# Patient Record
Sex: Female | Born: 1960 | Race: White | Hispanic: No | Marital: Married | Smoking: Never smoker
Health system: Southern US, Community
[De-identification: ages and names within clinical notes are randomized; demographics above are authoritative.]

## PROBLEM LIST (undated history)

## (undated) DIAGNOSIS — B029 Zoster without complications: Secondary | ICD-10-CM

## (undated) DIAGNOSIS — I639 Cerebral infarction, unspecified: Secondary | ICD-10-CM

## (undated) DIAGNOSIS — F329 Major depressive disorder, single episode, unspecified: Secondary | ICD-10-CM

## (undated) DIAGNOSIS — F419 Anxiety disorder, unspecified: Secondary | ICD-10-CM

## (undated) DIAGNOSIS — K115 Sialolithiasis: Secondary | ICD-10-CM

## (undated) DIAGNOSIS — K219 Gastro-esophageal reflux disease without esophagitis: Secondary | ICD-10-CM

## (undated) DIAGNOSIS — R200 Anesthesia of skin: Secondary | ICD-10-CM

## (undated) DIAGNOSIS — F32A Depression, unspecified: Secondary | ICD-10-CM

## (undated) DIAGNOSIS — T7840XA Allergy, unspecified, initial encounter: Secondary | ICD-10-CM

## (undated) DIAGNOSIS — E079 Disorder of thyroid, unspecified: Secondary | ICD-10-CM

## (undated) HISTORY — DX: Anesthesia of skin: R20.0

## (undated) HISTORY — DX: Sialolithiasis: K11.5

## (undated) HISTORY — DX: Cerebral infarction, unspecified: I63.9

## (undated) HISTORY — DX: Depression, unspecified: F32.A

## (undated) HISTORY — DX: Zoster without complications: B02.9

## (undated) HISTORY — DX: Gastro-esophageal reflux disease without esophagitis: K21.9

## (undated) HISTORY — DX: Allergy, unspecified, initial encounter: T78.40XA

## (undated) HISTORY — PX: TOOTH EXTRACTION: SUR596

## (undated) HISTORY — DX: Disorder of thyroid, unspecified: E07.9

## (undated) HISTORY — DX: Major depressive disorder, single episode, unspecified: F32.9

## (undated) HISTORY — DX: Anxiety disorder, unspecified: F41.9

---

## 1996-07-01 HISTORY — PX: ABDOMINAL HYSTERECTOMY: SHX81

## 2013-12-29 DIAGNOSIS — I639 Cerebral infarction, unspecified: Secondary | ICD-10-CM

## 2013-12-29 DIAGNOSIS — G459 Transient cerebral ischemic attack, unspecified: Secondary | ICD-10-CM

## 2013-12-29 HISTORY — DX: Transient cerebral ischemic attack, unspecified: G45.9

## 2013-12-29 HISTORY — DX: Cerebral infarction, unspecified: I63.9

## 2015-12-30 ENCOUNTER — Encounter (INDEPENDENT_AMBULATORY_CARE_PROVIDER_SITE_OTHER): Payer: Self-pay

## 2015-12-30 ENCOUNTER — Encounter: Payer: Self-pay | Admitting: *Deleted

## 2015-12-30 ENCOUNTER — Encounter: Payer: Self-pay | Admitting: Family Medicine

## 2015-12-30 ENCOUNTER — Ambulatory Visit (INDEPENDENT_AMBULATORY_CARE_PROVIDER_SITE_OTHER): Admitting: Family Medicine

## 2015-12-30 VITALS — BP 129/69 | HR 66 | Temp 97.0°F | Ht 65.75 in | Wt 163.0 lb

## 2015-12-30 DIAGNOSIS — E079 Disorder of thyroid, unspecified: Secondary | ICD-10-CM

## 2015-12-30 DIAGNOSIS — Z0000 Encounter for general adult medical examination without abnormal findings: Secondary | ICD-10-CM

## 2015-12-30 NOTE — Patient Instructions (Signed)
TRY otc    SELSYUM BLUE OR NIZORAL SHAMPOO   ALLEGRA OR ZYRTEC  MIRA LAX, METAMUCIL, OR COLACE

## 2015-12-30 NOTE — Progress Notes (Signed)
Subjective:    Patient ID: Annette Silva, female    DOB: 10-Feb-1961, 55 y.o.   MRN: [id number]  HPI Patient is here today for annual wellness exam and follow up of chronic medical problems which includes thyroid disease. She is a New patient here at our practice.  She moved from [place] about one year ago to be near family here. She is sad about that move having regrets about leaving her former life. The family thing is just not worked out that her hopes. She does have a history of hypothyroidism but has been off of thyroid supplement for some months now this may be related to some of her symptoms such as fatigue and dry scalp.  Fatigue also made the related to poor sleep. She wakes up many times at night and cannot go back to sleep. She becomes tearful when I ask about sadness and depression and the aforementioned move.  She also complains today of some hearing loss and tinnitus. This has been a chronic problem. 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  She complains of cough and thinks it may be related to allergies. She has not tried any antihistamine or other medicines.  Complains of hemorrhoids some occasional slight bleeding and pain she has never been told formally that this is the problem.  Complains of pain in her right elbow and both knees. She does no repetitive motions. Her knees make no creaking or cracking noises with steps and bending.      There are no active problems to display for this patient.  No outpatient encounter prescriptions on file as of 12/30/2015.   No facility-administered encounter medications on file as of 12/30/2015.      Review of Systems  Constitutional: Positive for fatigue.  HENT: Negative.        Tinnitus and hearing loss in both ears  Eyes: Negative.   Respiratory: Positive for cough (? allergies / pollen -- non productive).   Cardiovascular: Negative.   Gastrointestinal: Positive for rectal pain.  Endocrine:  Negative.   Genitourinary: Negative.   Musculoskeletal: Positive for arthralgias (right elbow pain  and   bilateral knee pain with bending).  Skin: Negative.        Dry patch - scalp  Allergic/Immunologic: Negative.   Neurological: Negative.   Hematological: Negative.   Psychiatric/Behavioral: Negative.        Objective:   Physical Exam  Constitutional: She is oriented to person, place, and time. She appears well-developed and well-nourished.  HENT:  Head: Normocephalic.  Right Ear: External ear normal.  Left Ear: External ear normal.  Mouth/Throat: Oropharynx is clear and moist.  Eyes: Pupils are equal, round, and reactive to light.  Neck: Normal range of motion.  Cardiovascular: Normal rate, regular rhythm and normal heart sounds.   Pulmonary/Chest: Effort normal and breath sounds normal.  Abdominal: Soft. Bowel sounds are normal.  Genitourinary:  Rectal exam does show some small external hemorrhoids of the 6:00 position  Musculoskeletal:  There is tenderness in the right elbow over the lateral epicondyles.  Knees are tender with movement of patella suggestive of a patellofemoral syndrome  Neurological: She is alert and oriented to person, place, and time. She displays abnormal reflex.  Skin:  Small patch and scalp in the frontal area consistent with seborrheic dermatitis  Psychiatric: She has a normal mood and affect. Her behavior is normal.   BP 129/69 mmHg  Pulse 66  Temp(Src) 97 F (36.1  C) (Oral)  Ht 5' 5.75" (1.67 m)  Wt 163 lb (73.936 kg)  BMI 26.51 kg/m2  LMP 07/01/1996        Assessment & Plan:  1. Annual physical exam Exam completed. Will check thyroid as well as CBC CMP and lipids. For the fatigue have asked her to think about a sleeping aid which might include Xanax, which might also help the tinnitus. Protocol 5 ask her to try OTC antihistamine such as fexofenadine or loratadine. For hemorrhoids I suggested MiraLAX or Metamucil and Anusol  cream. For the elbow I suggested a tennis elbow strap and Aleve. Aleve may also help the patellofemoral syndrome. For the dry patch in the scalp I suggested Selsun Blue or Nizoral shampoo - Lipid panel - CMP14+EGFR - TSH  2. Thyroid disease We'll probably restart her thyroid hormone supplement depending on  test result - TSH  Wardell Honour MD

## 2015-12-31 LAB — CBC WITH DIFFERENTIAL/PLATELET
BASOS: 1 %
Basophils Absolute: 0 10*3/uL (ref 0.0–0.2)
EOS (ABSOLUTE): 0.1 10*3/uL (ref 0.0–0.4)
EOS: 1 %
HEMATOCRIT: 40.2 % (ref 34.0–46.6)
Hemoglobin: 13.4 g/dL (ref 11.1–15.9)
IMMATURE GRANS (ABS): 0 10*3/uL (ref 0.0–0.1)
IMMATURE GRANULOCYTES: 0 %
Lymphocytes Absolute: 2 10*3/uL (ref 0.7–3.1)
Lymphs: 43 %
MCH: 28.3 pg (ref 26.6–33.0)
MCHC: 33.3 g/dL (ref 31.5–35.7)
MCV: 85 fL (ref 79–97)
MONOS ABS: 0.3 10*3/uL (ref 0.1–0.9)
Monocytes: 6 %
NEUTROS ABS: 2.2 10*3/uL (ref 1.4–7.0)
NEUTROS PCT: 49 %
PLATELETS: 217 10*3/uL (ref 150–379)
RBC: 4.73 x10E6/uL (ref 3.77–5.28)
RDW: 14.3 % (ref 12.3–15.4)
WBC: 4.6 10*3/uL (ref 3.4–10.8)

## 2015-12-31 LAB — CMP14+EGFR
A/G RATIO: 2 (ref 1.2–2.2)
ALBUMIN: 4.5 g/dL (ref 3.5–5.5)
ALT: 9 IU/L (ref 0–32)
AST: 14 IU/L (ref 0–40)
Alkaline Phosphatase: 79 IU/L (ref 39–117)
BILIRUBIN TOTAL: 0.4 mg/dL (ref 0.0–1.2)
BUN / CREAT RATIO: 16 (ref 9–23)
BUN: 12 mg/dL (ref 6–24)
CALCIUM: 9.2 mg/dL (ref 8.7–10.2)
CHLORIDE: 100 mmol/L (ref 96–106)
CO2: 27 mmol/L (ref 18–29)
Creatinine, Ser: 0.74 mg/dL (ref 0.57–1.00)
GFR, EST AFRICAN AMERICAN: 105 mL/min/{1.73_m2} (ref >59)
GFR, EST NON AFRICAN AMERICAN: 91 mL/min/{1.73_m2} (ref >59)
GLOBULIN, TOTAL: 2.2 g/dL (ref 1.5–4.5)
Glucose: 84 mg/dL (ref 65–99)
POTASSIUM: 3.9 mmol/L (ref 3.5–5.2)
SODIUM: 142 mmol/L (ref 134–144)
TOTAL PROTEIN: 6.7 g/dL (ref 6.0–8.5)

## 2015-12-31 LAB — LIPID PANEL
CHOL/HDL RATIO: 2.4 ratio (ref 0.0–4.4)
CHOLESTEROL TOTAL: 204 mg/dL — AB (ref 100–199)
HDL: 84 mg/dL (ref >39)
LDL Calculated: 110 mg/dL — ABNORMAL HIGH (ref 0–99)
TRIGLYCERIDES: 49 mg/dL (ref 0–149)
VLDL Cholesterol Cal: 10 mg/dL (ref 5–40)

## 2015-12-31 LAB — TSH: TSH: 10.51 u[IU]/mL — ABNORMAL HIGH (ref 0.450–4.500)

## 2016-01-01 MED ORDER — LEVOTHYROXINE SODIUM 50 MCG PO TABS: 50.0000 ug | ORAL_TABLET | Freq: Every day | ORAL | Status: DC

## 2016-01-01 NOTE — Addendum Note (Signed)
Addended by: Shelbie Ammons on: 01/01/2016 03:41 PM   Modules accepted: Orders, SmartSet

## 2016-02-20 ENCOUNTER — Ambulatory Visit (INDEPENDENT_AMBULATORY_CARE_PROVIDER_SITE_OTHER): Admitting: Family

## 2016-02-20 ENCOUNTER — Encounter: Payer: Self-pay | Admitting: Family

## 2016-02-20 VITALS — BP 111/73 | HR 59 | Temp 98.0°F | Ht 65.75 in | Wt 161.8 lb

## 2016-02-20 DIAGNOSIS — D229 Melanocytic nevi, unspecified: Secondary | ICD-10-CM | POA: Diagnosis not present

## 2016-02-20 DIAGNOSIS — L821 Other seborrheic keratosis: Secondary | ICD-10-CM

## 2016-02-20 DIAGNOSIS — E663 Overweight: Secondary | ICD-10-CM | POA: Diagnosis not present

## 2016-02-20 NOTE — Patient Instructions (Signed)
Seborrheic Keratosis Seborrheic keratosis is a common, noncancerous (benign) skin growth. This condition causes waxy, rough, tan, brown, or black spots to appear on the skin. These skin growths can be flat or raised. CAUSES The cause of this condition is not known. RISK FACTORS This condition is more likely to develop in:  People who have a family history of seborrheic keratosis.  People who are 50 or older.  People who are pregnant.  People who have had estrogen replacement therapy. SYMPTOMS This condition often occurs on the face, chest, shoulders, back, or other areas. These growths:  Are usually painless, but may become irritated and itchy.  Can be yellow, brown, black, or other colors.  Are slightly raised or have a flat surface.  Are sometimes rough or wart-like in texture.  Are often waxy on the surface.  Are round or oval-shaped.  Sometimes look like they are "stuck on."  Often occur in groups, but may occur as a single growth. DIAGNOSIS This condition is diagnosed with a medical history and physical exam. A sample of the growth may be tested (skin biopsy). You may need to see a skin specialist (dermatologist). TREATMENT Treatment is not usually needed for this condition, unless the growths are irritated or are often bleeding. You may also choose to have the growths removed if you do not like their appearance. Most commonly, these growths are treated with a procedure in which liquid nitrogen is applied to "freeze" off the growth (cryosurgery). They may also be burned off with electricity or cut off. HOME CARE INSTRUCTIONS  Watch your growth for any changes.  Keep all follow-up visits as told by your health care provider. This is important.  Do not scratch or pick at the growth or growths. This can cause them to become irritated or infected. SEEK MEDICAL CARE IF:  You suddenly have many new growths.  Your growth bleeds, itches, or hurts.  Your growth suddenly  becomes larger or changes color.   This information is not intended to replace advice given to you by your health care provider. Make sure you discuss any questions you have with your health care provider.   Document Released: 09/19/2010 Document Revised: 05/08/2015 Document Reviewed: 01/02/2015 Elsevier Interactive Patient Education 2016 [employer]  

## 2016-02-20 NOTE — Progress Notes (Signed)
   Subjective:    Patient ID: Annette Silva, female    DOB: Nov 05, 1960, 55 y.o.   MRN: [id number]  HPI Pt presents to the office today for "brown spots' that she noticed over the last year. PT states they have become larger. Pt states she noticed one appear on her left side last week. PT denies any history of skin cancer. Pt denies any headache, palpitations, SOB, or edema at this time.     Review of Systems  Constitutional: Negative.   HENT: Negative.   Eyes: Negative.   Respiratory: Negative.  Negative for shortness of breath.   Cardiovascular: Negative.  Negative for palpitations.  Gastrointestinal: Negative.   Endocrine: Negative.   Genitourinary: Negative.   Musculoskeletal: Negative.   Neurological: Negative.  Negative for headaches.  Hematological: Negative.   Psychiatric/Behavioral: Negative.   All other systems reviewed and are negative.      Objective:   Physical Exam  Constitutional: She is oriented to person, place, and time. She appears well-developed and well-nourished. No distress.  HENT:  Head: Normocephalic and atraumatic.  Eyes: Pupils are equal, round, and reactive to light.  Neck: Normal range of motion. Neck supple. No thyromegaly present.  Cardiovascular: Normal rate, regular rhythm, normal heart sounds and intact distal pulses.   No murmur heard. Pulmonary/Chest: Effort normal and breath sounds normal. No respiratory distress. She has no wheezes.  Abdominal: Soft. Bowel sounds are normal. She exhibits no distension. There is no tenderness.  Musculoskeletal: Normal range of motion. She exhibits no edema or tenderness.  Neurological: She is alert and oriented to person, place, and time.  Skin: Skin is warm and dry.  Mole present on left lateral thigh,Seborrheic Keratosis lesion present on left abdominal area   Psychiatric: She has a normal mood and affect. Her behavior is normal. Judgment and thought content normal.  Vitals reviewed.     BP 111/73  mmHg  Pulse 59  Temp(Src) 98 F (36.7 C) (Oral)  Ht 5' 5.75" (1.67 m)  Wt 161 lb 12.8 oz (73.392 kg)  BMI 26.32 kg/m2  LMP 07/01/1996     Assessment & Plan:  1. Overweight (BMI 25.0-29.9)  2. Seborrheic keratosis  3. Benign mole  Pt states she would like to hold off on removing these at this time.  Discussed importance of watching and report any change in color or shape Do not pick at  RTO prn   Evelina Dun, FNP

## 2016-02-25 ENCOUNTER — Other Ambulatory Visit

## 2016-02-26 ENCOUNTER — Other Ambulatory Visit: Payer: Self-pay | Admitting: *Deleted

## 2016-02-26 ENCOUNTER — Other Ambulatory Visit

## 2016-02-26 DIAGNOSIS — E079 Disorder of thyroid, unspecified: Secondary | ICD-10-CM

## 2016-02-27 LAB — TSH: TSH: 3.7 u[IU]/mL (ref 0.450–4.500)

## 2016-04-20 ENCOUNTER — Encounter: Payer: Self-pay | Admitting: Pediatrics

## 2016-04-20 ENCOUNTER — Ambulatory Visit (INDEPENDENT_AMBULATORY_CARE_PROVIDER_SITE_OTHER): Admitting: Pediatrics

## 2016-04-20 DIAGNOSIS — N941 Unspecified dyspareunia: Secondary | ICD-10-CM

## 2016-04-20 DIAGNOSIS — N898 Other specified noninflammatory disorders of vagina: Secondary | ICD-10-CM

## 2016-04-20 DIAGNOSIS — E039 Hypothyroidism, unspecified: Secondary | ICD-10-CM

## 2016-04-20 DIAGNOSIS — Z01419 Encounter for gynecological examination (general) (routine) without abnormal findings: Secondary | ICD-10-CM

## 2016-04-20 LAB — WET PREP FOR TRICH, YEAST, CLUE
CLUE CELL EXAM: NEGATIVE
Trichomonas Exam: NEGATIVE
YEAST EXAM: NEGATIVE

## 2016-04-20 MED ORDER — LEVOTHYROXINE SODIUM 50 MCG PO TABS: 50.0000 ug | ORAL_TABLET | Freq: Every day | ORAL | 3 refills | Status: DC

## 2016-04-20 NOTE — Progress Notes (Signed)
  Subjective:   Patient ID: Annette Silva, female    DOB: 1961/06/24, 55 y.o.   MRN: [id number] CC: Gynecologic Exam  HPI: Annette Silva is a 55 y.o. female presenting for Gynecologic Exam  Partial hysterectomy for heavy bleeding in 1990s Has had pap smears since then, is not sure if she had cervix removed No h/o abnormal paps Pain with intercourse Last sexual activity at least 5 years ago, stopped because of pain No vaginal discharge, no fevers No odors, no bleeding  Relevant past medical, surgical, family and social history reviewed. Allergies and medications reviewed and updated. History  Smoking Status  . Never Smoker  Smokeless Tobacco  . Never Used   Family History  Problem Relation Age of Onset  . Stroke Mother     passed at age 87  . Arthritis Mother   . COPD Mother   . Depression Mother   . Diabetes Mother   . Hearing loss Mother   . Heart disease Mother   . Hypertension Mother   . Cancer Father     bone passed at 7    ROS: Per HPI   Objective:    BP (P) 106/63 (BP Location: Left Arm, Patient Position: Sitting, Cuff Size: Normal)   Pulse (P) 68   Temp (P) 99.1 F (37.3 C) (Oral)   Ht (P) 5' 5.75" (1.67 m)   Wt (P) 159 lb 3.2 oz (72.2 kg)   LMP 07/01/1996   BMI (P) 25.89 kg/m   Wt Readings from Last 3 Encounters:  04/20/16 (P) 159 lb 3.2 oz (72.2 kg)  02/20/16 161 lb 12.8 oz (73.4 kg)  12/30/15 163 lb (73.9 kg)   Gen: NAD, alert, cooperative with exam, NCAT EYES: EOMI, no conjunctival injection, or no icterus CV: NRRR, normal S1/S2, no murmur, distal pulses 2+ b/l Resp: CTABL, no wheezes, normal WOB Abd: +BS, soft, NTND. no guarding or organomegaly Ext: No edema, warm Breast: normal exam Neuro: Alert and oriented, strength equal b/l UE and LE, coordination grossly normal MSK: normal muscle bulk GU: thick yellow mucus present at end fo vaginal vault. No cervix present  Assessment & Plan:  Annette Silva was seen today for gynecologic exam.  Diagnoses  and all orders for this visit:  Encounter for gynecological examination No cervix present, does not need further pap smears for screening  Dyspareunia Ongoing several years Discussed options including OTC replens, vagisil, vaginal estrogen Pt not interested now, will let me know  Hypothyroidism, unspecified hypothyroidism type TSH appropraite, repeat in 9 mo Continue below -     levothyroxine (SYNTHROID, LEVOTHROID) 50 MCG tablet; Take 1 tablet (50 mcg total) by mouth daily before breakfast.  Vaginal discharge Yellow discharge vaginal vault No symptoms Not sexually active now -     [place], YEAST, CLUE  Mammogram: scheduled later this week  Follow up plan: Return in about 9 months (around 01/29/2017). Assunta Found, MD [place]

## 2016-04-22 ENCOUNTER — Encounter: Admitting: *Deleted

## 2016-10-20 DIAGNOSIS — K115 Sialolithiasis: Secondary | ICD-10-CM | POA: Insufficient documentation

## 2016-10-20 DIAGNOSIS — H9113 Presbycusis, bilateral: Secondary | ICD-10-CM | POA: Insufficient documentation

## 2016-10-20 DIAGNOSIS — H9313 Tinnitus, bilateral: Secondary | ICD-10-CM | POA: Insufficient documentation

## 2017-02-12 ENCOUNTER — Encounter: Payer: Self-pay | Admitting: Family Medicine

## 2017-02-12 ENCOUNTER — Ambulatory Visit (INDEPENDENT_AMBULATORY_CARE_PROVIDER_SITE_OTHER): Admitting: Family Medicine

## 2017-02-12 VITALS — BP 103/78 | HR 65 | Temp 98.5°F | Ht 65.75 in | Wt 159.0 lb

## 2017-02-12 DIAGNOSIS — R0789 Other chest pain: Secondary | ICD-10-CM | POA: Diagnosis not present

## 2017-02-12 DIAGNOSIS — J3089 Other allergic rhinitis: Secondary | ICD-10-CM

## 2017-02-12 NOTE — Progress Notes (Signed)
BP 103/78   Pulse 65   Temp 98.5 F (36.9 C) (Oral)   Ht 5' 5.75" (1.67 m)   Wt 159 lb (72.1 kg)   LMP 07/01/1996   BMI 25.86 kg/m    Subjective:    Patient ID: Annette Silva, female    DOB: 19-Oct-1960, 56 y.o.   MRN: [id number]  HPI: Annette Silva is a 56 y.o. female presenting on 02/12/2017 for Hoarse and Cough (first started getting sick on 5/21, started Alka Seltzer plus cold, thought she was improving, but woke up like this today)   HPI  Hoarseness and drainage Patient started having a little bit of congestion last night and then woke up with a hoarse throat this morning. She had a cough on 5/21 but took medication and that went away for good time but then last night she started getting ill again. She started feeling not well last night and then this morning she started in with a hoarseness in her throat. She denies any sore throat or cough. She has been having a little bit of drainage and sinus congestion. She denies any fevers or chills or shortness of breath or wheezing. She has not used anything since yesterday for this yet. She also says that since this morning she has been complaining of some chest tightness and pressure. She denies any shortness of breath along with it. The chest pressure has been there since early this morning and it is now 1:20 in the afternoon.  Relevant past medical, surgical, family and social history reviewed and updated as indicated. Interim medical history since our last visit reviewed. Allergies and medications reviewed and updated.  Review of Systems  Constitutional: Negative for chills and fever.  HENT: Positive for congestion, postnasal drip, sinus pressure, sneezing and voice change. Negative for ear discharge, ear pain, rhinorrhea and sore throat.   Eyes: Negative for pain, redness and visual disturbance.  Respiratory: Positive for cough. Negative for chest tightness and shortness of breath.   Cardiovascular: Positive for chest pain. Negative  for leg swelling.  Genitourinary: Negative for difficulty urinating and dysuria.  Musculoskeletal: Negative for back pain and gait problem.  Skin: Negative for rash.  Neurological: Negative for light-headedness and headaches.  Psychiatric/Behavioral: Negative for agitation and behavioral problems.  All other systems reviewed and are negative.   Per HPI unless specifically indicated above        Objective:    BP 103/78   Pulse 65   Temp 98.5 F (36.9 C) (Oral)   Ht 5' 5.75" (1.67 m)   Wt 159 lb (72.1 kg)   LMP 07/01/1996   BMI 25.86 kg/m   Wt Readings from Last 3 Encounters:  02/12/17 159 lb (72.1 kg)  04/20/16 (P) 159 lb 3.2 oz (72.2 kg)  02/20/16 161 lb 12.8 oz (73.4 kg)    Physical Exam  Constitutional: She is oriented to person, place, and time. She appears well-developed and well-nourished. No distress.  HENT:  Right Ear: Tympanic membrane, external ear and ear canal normal.  Left Ear: Tympanic membrane, external ear and ear canal normal.  Nose: Mucosal edema and rhinorrhea present. No epistaxis. Right sinus exhibits no maxillary sinus tenderness and no frontal sinus tenderness. Left sinus exhibits no maxillary sinus tenderness and no frontal sinus tenderness.  Mouth/Throat: Uvula is midline and mucous membranes are normal. Posterior oropharyngeal edema and posterior oropharyngeal erythema present. No oropharyngeal exudate or tonsillar abscesses.  Eyes: Conjunctivae are normal.  Neck: Neck supple. No thyromegaly present.  Cardiovascular: Normal rate, regular rhythm, normal heart sounds and intact distal pulses.   No murmur heard. Pulmonary/Chest: Effort normal and breath sounds normal. No respiratory distress. She has no wheezes. She has no rales.  Musculoskeletal: Normal range of motion.  Lymphadenopathy:    She has no cervical adenopathy.  Neurological: She is alert and oriented to person, place, and time. Coordination normal.  Skin: Skin is warm and dry. No rash  noted. She is not diaphoretic.  Psychiatric: She has a normal mood and affect. Her behavior is normal.  Nursing note and vitals reviewed.   EKG: Normal sinus rhythm    Assessment & Plan:   Problem List Items Addressed This Visit    None    Visit Diagnoses    Chest pressure    -  Primary   Relevant Orders   EKG 12-Lead   Seasonal allergic rhinitis due to other allergic trigger       Mucinex and Flonase and nasal saline spray       Follow up plan: Return if symptoms worsen or fail to improve.  Counseling provided for all of the vaccine components Orders Placed This Encounter  Procedures  . EKG 12-Lead    Caryl Pina, MD [facility] 02/12/2017, 1:26 PM

## 2017-03-25 ENCOUNTER — Ambulatory Visit (INDEPENDENT_AMBULATORY_CARE_PROVIDER_SITE_OTHER): Admitting: Pediatrics

## 2017-03-25 ENCOUNTER — Encounter: Payer: Self-pay | Admitting: Pediatrics

## 2017-03-25 VITALS — BP 118/75 | HR 65 | Temp 98.1°F | Ht 65.75 in | Wt 159.6 lb

## 2017-03-25 DIAGNOSIS — Z0000 Encounter for general adult medical examination without abnormal findings: Secondary | ICD-10-CM

## 2017-03-25 DIAGNOSIS — Z780 Asymptomatic menopausal state: Secondary | ICD-10-CM

## 2017-03-25 DIAGNOSIS — E039 Hypothyroidism, unspecified: Secondary | ICD-10-CM

## 2017-03-25 NOTE — Progress Notes (Signed)
  Subjective:   Patient ID: Annette Silva, female    DOB: 1960/12/07, 56 y.o.   MRN: [id number] CC: annual HPI: Annette Silva is a 56 y.o. female presenting for Jaw Pain; Ear Pain; and annual  Had tooth pain starting 3 weeks ago Had tooth extraction last week Tooth had broken under the gum Now tooth pain improving No further ear pain Concerned about her bones because of broken tooth  Last bone density in Nov 2013 in [place] hx in father of bone cancer in 1975/11/18, died the day before starting chemotherapy Mother with hip fracture in her 72s  No fevers Never been on steroids for long periods of time Follows with chiropractor for her back pain, has beenstable  Relevant past medical, surgical, family and social history reviewed. Allergies and medications reviewed and updated. History  Smoking Status  . Never Smoker  Smokeless Tobacco  . Never Used   ROS: All systems negative other than what is in HPI  Objective:    BP 118/75   Pulse 65   Temp 98.1 F (36.7 C) (Oral)   Ht 5' 5.75" (1.67 m)   Wt 159 lb 9.6 oz (72.4 kg)   LMP 07/01/1996   BMI 25.96 kg/m   Wt Readings from Last 3 Encounters:  03/25/17 159 lb 9.6 oz (72.4 kg)  02/12/17 159 lb (72.1 kg)  04/20/16 (P) 159 lb 3.2 oz (72.2 kg)    Gen: NAD, alert, cooperative with exam, NCAT EYES: EOMI, no conjunctival injection, or no icterus ENT:  TMs pearly gray b/l, OP without erythema LYMPH: no cervical LAD CV: NRRR, normal S1/S2, no murmur, distal pulses 2+ b/l Resp: CTABL, no wheezes, normal WOB Abd: +BS, soft, NTND. no guarding or organomegaly Ext: No edema, warm Neuro: Alert and oriented, strength equal b/l UE and LE, coordination grossly normal MSK: normal muscle bulk  Declined breast exam  Assessment & Plan:  Christol was seen today for annual. Jaw pain and ear pain resolved after recent tooth extraction Have been healing well  Diagnoses and all orders for this visit:  Encounter for preventive  care mammo--doine last year Colonoscopy--done in November 17, 2009 in [place], was normal per pt Pap--s/p hysterectomy -     CBC with Differential/Platelet -     BMP8+EGFR  Hypothyroidism, unspecified type -     TSH  Post-menopausal -     DG [facility] DEXA   Follow up plan: 1 yr Assunta Found, MD [place]

## 2017-03-26 LAB — BMP8+EGFR
BUN/Creatinine Ratio: 12 (ref 9–23)
BUN: 10 mg/dL (ref 6–24)
CALCIUM: 9.5 mg/dL (ref 8.7–10.2)
CHLORIDE: 103 mmol/L (ref 96–106)
CO2: 26 mmol/L (ref 20–29)
Creatinine, Ser: 0.85 mg/dL (ref 0.57–1.00)
GFR, EST AFRICAN AMERICAN: 87 mL/min/{1.73_m2} (ref >59)
GFR, EST NON AFRICAN AMERICAN: 76 mL/min/{1.73_m2} (ref >59)
Glucose: 99 mg/dL (ref 65–99)
POTASSIUM: 4.1 mmol/L (ref 3.5–5.2)
SODIUM: 142 mmol/L (ref 134–144)

## 2017-03-26 LAB — CBC WITH DIFFERENTIAL/PLATELET
BASOS: 0 %
Basophils Absolute: 0 10*3/uL (ref 0.0–0.2)
EOS (ABSOLUTE): 0.1 10*3/uL (ref 0.0–0.4)
Eos: 1 %
Hematocrit: 40.8 % (ref 34.0–46.6)
Hemoglobin: 13.8 g/dL (ref 11.1–15.9)
IMMATURE GRANULOCYTES: 0 %
Immature Grans (Abs): 0 10*3/uL (ref 0.0–0.1)
LYMPHS ABS: 1.6 10*3/uL (ref 0.7–3.1)
Lymphs: 35 %
MCH: 28.6 pg (ref 26.6–33.0)
MCHC: 33.8 g/dL (ref 31.5–35.7)
MCV: 85 fL (ref 79–97)
MONOS ABS: 0.3 10*3/uL (ref 0.1–0.9)
Monocytes: 7 %
Neutrophils Absolute: 2.6 10*3/uL (ref 1.4–7.0)
Neutrophils: 57 %
PLATELETS: 261 10*3/uL (ref 150–379)
RBC: 4.83 x10E6/uL (ref 3.77–5.28)
RDW: 14 % (ref 12.3–15.4)
WBC: 4.6 10*3/uL (ref 3.4–10.8)

## 2017-03-26 LAB — TSH: TSH: 4.16 u[IU]/mL (ref 0.450–4.500)

## 2017-03-28 ENCOUNTER — Encounter: Payer: Self-pay | Admitting: Pediatrics

## 2017-03-29 MED ORDER — LEVOTHYROXINE SODIUM 50 MCG PO TABS: 50.0000 ug | ORAL_TABLET | Freq: Every day | ORAL | 1 refills | Status: DC

## 2017-04-01 ENCOUNTER — Ambulatory Visit (INDEPENDENT_AMBULATORY_CARE_PROVIDER_SITE_OTHER)

## 2017-04-01 DIAGNOSIS — Z780 Asymptomatic menopausal state: Secondary | ICD-10-CM

## 2017-05-17 ENCOUNTER — Ambulatory Visit (INDEPENDENT_AMBULATORY_CARE_PROVIDER_SITE_OTHER): Admitting: Family Medicine

## 2017-05-17 ENCOUNTER — Encounter: Payer: Self-pay | Admitting: Family Medicine

## 2017-05-17 VITALS — BP 121/81 | HR 65 | Temp 97.3°F | Ht 65.75 in | Wt 162.4 lb

## 2017-05-17 DIAGNOSIS — B029 Zoster without complications: Secondary | ICD-10-CM | POA: Diagnosis not present

## 2017-05-17 MED ORDER — VALACYCLOVIR HCL 1 G PO TABS: 1000.0000 mg | ORAL_TABLET | Freq: Three times a day (TID) | ORAL | 0 refills | Status: DC

## 2017-05-17 NOTE — Progress Notes (Signed)
   HPI  Patient presents today here with painful red rash.  Patient noticed some irritation and burning of her left neck last night after she got home from church. She began to feel the area and noticed some bumps developing.  She states that she has had "internal shingles" previously and this is a characteristic pain of that. She describes burning and tingling type pain and worsening rash of the left posterior neck. Itching and redness seems to go down the left side of her spine down to mid thoracic back  No Fever, chills, sweats.   PMH: Smoking status noted ROS: Per HPI  Objective: BP 121/81   Pulse 65   Temp (!) 97.3 F (36.3 C) (Oral)   Ht 5' 5.75" (1.67 m)   Wt 162 lb 6.4 oz (73.7 kg)   LMP 07/01/1996   BMI 26.41 kg/m  Gen: NAD, alert, cooperative with exam HEENT: NCAT CV: RRR, good S1/S2, no murmur Resp: CTABL, no wheezes, non-labored Ext: No edema, warm Neuro: Alert and oriented, No gross deficits Skin:  Left posterior neck with thin line of erythematous papules developing starting of the left-side of midline extending over about 2 inches, no lesions down the back.  Assessment and plan:  # shingles Treat with valtrex, discussed usual course of illness.  RTC with any concerns   Meds ordered this encounter  Medications  . Calcium Carbonate-Vit D-Min (CALCIUM 1200 PO)    Sig: Take by mouth.  . Cholecalciferol (D3 ADULT PO)    Sig: Take 800 mg by mouth daily.  . valACYclovir (VALTREX) 1000 MG tablet    Sig: Take 1 tablet (1,000 mg total) by mouth 3 (three) times daily.    Dispense:  21 tablet    Refill:  0    Laroy Apple, MD [facility] 05/17/2017, 3:05 PM

## 2017-05-17 NOTE — Patient Instructions (Signed)
Great to meet you!   Shingles Shingles, which is also known as herpes zoster, is an infection that causes a painful skin rash and fluid-filled blisters. Shingles is not related to genital herpes, which is a sexually transmitted infection. Shingles only develops in people who:  Have had chickenpox.  Have received the chickenpox vaccine. (This is rare.)  What are the causes? Shingles is caused by varicella-zoster virus (VZV). This is the same virus that causes chickenpox. After exposure to VZV, the virus stays in the body in an inactive (dormant) state. Shingles develops if the virus reactivates. This can happen many years after the initial exposure to VZV. It is not known what causes this virus to reactivate. What increases the risk? People who have had chickenpox or received the chickenpox vaccine are at risk for shingles. Infection is more common in people who:  Are older than age 11.  Have a weakened defense (immune) system, such as those with HIV, AIDS, or cancer.  Are taking medicines that weaken the immune system, such as transplant medicines.  Are under great stress.  What are the signs or symptoms? Early symptoms of this condition include itching, tingling, and pain in an area on your skin. Pain may be described as burning, stabbing, or throbbing. A few days or weeks after symptoms start, a painful red rash appears, usually on one side of the body in a bandlike or beltlike pattern. The rash eventually turns into fluid-filled blisters that break open, scab over, and dry up in about 2-3 weeks. At any time during the infection, you may also develop:  A fever.  Chills.  A headache.  An upset stomach.  How is this diagnosed? This condition is diagnosed with a skin exam. Sometimes, skin or fluid samples are taken from the blisters before a diagnosis is made. These samples are examined under a microscope or sent to a lab for testing. How is this treated? There is no specific  cure for this condition. Your health care provider will probably prescribe medicines to help you manage pain, recover more quickly, and avoid long-term problems. Medicines may include:  Antiviral drugs.  Anti-inflammatory drugs.  Pain medicines.  If the area involved is on your face, you may be referred to a specialist, such as an eye doctor (ophthalmologist) or an ear, nose, and throat (ENT) doctor to help you avoid eye problems, chronic pain, or disability. Follow these instructions at home: Medicines  Take medicines only as directed by your health care provider.  Apply an anti-itch or numbing cream to the affected area as directed by your health care provider. Blister and Rash Care  Take a cool bath or apply cool compresses to the area of the rash or blisters as directed by your health care provider. This may help with pain and itching.  Keep your rash covered with a loose bandage (dressing). Wear loose-fitting clothing to help ease the pain of material rubbing against the rash.  Keep your rash and blisters clean with mild soap and cool water or as directed by your health care provider.  Check your rash every day for signs of infection. These include redness, swelling, and pain that lasts or increases.  Do not pick your blisters.  Do not scratch your rash. General instructions  Rest as directed by your health care provider.  Keep all follow-up visits as directed by your health care provider. This is important.  Until your blisters scab over, your infection can cause chickenpox in people who have  never had it or been vaccinated against it. To prevent this from happening, avoid contact with other people, especially: ? Babies. ? Pregnant women. ? Children who have eczema. ? Elderly people who have transplants. ? People who have chronic illnesses, such as leukemia or AIDS. Contact a health care provider if:  Your pain is not relieved with prescribed medicines.  Your pain  does not get better after the rash heals.  Your rash looks infected. Signs of infection include redness, swelling, and pain that lasts or increases. Get help right away if:  The rash is on your face or nose.  You have facial pain, pain around your eye area, or loss of feeling on one side of your face.  You have ear pain or you have ringing in your ear.  You have loss of taste.  Your condition gets worse. This information is not intended to replace advice given to you by your health care provider. Make sure you discuss any questions you have with your health care provider. Document Released: 08/17/2005 Document Revised: 04/12/2016 Document Reviewed: 06/28/2014 Elsevier Interactive Patient Education  2017 [employer].

## 2017-07-21 ENCOUNTER — Telehealth: Payer: Self-pay | Admitting: Pediatrics

## 2017-07-21 ENCOUNTER — Ambulatory Visit (INDEPENDENT_AMBULATORY_CARE_PROVIDER_SITE_OTHER): Admitting: Pediatrics

## 2017-07-21 ENCOUNTER — Encounter: Payer: Self-pay | Admitting: Pediatrics

## 2017-07-21 VITALS — BP 112/73 | HR 64 | Temp 98.5°F | Ht 65.75 in | Wt 156.0 lb

## 2017-07-21 DIAGNOSIS — R238 Other skin changes: Secondary | ICD-10-CM | POA: Diagnosis not present

## 2017-07-21 DIAGNOSIS — R233 Spontaneous ecchymoses: Secondary | ICD-10-CM

## 2017-07-21 DIAGNOSIS — N644 Mastodynia: Secondary | ICD-10-CM

## 2017-07-21 DIAGNOSIS — R5383 Other fatigue: Secondary | ICD-10-CM | POA: Diagnosis not present

## 2017-07-21 DIAGNOSIS — E039 Hypothyroidism, unspecified: Secondary | ICD-10-CM

## 2017-07-21 NOTE — Progress Notes (Signed)
  Subjective:   Patient ID: Annette Silva, female    DOB: 09-29-1960, 56 y.o.   MRN: [id number] CC: Breast Pain and Axillary pain  HPI: Annette Silva is a 56 y.o. female presenting for Breast Pain and Axillary pain  Ongoing for past 2 months has had breast pain "feels like weights under her arm pits" More over past month There most days, not there all the time, lasts for a few hours Not associated with any particular activity When she presses on it, feels sore  Has noticed two new bruises R inner thigh, doesn't remember hitting herself  Appetite down Has lost 16 lbs past few months with changing diet for husband's new diagnosis of diabetes Has been walking more regularly Last year was walking 3 mi at a time Now feels like body in general is heavy and tired after abou ta mile, walking about 2 miles total most days No pain, no shortness of breath Pausing doesn't help                                             Just wants to sit down, gets better after a few minutes Fatigue ongoing since start of new diet when they started walking regularly  Eating breakfast and dinner regularly Sometimes has granola bar lunch Walking usually in afternoon  Relevant past medical, surgical, family and social history reviewed. Allergies and medications reviewed and updated. Social History   Tobacco Use  Smoking Status Never Smoker  Smokeless Tobacco Never Used   ROS: Per HPI   Objective:    BP 112/73   Pulse 64   Temp 98.5 F (36.9 C) (Oral)   Ht 5' 5.75" (1.67 m)   Wt 156 lb (70.8 kg)   LMP 07/01/1996   BMI 25.37 kg/m   Wt Readings from Last 3 Encounters:  07/21/17 156 lb (70.8 kg)  05/17/17 162 lb 6.4 oz (73.7 kg)  03/25/17 159 lb 9.6 oz (72.4 kg)    Gen: NAD, alert, cooperative with exam, NCAT EYES: EOMI, no conjunctival injection, or no icterus ENT:  TMs pearly gray b/l, OP without erythema LYMPH: no cervical, inguinal or axillary LAD CV: NRRR, normal S1/S2, no murmur, distal  pulses 2+ b/l Resp: CTABL, no wheezes, normal WOB Abd: +BS, soft, NTND. no guarding or organomegaly Ext: No edema, warm Neuro: Alert and oriented, strength equal b/l UE and LE, coordination grossly normal MSK: normal muscle bulk Breast: ttp L lateral breast, R axilla Otherwise normal exam Skin: two apprx 1inch bruises, yellowing R inner thigh  Assessment & Plan:  Arisbel was seen today for breast pain and axillary pain.  Diagnoses and all orders for this visit:  Easy bruising Two new bruises R inner thigh, appear to be same age will check labs -     PT AND PTT -     CMP14+EGFR -     CBC with Differential/Platelet  Other fatigue Drastically changed diet recently, now skipping lunch often, walking in the afternoon before dinner Eat three meals daily, trail mix if needed for lunch  Soreness breast Diagnostic mammogram -     MM Digital Diagnostic Bilat; Future  Hypothyroidism, unspecified type Check TSH  Follow up plan: Return in about 2 months (around 09/20/2017). Assunta Found, MD [place]

## 2017-07-22 LAB — CMP14+EGFR
ALBUMIN: 4.5 g/dL (ref 3.5–5.5)
ALT: 15 IU/L (ref 0–32)
AST: 16 IU/L (ref 0–40)
Albumin/Globulin Ratio: 2.3 — ABNORMAL HIGH (ref 1.2–2.2)
Alkaline Phosphatase: 70 IU/L (ref 39–117)
BILIRUBIN TOTAL: 0.3 mg/dL (ref 0.0–1.2)
BUN / CREAT RATIO: 12 (ref 9–23)
BUN: 10 mg/dL (ref 6–24)
CHLORIDE: 104 mmol/L (ref 96–106)
CO2: 27 mmol/L (ref 20–29)
CREATININE: 0.84 mg/dL (ref 0.57–1.00)
Calcium: 9.4 mg/dL (ref 8.7–10.2)
GFR calc non Af Amer: 77 mL/min/{1.73_m2} (ref >59)
GFR, EST AFRICAN AMERICAN: 89 mL/min/{1.73_m2} (ref >59)
GLOBULIN, TOTAL: 2 g/dL (ref 1.5–4.5)
GLUCOSE: 78 mg/dL (ref 65–99)
Potassium: 4.3 mmol/L (ref 3.5–5.2)
Sodium: 146 mmol/L — ABNORMAL HIGH (ref 134–144)
TOTAL PROTEIN: 6.5 g/dL (ref 6.0–8.5)

## 2017-07-22 LAB — CBC WITH DIFFERENTIAL/PLATELET
Basophils Absolute: 0 10*3/uL (ref 0.0–0.2)
Basos: 0 %
EOS (ABSOLUTE): 0 10*3/uL (ref 0.0–0.4)
EOS: 1 %
HEMATOCRIT: 38.5 % (ref 34.0–46.6)
HEMOGLOBIN: 13.5 g/dL (ref 11.1–15.9)
Immature Grans (Abs): 0 10*3/uL (ref 0.0–0.1)
Immature Granulocytes: 0 %
LYMPHS ABS: 1.6 10*3/uL (ref 0.7–3.1)
Lymphs: 52 %
MCH: 29.9 pg (ref 26.6–33.0)
MCHC: 35.1 g/dL (ref 31.5–35.7)
MCV: 85 fL (ref 79–97)
MONOCYTES: 9 %
Monocytes Absolute: 0.3 10*3/uL (ref 0.1–0.9)
NEUTROS ABS: 1.2 10*3/uL — AB (ref 1.4–7.0)
Neutrophils: 38 %
Platelets: 209 10*3/uL (ref 150–379)
RBC: 4.51 x10E6/uL (ref 3.77–5.28)
RDW: 14.1 % (ref 12.3–15.4)
WBC: 3.2 10*3/uL — ABNORMAL LOW (ref 3.4–10.8)

## 2017-07-22 LAB — PT AND PTT
INR: 1 (ref 0.8–1.2)
Prothrombin Time: 10.5 s (ref 9.1–12.0)
aPTT: 27 s (ref 24–33)

## 2017-07-22 LAB — TSH: TSH: 3.51 u[IU]/mL (ref 0.450–4.500)

## 2017-07-23 ENCOUNTER — Other Ambulatory Visit: Payer: Self-pay | Admitting: Pediatrics

## 2017-07-23 DIAGNOSIS — N644 Mastodynia: Secondary | ICD-10-CM

## 2017-07-27 NOTE — Telephone Encounter (Signed)
Pt aware of appointment date/time 

## 2017-07-30 ENCOUNTER — Ambulatory Visit
Admission: RE | Admit: 2017-07-30 | Discharge: 2017-07-30 | Disposition: A | Discharge status: Home / Self Care | Source: Ambulatory Visit | Attending: Pediatrics | Admitting: Pediatrics

## 2017-07-30 DIAGNOSIS — R238 Other skin changes: Secondary | ICD-10-CM

## 2017-07-30 DIAGNOSIS — N644 Mastodynia: Secondary | ICD-10-CM

## 2017-07-30 DIAGNOSIS — R233 Spontaneous ecchymoses: Secondary | ICD-10-CM

## 2017-09-24 ENCOUNTER — Ambulatory Visit (INDEPENDENT_AMBULATORY_CARE_PROVIDER_SITE_OTHER): Admitting: Pediatrics

## 2017-09-24 ENCOUNTER — Encounter: Payer: Self-pay | Admitting: Pediatrics

## 2017-09-24 VITALS — BP 106/70 | HR 60 | Temp 97.4°F | Ht 65.75 in | Wt 147.0 lb

## 2017-09-24 DIAGNOSIS — Z1159 Encounter for screening for other viral diseases: Secondary | ICD-10-CM

## 2017-09-24 DIAGNOSIS — E039 Hypothyroidism, unspecified: Secondary | ICD-10-CM | POA: Diagnosis not present

## 2017-09-24 DIAGNOSIS — R5383 Other fatigue: Secondary | ICD-10-CM | POA: Diagnosis not present

## 2017-09-24 MED ORDER — LEVOTHYROXINE SODIUM 50 MCG PO TABS: 50.0000 ug | ORAL_TABLET | Freq: Every day | ORAL | 3 refills | Status: DC

## 2017-09-24 NOTE — Progress Notes (Signed)
  Subjective:   Patient ID: Annette Silva, female    DOB: [birth year], 57 y.o.   MRN: [id number] CC: Follow-up (2 month)  HPI: Annette Silva is a 57 y.o. female presenting for Follow-up (2 month)  Walking several days a week, has been pleased with improved exercise tolerance  Still with breast "heaviness", feels like she is hung under her arm pits, lasts for a few seconds to minutes. Comes on less than once a week. Had b/l mammogram with ultrasound taht was normal since last visit. Nothing she can think of brings on the feeling, happens randomly, not associated with exercise  Has been pleased with weight loss from lifestyle changes  Continues to feel tired at times, no excessive bleeding with recent tooth extraction. Says she is still worried about easy bruising, No bruises today.  Relevant past medical, surgical, family and social history reviewed. Allergies and medications reviewed and updated. Social History   Tobacco Use  Smoking Status Never Smoker  Smokeless Tobacco Never Used   ROS: Per HPI   Objective:    BP 106/70   Pulse 60   Temp (!) 97.4 F (36.3 C) (Oral)   Ht 5' 5.75" (1.67 m)   Wt 147 lb (66.7 kg)   LMP 07/01/1996   BMI 23.91 kg/m   Wt Readings from Last 3 Encounters:  09/24/17 147 lb (66.7 kg)  07/21/17 156 lb (70.8 kg)  05/17/17 162 lb 6.4 oz (73.7 kg)    Gen: NAD, alert, cooperative with exam, NCAT EYES: EOMI, no conjunctival injection, or no icterus ENT:  TMs pearly gray b/l, OP without erythema LYMPH: no cervical LAD CV: NRRR, normal S1/S2, no murmur, distal pulses 2+ b/l Resp: CTABL, no wheezes, normal WOB Abd: +BS, soft, NTND. no guarding or organomegaly Ext: No edema, warm Neuro: Alert and oriented, strength equal b/l UE and LE, coordination grossly normal MSK: normal muscle bulk  Assessment & Plan:  Annette Silva was seen today for follow-up med problems.  Diagnoses and all orders for this visit:  Other fatigue -     CBC with  Differential/Platelet -     CMP14+EGFR  Need for hepatitis C screening test -     Hepatitis C antibody  Hypothyroidism, unspecified type -     levothyroxine (SYNTHROID, LEVOTHROID) 50 MCG tablet; Take 1 tablet (50 mcg total) by mouth daily before breakfast.   Follow up plan: Return in about 6 months (around 03/24/2018). Assunta Found, MD [place]

## 2017-09-25 LAB — CMP14+EGFR
A/G RATIO: 2 (ref 1.2–2.2)
ALT: 17 IU/L (ref 0–32)
AST: 22 IU/L (ref 0–40)
Albumin: 4.5 g/dL (ref 3.5–5.5)
Alkaline Phosphatase: 75 IU/L (ref 39–117)
BILIRUBIN TOTAL: 0.3 mg/dL (ref 0.0–1.2)
BUN/Creatinine Ratio: 13 (ref 9–23)
BUN: 10 mg/dL (ref 6–24)
CALCIUM: 9.4 mg/dL (ref 8.7–10.2)
CHLORIDE: 103 mmol/L (ref 96–106)
CO2: 27 mmol/L (ref 20–29)
Creatinine, Ser: 0.8 mg/dL (ref 0.57–1.00)
GFR calc Af Amer: 94 mL/min/{1.73_m2} (ref >59)
GFR, EST NON AFRICAN AMERICAN: 82 mL/min/{1.73_m2} (ref >59)
GLOBULIN, TOTAL: 2.3 g/dL (ref 1.5–4.5)
Glucose: 79 mg/dL (ref 65–99)
POTASSIUM: 4 mmol/L (ref 3.5–5.2)
SODIUM: 143 mmol/L (ref 134–144)
Total Protein: 6.8 g/dL (ref 6.0–8.5)

## 2017-09-25 LAB — CBC WITH DIFFERENTIAL/PLATELET
BASOS: 0 %
Basophils Absolute: 0 10*3/uL (ref 0.0–0.2)
EOS (ABSOLUTE): 0.1 10*3/uL (ref 0.0–0.4)
EOS: 1 %
HEMATOCRIT: 38 % (ref 34.0–46.6)
Hemoglobin: 13.2 g/dL (ref 11.1–15.9)
IMMATURE GRANULOCYTES: 0 %
Immature Grans (Abs): 0 10*3/uL (ref 0.0–0.1)
LYMPHS ABS: 1.5 10*3/uL (ref 0.7–3.1)
Lymphs: 34 %
MCH: 29.3 pg (ref 26.6–33.0)
MCHC: 34.7 g/dL (ref 31.5–35.7)
MCV: 84 fL (ref 79–97)
Monocytes Absolute: 0.3 10*3/uL (ref 0.1–0.9)
Monocytes: 8 %
NEUTROS ABS: 2.5 10*3/uL (ref 1.4–7.0)
Neutrophils: 57 %
PLATELETS: 226 10*3/uL (ref 150–379)
RBC: 4.51 x10E6/uL (ref 3.77–5.28)
RDW: 13.9 % (ref 12.3–15.4)
WBC: 4.5 10*3/uL (ref 3.4–10.8)

## 2017-09-25 LAB — HEPATITIS C ANTIBODY: Hep C Virus Ab: <0.1 s/co ratio (ref 0.0–0.9)

## 2018-04-06 ENCOUNTER — Ambulatory Visit (INDEPENDENT_AMBULATORY_CARE_PROVIDER_SITE_OTHER): Admitting: Pediatrics

## 2018-04-06 ENCOUNTER — Other Ambulatory Visit: Payer: Self-pay | Admitting: Pediatrics

## 2018-04-06 ENCOUNTER — Encounter: Payer: Self-pay | Admitting: Pediatrics

## 2018-04-06 ENCOUNTER — Ambulatory Visit (INDEPENDENT_AMBULATORY_CARE_PROVIDER_SITE_OTHER)

## 2018-04-06 VITALS — BP 108/72 | HR 58 | Temp 97.0°F | Wt 136.0 lb

## 2018-04-06 DIAGNOSIS — M79601 Pain in right arm: Secondary | ICD-10-CM

## 2018-04-06 NOTE — Progress Notes (Signed)
  Subjective:   Patient ID: Annette Silva, female    DOB: 1960-12-02, 57 y.o.   MRN: [id number] CC: right upper arm pain (Started a couple months ago)  HPI: Annette Silva is a 57 y.o. female   No known injury. Points to mid upper arm with where pain is. Doesn't hurt all the itme feels sore now, not all the time. Playing pickle ball for the last couple weeks, volleying forward she is fine, volleying backhand hurts her arm more. Weight of purse aggravates the pain. No pain in her shoulder, primarily proximal upper arm that bothers her.  They got an in-home gym, certain movements with her arm such as rotating arm from side to front against resistance bother her more.  Internally rotating her arm and reaching behind her back exacerbates the pain.  She has taken ibuprofen a few times.  She is not sure if it helps or not.  When the pain comes, if she stops that she is doing the pain usually resolves within 10 minutes or so.  Relevant past medical, surgical, family and social history reviewed. Allergies and medications reviewed and updated. Social History   Tobacco Use  Smoking Status Never Smoker  Smokeless Tobacco Never Used   ROS: Per HPI   Objective:    BP 108/72   Pulse (!) 58   Temp (!) 97 F (36.1 C) (Oral)   Wt 136 lb (61.7 kg)   LMP 07/01/1996   BMI 22.12 kg/m     Gen: NAD, alert, cooperative with exam, NCAT EYES: EOMI, no conjunctival injection, or no icterus CV: NRRR, normal S1/S2, no murmur, distal pulses 2+ b/l Resp: CTABL, no wheezes, normal WOB Abd: +BS, soft, NTND. no guarding or organomegaly Ext: No edema, warm Neuro: Alert and oriented, strength equal b/l UE and LE MSK: Rotator cuff muscles intact bilaterally.  Mildly tender to palpation posterior right shoulder.  No tenderness to palpation over deltoid, humerus.  Full range of motion bilaterally, more tentative with right shoulder base compared to left, she says because it feels tight.  Assessment & Plan:  Annette Silva was  seen today for right upper arm pain.  Diagnoses and all orders for this visit:  Right arm pain Avoid exacerbating activities.  Will refer to physical therapy.  Okay to take NSAIDs, use ice heat as needed. -     Ambulatory referral to Physical Therapy -     DG Shoulder Right; Future   Follow up plan: Return in about 8 weeks (around 06/01/2018), or if symptoms worsen or fail to improve. Assunta Found, MD [place]

## 2018-05-12 ENCOUNTER — Telehealth: Payer: Self-pay

## 2018-05-12 DIAGNOSIS — M25519 Pain in unspecified shoulder: Secondary | ICD-10-CM

## 2018-05-12 NOTE — Telephone Encounter (Signed)
Dr thinks she needs an MRI shoulder but she has [employer] for primary care has to order

## 2018-05-16 ENCOUNTER — Encounter: Payer: Self-pay | Admitting: Pediatrics

## 2018-05-16 NOTE — Addendum Note (Signed)
Addended by: Eustaquio Maize on: 05/16/2018 09:34 AM   Modules accepted: Orders

## 2018-05-16 NOTE — Telephone Encounter (Signed)
Put in referral to orthopedics

## 2018-05-30 ENCOUNTER — Ambulatory Visit: Attending: Orthopedic Surgery | Admitting: Physical Therapy

## 2018-05-30 ENCOUNTER — Other Ambulatory Visit: Payer: Self-pay

## 2018-05-30 ENCOUNTER — Encounter: Payer: Self-pay | Admitting: Physical Therapy

## 2018-05-30 DIAGNOSIS — M6281 Muscle weakness (generalized): Secondary | ICD-10-CM | POA: Diagnosis present

## 2018-05-30 DIAGNOSIS — M25511 Pain in right shoulder: Secondary | ICD-10-CM | POA: Diagnosis not present

## 2018-05-30 DIAGNOSIS — M25611 Stiffness of right shoulder, not elsewhere classified: Secondary | ICD-10-CM | POA: Insufficient documentation

## 2018-05-30 NOTE — Therapy (Signed)
[facility] [place], [place], [address] Phone: [phone]   Fax:  [phone]  Physical Therapy Evaluation  Patient Details  Name: Annette Silva MRN: [id number] Date of Birth: 1961/02/05 Referring Provider (PT): Justice Britain, MD   Encounter Date: 05/30/2018  PT End of Session - 05/30/18 1453    Visit Number  1    Number of Visits  12    Date for PT Re-Evaluation  07/04/18    Authorization Type  FOTO every 5th visit; Progress note every 10th visit    PT Start Time  1345    PT Stop Time  1433    PT Time Calculation (min)  48 min    Activity Tolerance  Patient limited by pain;Patient tolerated treatment well    Behavior During Therapy  [facility] for tasks assessed/performed       Past Medical History:  Diagnosis Date  . Allergy   . Anxiety   . Depression   . Mini stroke ([place]) 5/ 2015  . Salivary stones   . Thyroid disease     Past Surgical History:  Procedure Laterality Date  . ABDOMINAL HYSTERECTOMY  11 / 1997   partial   . [place]  . TOOTH EXTRACTION Left     There were no vitals filed for this visit.   Subjective Assessment - 05/30/18 1554    Subjective  Patient arrives to physical therapy with reports of right global shoulder and arm pain that began insidiously in July 2019. Patient was previously seeing a chiropractor for her right shoulder, but chiropractor referred her to Orthopedic doctor as there were limited improvements. Patient received a cortisone injection on 05/25/18 to which she reported has helped but has not fully alleviated pain.  Patient reports pain has caused discomfort with performing ADLs. Patient reports most difficulties with carrying, reaching, donning/doffing bra, and washing and drying hair. Patient reports pain at worst is 7/10; pain at best is 3/10. Patient's goals are to decrease pain, improve ability to perform work activities, sleep longer than 2.5 hours per night, improve  movement, improve strength, and return to PLOF.    Limitations  Lifting;House hold activities    Diagnostic tests  X-ray negative    Patient Stated Goals  use arm again without pain    Currently in Pain?  Yes    Pain Score  3     Pain Location  Shoulder    Pain Orientation  Right;Posterior    Pain Descriptors / Indicators  Sore    Pain Type  Chronic pain    Pain Onset  More than a month ago    Pain Frequency  Constant    Aggravating Factors   movement    Pain Relieving Factors  ice, advil    Effect of Pain on Daily Activities  increased time to perform ADLs    Multiple Pain Sites  No         [facility] PT Assessment - 05/30/18 0001      Assessment   Medical Diagnosis  Adhesive Capsulitis of right shoulder    Referring Provider (PT)  Justice Britain, MD    Onset Date/Surgical Date  --   July 2019   Hand Dominance  Right    Next MD Visit  06/22/18    Prior Therapy  No, previous chiropractic care but has stopped for shoulder      Precautions   Precautions  None      Restrictions  Weight Bearing Restrictions  No      Balance Screen   Has the patient fallen in the past 6 months  Yes    How many times?  1    Has the patient had a decrease in activity level because of a fear of falling?   No    Is the patient reluctant to leave their home because of a fear of falling?   No      Home Environment   Living Environment  Private residence    Living Arrangements  Spouse/significant other      Prior Function   Level of Independence  Independent with basic ADLs   Requires some assistance from husband   Vocation Requirements  computer job      Observation/Other Assessments   Observations  bilateral scapular winging      Posture/Postural Control   Posture/Postural Control  Postural limitations    Postural Limitations  Rounded Shoulders;Forward head;Increased thoracic kyphosis      ROM / Strength   AROM / PROM / Strength  AROM;PROM;Strength      AROM   AROM Assessment Site   Shoulder    Right/Left Shoulder  Right    Right Shoulder Flexion  92 Degrees    Right Shoulder ABduction  109 Degrees    Right Shoulder Internal Rotation  --   to abdomen; T12 functional   Right Shoulder External Rotation  46 Degrees   Functional ER T3     PROM   Overall PROM   Deficits    PROM Assessment Site  Shoulder    Right/Left Shoulder  Right    Right Shoulder Extension  105 Degrees   (+) pain   Right Shoulder ABduction  92 Degrees   (+) Pain   Right Shoulder Internal Rotation  62 Degrees    Right Shoulder External Rotation  68 Degrees      Strength   Strength Assessment Site  Shoulder    Right/Left Shoulder  Right    Right Shoulder Flexion  3-/5    Right Shoulder ABduction  3-/5    Right Shoulder Internal Rotation  3+/5    Right Shoulder External Rotation  3/5      Palpation   Palpation comment  Tender to palpation along R UT, medial border of the right scapulae, right deltoid insertion      Transfers   Transfers  Independent with all Transfers      Ambulation/Gait   Gait Pattern  Within Functional Limits                Objective measurements completed on examination: See above findings.              PT Education - 05/30/18 1453    Education Details  scapular retractions, chin tucks, AAROM table flexion, corner stretch    Person(s) Educated  Patient    Methods  Explanation;Handout;Demonstration    Comprehension  Verbalized understanding;Returned demonstration          PT Long Term Goals - 05/30/18 1454      PT LONG TERM GOAL #1   Title  Patient will be independent with HEP and it's progression    Time  4    Period  Weeks    Status  New      PT LONG TERM GOAL #2   Title  Patient will demonstrate 120+ degrees of right shoulder flexion AROM to improve ability to perform overhead activities.    Time  4    Period  Weeks    Status  New      PT LONG TERM GOAL #3   Title  Patient will demonstrate 70+ degrees of right shoulder ER  AROM to improve ability to don/doff apparel.    Time  4    Period  Weeks    Status  New      PT LONG TERM GOAL #4   Title  Patient will demonstrate functional IR to T10 or higher to improve ability to don/doff bra.    Time  4    Status  New      PT LONG TERM GOAL #5   Title  Patient will demonstrate 4/5 or greater right shoulder MMT in all planes to improve stability during functional tasks.     Time  4    Period  Weeks    Status  New      Additional Long Term Goals   Additional Long Term Goals  Yes      PT LONG TERM GOAL #6   Title  Patient will improve right grip strength to 45 lbs or greater or equal to left to improve ability to perform functional tasks.    Time  4    Period  Weeks    Status  New             Plan - 05/30/18 1556    Clinical Impression Statement  Patient is a 57 year old female who presents to physical therapy with right shoulder and arm pain, decreased AROM and PROM with pain at end range, and decreased right shoulder MMT. Patient reports tenderness to palpation to R UT, R deltoid insertion, and medial border of the scapula. Patient noted with bilateral scapular winging. Patient noted with forward head, rounded shoulders and increased thoracic kyphosis. Patient would benefit from skilled physical therapy to address deficits and address patient's goals.     Clinical Presentation  Stable    Clinical Decision Making  Low    Rehab Potential  Good    PT Frequency  3x / week    PT Duration  4 weeks    PT Treatment/Interventions  ADLs/Self Care Home Management;Iontophoresis 4mg /ml Dexamethasone;Moist Heat;Ultrasound;Cryotherapy;Electrical Stimulation;Therapeutic exercise;Therapeutic activities;Functional mobility training;Neuromuscular re-education;Manual techniques;Passive range of motion;Patient/family education;Vasopneumatic Device;Taping    PT Next Visit Plan  FOTO, Pulleys, UE ranger for AAROM, PROM, vaso and e-stim for pain relief    PT Home Exercise Plan   see patient education section    Consulted and Agree with Plan of Care  Patient       Patient will benefit from skilled therapeutic intervention in order to improve the following deficits and impairments:  Pain, Impaired UE functional use, Decreased activity tolerance, Decreased endurance, Decreased range of motion, Decreased strength, Postural dysfunction  Visit Diagnosis: Acute pain of right shoulder - Plan: PT plan of care cert/re-cert  Stiffness of right shoulder, not elsewhere classified - Plan: PT plan of care cert/re-cert  Muscle weakness (generalized) - Plan: PT plan of care cert/re-cert     Problem List Patient Active Problem List   Diagnosis Date Noted  . Presbycusis of both ears 10/20/2016  . Sialolithiasis of submandibular gland 10/20/2016  . Tinnitus aurium, 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  . Hypothyroidism 04/20/2016  . Overweight (BMI 25.0-29.9) 02/20/2016   Gabriela Eves, PT, DPT 05/30/2018, 4:02 PM  [facility] [address] [place], [place], [address] Phone: [phone]   Fax:  [phone]  Name: Annette Silva MRN:  [id number] Date of Birth: May 19, 1961

## 2018-06-01 ENCOUNTER — Ambulatory Visit: Attending: Orthopedic Surgery | Admitting: Physical Therapy

## 2018-06-01 ENCOUNTER — Encounter: Payer: Self-pay | Admitting: Physical Therapy

## 2018-06-01 DIAGNOSIS — M25611 Stiffness of right shoulder, not elsewhere classified: Secondary | ICD-10-CM | POA: Diagnosis present

## 2018-06-01 DIAGNOSIS — M6281 Muscle weakness (generalized): Secondary | ICD-10-CM | POA: Diagnosis present

## 2018-06-01 DIAGNOSIS — M25511 Pain in right shoulder: Secondary | ICD-10-CM | POA: Diagnosis present

## 2018-06-01 NOTE — Therapy (Signed)
[facility] [place], [place], [address] Phone: [phone]   Fax:  [phone]  Physical Therapy Treatment  Patient Details  Name: Annette Silva MRN: [id number] Date of Birth: Nov 22, 1960 Referring Provider (PT): Justice Britain, MD   Encounter Date: 06/01/2018  PT End of Session - 06/01/18 0809    Visit Number  2    Number of Visits  12    Date for PT Re-Evaluation  07/04/18    Authorization Type  Progress note every 10th visit    PT Start Time  0730    PT Stop Time  0819    PT Time Calculation (min)  49 min    Activity Tolerance  Patient limited by pain;Patient tolerated treatment well    Behavior During Therapy  [facility] for tasks assessed/performed       Past Medical History:  Diagnosis Date  . Allergy   . Anxiety   . Depression   . Mini stroke ([place]) 5/ 2015  . Salivary stones   . Thyroid disease     Past Surgical History:  Procedure Laterality Date  . ABDOMINAL HYSTERECTOMY  11 / 1997   partial   . [place]  . TOOTH EXTRACTION Left     There were no vitals filed for this visit.  Subjective Assessment - 06/01/18 0734    Subjective  Patient reported her right shoulder is feeling better. She reports no pain at rest.     Limitations  Lifting;House hold activities    Diagnostic tests  X-ray negative    Patient Stated Goals  use arm again without pain    Currently in Pain?  No/denies         [facility] PT Assessment - 06/01/18 0001      Assessment   Medical Diagnosis  Adhesive Capsulitis of right shoulder    Hand Dominance  Right    Next MD Visit  06/22/18    Prior Therapy  No, previous chiropractic care but has stopped for shoulder                   [facility] Adult PT Treatment/Exercise - 06/01/18 0001      Exercises   Exercises  Shoulder      Shoulder Exercises: Pulleys   Flexion  3 minutes      Shoulder Exercises: ROM/Strengthening   UBE (Upper Arm Bike)  120 RPM x6 minutes (3  fwd, 3 bwd)    Nustep  seated UE ranger, flexion CW and CCW 2 minutes each      Modalities   Modalities  Electrical Stimulation;Vasopneumatic      Electrical Stimulation   Electrical Stimulation Location  right shoulder    Electrical Stimulation Action  IFC    Electrical Stimulation Parameters  80-150 hz x15 min    Electrical Stimulation Goals  Pain      Vasopneumatic   Number Minutes Vasopneumatic   15 minutes    Vasopnuematic Location   Shoulder    Vasopneumatic Pressure  Low      Manual Therapy   Manual Therapy  Passive ROM    Passive ROM  Gentle PROM in flexion, ER, and ABD to improve ROM.                  PT Long Term Goals - 05/30/18 1454      PT LONG TERM GOAL #1   Title  Patient will be independent with HEP and it's progression  Time  4    Period  Weeks    Status  New      PT LONG TERM GOAL #2   Title  Patient will demonstrate 120+ degrees of right shoulder flexion AROM to improve ability to perform overhead activities.    Time  4    Period  Weeks    Status  New      PT LONG TERM GOAL #3   Title  Patient will demonstrate 70+ degrees of right shoulder ER AROM to improve ability to don/doff apparel.    Time  4    Period  Weeks    Status  New      PT LONG TERM GOAL #4   Title  Patient will demonstrate functional IR to T10 or higher to improve ability to don/doff bra.    Time  4    Status  New      PT LONG TERM GOAL #5   Title  Patient will demonstrate 4/5 or greater right shoulder MMT in all planes to improve stability during functional tasks.     Time  4    Period  Weeks    Status  New      Additional Long Term Goals   Additional Long Term Goals  Yes      PT LONG TERM GOAL #6   Title  Patient will improve right grip strength to 45 lbs or greater or equal to left to improve ability to perform functional tasks.    Time  4    Period  Weeks    Status  New            Plan - 06/01/18 7017    Clinical Impression Statement  Patient was  able to tolerate treatment well despite reports of increased soreness. Patient pointed to deltoid insertion and biceps and stated most of her pain occurs in that area. Patient educated that is a referral area for shoulder pain. Patient instructed to continue HEP as tolerated. Patient reported understanding. Normal response to modalities upon removal.     Clinical Presentation  Stable    Clinical Decision Making  Low    Rehab Potential  Good    PT Frequency  3x / week    PT Duration  4 weeks    PT Treatment/Interventions  ADLs/Self Care Home Management;Iontophoresis 4mg /ml Dexamethasone;Moist Heat;Ultrasound;Cryotherapy;Electrical Stimulation;Therapeutic exercise;Therapeutic activities;Functional mobility training;Neuromuscular re-education;Manual techniques;Passive range of motion;Patient/family education;Vasopneumatic Device;Taping    PT Next Visit Plan  Pulleys, UE ranger for AAROM, PROM, vaso and e-stim for pain relief    Consulted and Agree with Plan of Care  Patient       Patient will benefit from skilled therapeutic intervention in order to improve the following deficits and impairments:  Pain, Impaired UE functional use, Decreased activity tolerance, Decreased endurance, Decreased range of motion, Decreased strength, Postural dysfunction  Visit Diagnosis: Acute pain of right shoulder  Stiffness of right shoulder, not elsewhere classified  Muscle weakness (generalized)     Problem List Patient Active Problem List   Diagnosis Date Noted  . Presbycusis of both ears 10/20/2016  . Sialolithiasis of submandibular gland 10/20/2016  . Tinnitus aurium, 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  . Hypothyroidism 04/20/2016  . Overweight (BMI 25.0-29.9) 02/20/2016   Gabriela Eves, PT, DPT 06/01/2018, 12:21 PM  [facility] [address] [place], [place], [address] Phone: [phone]   Fax:  [phone]  Name: Annette Silva MRN: [id number] Date of Birth:  Oct 20, 1960

## 2018-06-03 ENCOUNTER — Ambulatory Visit: Admitting: Physical Therapy

## 2018-06-03 DIAGNOSIS — M25511 Pain in right shoulder: Secondary | ICD-10-CM

## 2018-06-03 DIAGNOSIS — M6281 Muscle weakness (generalized): Secondary | ICD-10-CM

## 2018-06-03 DIAGNOSIS — M25611 Stiffness of right shoulder, not elsewhere classified: Secondary | ICD-10-CM

## 2018-06-03 NOTE — Therapy (Signed)
[facility] [place], [place], [address] Phone: [phone]   Fax:  [phone]  Physical Therapy Treatment  Patient Details  Name: Annette Silva MRN: [id number] Date of Birth: [birth year] Referring Provider (PT): Justice Britain, MD   Encounter Date: 06/03/2018  PT End of Session - 06/03/18 0946    Visit Number  3    Number of Visits  12    Date for PT Re-Evaluation  07/04/18    Authorization Type  Progress note every 10th visit    PT Start Time  0900    PT Stop Time  0950    PT Time Calculation (min)  50 min    Activity Tolerance  Patient limited by pain;Patient tolerated treatment well    Behavior During Therapy  [facility] for tasks assessed/performed       Past Medical History:  Diagnosis Date  . Allergy   . Anxiety   . Depression   . Mini stroke ([place]) 5/ 2015  . Salivary stones   . Thyroid disease     Past Surgical History:  Procedure Laterality Date  . ABDOMINAL HYSTERECTOMY  11 / 1997   partial   . [place]  . TOOTH EXTRACTION Left     There were no vitals filed for this visit.  Subjective Assessment - 06/03/18 0938    Subjective  Pt relays her shoulder is doing better, not really pain just soreness. today    Currently in Pain?  Yes    Pain Score  1     Pain Location  Shoulder    Pain Orientation  Right    Pain Descriptors / Indicators  Sore    Pain Type  Chronic pain                       [facility] Adult PT Treatment/Exercise - 06/03/18 0001      Exercises   Exercises  Shoulder      Shoulder Exercises: Supine   Flexion  AAROM;15 reps   cane     Shoulder Exercises: Standing   Other Standing Exercises  UE ranger flexion, abd, cirlces X 20 ea      Shoulder Exercises: Pulleys   Flexion  3 minutes      Shoulder Exercises: ROM/Strengthening   UBE (Upper Arm Bike)  120 RPM x6 minutes (3 fwd, 3 bwd)      Shoulder Exercises: Stretch   Internal Rotation Stretch  10 seconds    10 reps with belt     Modalities   Modalities  Electrical Stimulation;Vasopneumatic      Electrical Stimulation   Electrical Stimulation Location  right shoulder    Electrical Stimulation Action  IFC    Electrical Stimulation Parameters  80-150 hz X 15    Electrical Stimulation Goals  Pain      Vasopneumatic   Number Minutes Vasopneumatic   15 minutes    Vasopnuematic Location   Shoulder    Vasopneumatic Pressure  Low      Manual Therapy   Manual Therapy  Passive ROM    Passive ROM  Gentle PROM in flexion, ER, and ABD to improve ROM.              PT Education - 06/03/18 0945    Education Details  added IR stretch and supine cane flexion into HEP    Person(s) Educated  Patient    Methods  Explanation;Demonstration;Verbal cues    Comprehension  Verbalized understanding;Returned demonstration          PT Long Term Goals - 06/03/18 0947      PT LONG TERM GOAL #1   Title  Patient will be independent with HEP and it's progression    Time  4    Period  Weeks    Status  On-going      PT LONG TERM GOAL #2   Title  Patient will demonstrate 120+ degrees of right shoulder flexion AROM to improve ability to perform overhead activities.    Time  4    Period  Weeks    Status  Partially Met      PT LONG TERM GOAL #3   Title  Patient will demonstrate 70+ degrees of right shoulder ER AROM to improve ability to don/doff apparel.    Time  4    Period  Weeks    Status  Partially Met      PT LONG TERM GOAL #4   Title  Patient will demonstrate functional IR to T10 or higher to improve ability to don/doff bra.    Time  4    Period  Weeks    Status  On-going      PT LONG TERM GOAL #5   Title  Patient will demonstrate 4/5 or greater right shoulder MMT in all planes to improve stability during functional tasks.     Time  4    Period  Weeks    Status  On-going      PT LONG TERM GOAL #6   Title  Patient will improve right grip strength to 45 lbs or greater or equal to  left to improve ability to perform functional tasks.    Time  4    Period  Weeks    Status  On-going            Plan - 06/03/18 0946    Clinical Impression Statement  Pt is making good progress with PT thus far. Session focused on shoulder ROM with good tolerance. She was given IR stretch to add in at home as she is still limited with this.     Rehab Potential  Good    PT Frequency  3x / week    PT Duration  4 weeks    PT Treatment/Interventions  ADLs/Self Care Home Management;Iontophoresis 1m/ml Dexamethasone;Moist Heat;Ultrasound;Cryotherapy;Electrical Stimulation;Therapeutic exercise;Therapeutic activities;Functional mobility training;Neuromuscular re-education;Manual techniques;Passive range of motion;Patient/family education;Vasopneumatic Device;Taping    PT Next Visit Plan  Pulleys, UE ranger for AAROM, PROM, vaso and e-stim for pain relief    Consulted and Agree with Plan of Care  Patient       Patient will benefit from skilled therapeutic intervention in order to improve the following deficits and impairments:  Pain, Impaired UE functional use, Decreased activity tolerance, Decreased endurance, Decreased range of motion, Decreased strength, Postural dysfunction  Visit Diagnosis: Acute pain of right shoulder  Stiffness of right shoulder, not elsewhere classified  Muscle weakness (generalized)     Problem List Patient Active Problem List   Diagnosis Date Noted  . Presbycusis of both ears 10/20/2016  . Sialolithiasis of submandibular gland 10/20/2016  . Tinnitus aurium, 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  . Hypothyroidism 04/20/2016  . Overweight (BMI 25.0-29.9) 02/20/2016    BDebbe Odea PT, DPT 06/03/2018, 9:49 AM  [facility] [address]Phone: [phone]  Fax:  [phone] Name: LMaymie BrunkeMRN: [id number] of Birth: [birth year]

## 2018-06-06 ENCOUNTER — Ambulatory Visit: Admitting: Physical Therapy

## 2018-06-06 ENCOUNTER — Encounter: Payer: Self-pay | Admitting: Physical Therapy

## 2018-06-06 DIAGNOSIS — M25611 Stiffness of right shoulder, not elsewhere classified: Secondary | ICD-10-CM

## 2018-06-06 DIAGNOSIS — M6281 Muscle weakness (generalized): Secondary | ICD-10-CM

## 2018-06-06 DIAGNOSIS — M25511 Pain in right shoulder: Secondary | ICD-10-CM | POA: Diagnosis not present

## 2018-06-06 NOTE — Therapy (Signed)
[facility] [place], [place], [address] Phone: [phone]   Fax:  [phone]  Physical Therapy Treatment  Patient Details  Name: Annette Silva MRN: [id number] Date of Birth: [birth year] Referring Provider (PT): Justice Britain, MD   Encounter Date: 06/06/2018  PT End of Session - 06/06/18 1443    Visit Number  4    Number of Visits  12    Date for PT Re-Evaluation  07/04/18    Authorization Type  Progress note every 10th visit    PT Start Time  1430    PT Stop Time  1521    PT Time Calculation (min)  51 min    Activity Tolerance  Patient limited by pain;Patient tolerated treatment well    Behavior During Therapy  [facility] for tasks assessed/performed       Past Medical History:  Diagnosis Date  . Allergy   . Anxiety   . Depression   . Mini stroke ([place]) 5/ 2015  . Salivary stones   . Thyroid disease     Past Surgical History:  Procedure Laterality Date  . ABDOMINAL HYSTERECTOMY  11 / 1997   partial   . [place]  . TOOTH EXTRACTION Left     There were no vitals filed for this visit.  Subjective Assessment - 06/06/18 1434    Subjective  Patient reports shoulder is feeling good, no pain just soreness. Reports she has been complaint with new exercises provided last visit as well.     Limitations  Lifting;House hold activities    Diagnostic tests  X-ray negative    Patient Stated Goals  use arm again without pain    Currently in Pain?  Yes    Pain Score  1     Pain Location  Shoulder    Pain Orientation  Right    Pain Descriptors / Indicators  Sore    Pain Type  Chronic pain    Pain Onset  More than a month ago    Pain Frequency  Constant         [facility] PT Assessment - 06/06/18 0001      Assessment   Medical Diagnosis  Adhesive Capsulitis of right shoulder    Hand Dominance  Right    Next MD Visit  06/22/18    Prior Therapy  No, previous chiropractic care but has stopped for shoulder                    [facility] Adult PT Treatment/Exercise - 06/06/18 0001      Exercises   Exercises  Shoulder      Shoulder Exercises: Supine   External Rotation  AAROM;Right;15 reps   cane   Flexion  AAROM;20 reps   with cane     Shoulder Exercises: Standing   Other Standing Exercises  UE ranger flexion, abd, cirlces X 20 ea      Shoulder Exercises: Pulleys   Flexion  5 minutes      Shoulder Exercises: ROM/Strengthening   UBE (Upper Arm Bike)  120 RPM x6 minutes (3 fwd, 3 bwd)      Modalities   Modalities  Electrical Stimulation;Vasopneumatic      Electrical Stimulation   Electrical Stimulation Location  right shoulder    Electrical Stimulation Action  IFC    Electrical Stimulation Parameters  80-150 hz x15 min    Electrical Stimulation Goals  Pain      Vasopneumatic   Number  Minutes Vasopneumatic   15 minutes    Vasopnuematic Location   Shoulder    Vasopneumatic Pressure  Low      Manual Therapy   Manual Therapy  Passive ROM    Passive ROM  Gentle PROM in flexion, ER, IR to improve ROM.                   PT Long Term Goals - 06/03/18 0947      PT LONG TERM GOAL #1   Title  Patient will be independent with HEP and it's progression    Time  4    Period  Weeks    Status  On-going      PT LONG TERM GOAL #2   Title  Patient will demonstrate 120+ degrees of right shoulder flexion AROM to improve ability to perform overhead activities.    Time  4    Period  Weeks    Status  Partially Met      PT LONG TERM GOAL #3   Title  Patient will demonstrate 70+ degrees of right shoulder ER AROM to improve ability to don/doff apparel.    Time  4    Period  Weeks    Status  Partially Met      PT LONG TERM GOAL #4   Title  Patient will demonstrate functional IR to T10 or higher to improve ability to don/doff bra.    Time  4    Period  Weeks    Status  On-going      PT LONG TERM GOAL #5   Title  Patient will demonstrate 4/5 or greater right shoulder MMT  in all planes to improve stability during functional tasks.     Time  4    Period  Weeks    Status  On-going      PT LONG TERM GOAL #6   Title  Patient will improve right grip strength to 45 lbs or greater or equal to left to improve ability to perform functional tasks.    Time  4    Period  Weeks    Status  On-going            Plan - 06/06/18 1444    Clinical Impression Statement  Patient was able to tolerate treatment well with minimal reports of pain. Patient still noted with increase of pain at end ranges of PROM but noted with smooth motion in all planes. Normal response to modalities upon removal.     Clinical Presentation  Stable    Clinical Decision Making  Low    Rehab Potential  Good    PT Frequency  3x / week    PT Duration  4 weeks    PT Treatment/Interventions  ADLs/Self Care Home Management;Iontophoresis 58m/ml Dexamethasone;Moist Heat;Ultrasound;Cryotherapy;Electrical Stimulation;Therapeutic exercise;Therapeutic activities;Functional mobility training;Neuromuscular re-education;Manual techniques;Passive range of motion;Patient/family education;Vasopneumatic Device;Taping    PT Next Visit Plan  cont with POC, Pulleys, UE ranger for AAROM, PROM, vaso and e-stim for pain relief    Consulted and Agree with Plan of Care  Patient       Patient will benefit from skilled therapeutic intervention in order to improve the following deficits and impairments:  Pain, Impaired UE functional use, Decreased activity tolerance, Decreased endurance, Decreased range of motion, Decreased strength, Postural dysfunction  Visit Diagnosis: Acute pain of right shoulder  Stiffness of right shoulder, not elsewhere classified  Muscle weakness (generalized)     Problem List Patient Active Problem List  Diagnosis Date Noted  . Presbycusis of both ears 10/20/2016  . Sialolithiasis of submandibular gland 10/20/2016  . Tinnitus aurium, 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  . Hypothyroidism 04/20/2016   . Overweight (BMI 25.0-29.9) 02/20/2016   Gabriela Eves, PT, DPT 06/06/2018, 3:22 PM  [facility] [address] [place], [place], [address] Phone: [phone]   Fax:  [phone]  Name: Annette Silva MRN: [id number] Date of Birth: 04-25-1961

## 2018-06-08 ENCOUNTER — Ambulatory Visit: Admitting: Physical Therapy

## 2018-06-08 ENCOUNTER — Encounter: Payer: Self-pay | Admitting: Physical Therapy

## 2018-06-08 DIAGNOSIS — M25511 Pain in right shoulder: Secondary | ICD-10-CM

## 2018-06-08 DIAGNOSIS — M25611 Stiffness of right shoulder, not elsewhere classified: Secondary | ICD-10-CM

## 2018-06-08 DIAGNOSIS — M6281 Muscle weakness (generalized): Secondary | ICD-10-CM

## 2018-06-08 NOTE — Therapy (Signed)
[facility] [place], [place], [address] Phone: [phone]   Fax:  [phone]  Physical Therapy Treatment  Patient Details  Name: Annette Silva MRN: [id number] Date of Birth: [birth year] Referring Provider (PT): Justice Britain, MD   Encounter Date: 06/08/2018  PT End of Session - 06/08/18 1435    Visit Number  5    Number of Visits  12    Date for PT Re-Evaluation  07/04/18    Authorization Type  Progress note every 10th visit    PT Start Time  1435    PT Stop Time  1517    PT Time Calculation (min)  42 min    Activity Tolerance  Patient tolerated treatment well    Behavior During Therapy  [facility] for tasks assessed/performed       Past Medical History:  Diagnosis Date  . Allergy   . Anxiety   . Depression   . Mini stroke ([place]) 5/ 2015  . Salivary stones   . Thyroid disease     Past Surgical History:  Procedure Laterality Date  . ABDOMINAL HYSTERECTOMY  11 / 1997   partial   . [place]  . TOOTH EXTRACTION Left     There were no vitals filed for this visit.  Subjective Assessment - 06/08/18 1448    Subjective  Patient arrives with no pain just soreness, 1/10    Limitations  Lifting;House hold activities    Diagnostic tests  X-ray negative    Patient Stated Goals  use arm again without pain    Currently in Pain?  Yes    Pain Score  1     Pain Location  Shoulder    Pain Orientation  Right    Pain Descriptors / Indicators  Sore    Pain Type  Chronic pain    Pain Onset  More than a month ago    Pain Frequency  Constant         [facility] PT Assessment - 06/08/18 0001      Assessment   Medical Diagnosis  Adhesive Capsulitis of right shoulder    Hand Dominance  Right    Next MD Visit  06/22/18    Prior Therapy  No, previous chiropractic care but has stopped for shoulder                   [facility] Adult PT Treatment/Exercise - 06/08/18 0001      Exercises   Exercises  Shoulder       Shoulder Exercises: Standing   Extension  AAROM;Both;20 reps    Extension Limitations  cane    Other Standing Exercises  behind the back horizontal adduction x10 followed by extension x10 followed by IR x10      Shoulder Exercises: Pulleys   Flexion  5 minutes      Shoulder Exercises: ROM/Strengthening   UBE (Upper Arm Bike)  120 RPM x6 minutes (3 fwd, 3 bwd)    Other ROM/Strengthening Exercises  wall ladder x10       Modalities   Modalities  Electrical Stimulation;Vasopneumatic      Electrical Stimulation   Electrical Stimulation Location  right shoulder    Electrical Stimulation Action  IFC    Electrical Stimulation Parameters  80-150 hz x10 min    Electrical Stimulation Goals  Pain      Vasopneumatic   Number Minutes Vasopneumatic   10 minutes    Vasopnuematic Location   Shoulder  Vasopneumatic Pressure  Low      Manual Therapy   Manual Therapy  Passive ROM    Passive ROM  Gentle PROM in flexion, ER, IR and hori abd to improve ROM. rhythmic stabilization at 90 degrees 30" x3                  PT Long Term Goals - 06/03/18 0947      PT LONG TERM GOAL #1   Title  Patient will be independent with HEP and it's progression    Time  4    Period  Weeks    Status  On-going      PT LONG TERM GOAL #2   Title  Patient will demonstrate 120+ degrees of right shoulder flexion AROM to improve ability to perform overhead activities.    Time  4    Period  Weeks    Status  Partially Met      PT LONG TERM GOAL #3   Title  Patient will demonstrate 70+ degrees of right shoulder ER AROM to improve ability to don/doff apparel.    Time  4    Period  Weeks    Status  Partially Met      PT LONG TERM GOAL #4   Title  Patient will demonstrate functional IR to T10 or higher to improve ability to don/doff bra.    Time  4    Period  Weeks    Status  On-going      PT LONG TERM GOAL #5   Title  Patient will demonstrate 4/5 or greater right shoulder MMT in all planes to  improve stability during functional tasks.     Time  4    Period  Weeks    Status  On-going      PT LONG TERM GOAL #6   Title  Patient will improve right grip strength to 45 lbs or greater or equal to left to improve ability to perform functional tasks.    Time  4    Period  Weeks    Status  On-going            Plan - 06/08/18 1507    Clinical Impression Statement  Patient was able to tolerate treatment well and was able to perform new exercises with good form and technique. Patient continues to have a firm end feel at end range with increased soreness but not pain. Patient instructed to continue to ice perform HEP and add behind the back adduction and extension to improve functional IR. Patient reported understanding. Normal response to modalities upon removal.     Clinical Presentation  Stable    Clinical Decision Making  Low    Rehab Potential  Good    PT Frequency  3x / week    PT Duration  4 weeks    PT Treatment/Interventions  ADLs/Self Care Home Management;Iontophoresis 2m/ml Dexamethasone;Moist Heat;Ultrasound;Cryotherapy;Electrical Stimulation;Therapeutic exercise;Therapeutic activities;Functional mobility training;Neuromuscular re-education;Manual techniques;Passive range of motion;Patient/family education;Vasopneumatic Device;Taping    PT Next Visit Plan  cont with POC, Pulleys, UE ranger for AAROM, PROM, vaso and e-stim for pain relief    PT Home Exercise Plan  behind back horizontal adduction, shoulder extension AAROM    Consulted and Agree with Plan of Care  Patient       Patient will benefit from skilled therapeutic intervention in order to improve the following deficits and impairments:  Pain, Impaired UE functional use, Decreased activity tolerance, Decreased endurance, Decreased range of motion, Decreased  strength, Postural dysfunction  Visit Diagnosis: Acute pain of right shoulder  Stiffness of right shoulder, not elsewhere classified  Muscle weakness  (generalized)     Problem List Patient Active Problem List   Diagnosis Date Noted  . Presbycusis of both ears 10/20/2016  . Sialolithiasis of submandibular gland 10/20/2016  . Tinnitus aurium, 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  . Hypothyroidism 04/20/2016  . Overweight (BMI 25.0-29.9) 02/20/2016    Gabriela Eves, PT, DPT 06/08/2018, 3:17 PM  [facility] [address] [place], [place], [address] Phone: [phone]   Fax:  [phone]  Name: Elner Seifert MRN: [id number] Date of Birth: 1960/11/05

## 2018-06-10 ENCOUNTER — Ambulatory Visit: Admitting: Physical Therapy

## 2018-06-10 ENCOUNTER — Encounter: Payer: Self-pay | Admitting: Physical Therapy

## 2018-06-10 DIAGNOSIS — M25511 Pain in right shoulder: Secondary | ICD-10-CM

## 2018-06-10 DIAGNOSIS — M25611 Stiffness of right shoulder, not elsewhere classified: Secondary | ICD-10-CM

## 2018-06-10 DIAGNOSIS — M6281 Muscle weakness (generalized): Secondary | ICD-10-CM

## 2018-06-10 NOTE — Therapy (Signed)
[facility] [place], [place], [address] Phone: [phone]   Fax:  [phone]  Physical Therapy Treatment  Patient Details  Name: Annette Silva MRN: [id number] Date of Birth: [birth year] Referring Provider (PT): Justice Britain, MD   Encounter Date: 06/10/2018  PT End of Session - 06/10/18 0742    Visit Number  6    Number of Visits  12    Date for PT Re-Evaluation  07/04/18    Authorization Type  Progress note every 10th visit    PT Start Time  0730    PT Stop Time  0821    PT Time Calculation (min)  51 min    Activity Tolerance  Patient tolerated treatment well    Behavior During Therapy  [facility] for tasks assessed/performed       Past Medical History:  Diagnosis Date  . Allergy   . Anxiety   . Depression   . Mini stroke ([place]) 5/ 2015  . Salivary stones   . Thyroid disease     Past Surgical History:  Procedure Laterality Date  . ABDOMINAL HYSTERECTOMY  11 / 1997   partial   . [place]  . TOOTH EXTRACTION Left     There were no vitals filed for this visit.  Subjective Assessment - 06/10/18 0741    Subjective  Patient reported feeling a little more sore today, 2/10 and is unsure if she slept wrong causing increased sorenesss.     Limitations  Lifting;House hold activities    Diagnostic tests  X-ray negative    Patient Stated Goals  use arm again without pain    Currently in Pain?  Yes    Pain Score  2     Pain Location  Shoulder    Pain Orientation  Right    Pain Descriptors / Indicators  Sore    Pain Type  Chronic pain    Pain Onset  More than a month ago    Pain Frequency  Constant         [facility] PT Assessment - 06/10/18 0001      Assessment   Medical Diagnosis  Adhesive Capsulitis of right shoulder    Hand Dominance  Right    Next MD Visit  06/22/18    Prior Therapy  No, previous chiropractic care but has stopped for shoulder      AROM   Right Shoulder Flexion  128 Degrees    Right Shoulder ABduction  116 Degrees    Right Shoulder External Rotation  62 Degrees      Strength   Strength Assessment Site  Hand    Right Shoulder Flexion  3+/5    Right Shoulder ABduction  3+/5    Right Shoulder Internal Rotation  3+/5    Right Shoulder External Rotation  3+/5    Right/Left hand  Right;Left    Right Hand Grip (lbs)  20    Left Hand Grip (lbs)  40                   [facility] Adult PT Treatment/Exercise - 06/10/18 0001      Exercises   Exercises  Shoulder      Shoulder Exercises: Standing   Internal Rotation  AAROM;Right;10 reps    Internal Rotation Limitations  cane; behind the back    Extension  AAROM;Both;20 reps    Extension Limitations  cane    Other Standing Exercises  behind the back horizontal adduction  x10 followed by extension x10 followed by IR x10      Shoulder Exercises: Pulleys   Flexion  5 minutes      Shoulder Exercises: ROM/Strengthening   UBE (Upper Arm Bike)  120 RPM x6 minutes (3 fwd, 3 bwd)      Shoulder Exercises: Stretch   Other Shoulder Stretches  supine horizontal abduction stretch 5" hold x10      Modalities   Modalities  Electrical Stimulation;Vasopneumatic      Electrical Stimulation   Electrical Stimulation Location  right shoulder    Electrical Stimulation Action  IFC    Electrical Stimulation Parameters  80-150 hz x15 min    Electrical Stimulation Goals  Pain      Vasopneumatic   Number Minutes Vasopneumatic   15 minutes    Vasopnuematic Location   Shoulder    Vasopneumatic Pressure  Low      Manual Therapy   Manual Therapy  Passive ROM    Passive ROM  Gentle PROM in flexion, ER, IR and hori abd to improve ROM. rhythmic stabilization at 90 degrees 30" x3                  PT Long Term Goals - 06/10/18 0743      PT LONG TERM GOAL #1   Title  Patient will be independent with HEP and it's progression    Time  4    Period  Weeks    Status  On-going      PT LONG TERM GOAL #2   Title   Patient will demonstrate 120+ degrees of right shoulder flexion AROM to improve ability to perform overhead activities.    Time  4    Period  Weeks    Status  Achieved      PT LONG TERM GOAL #3   Title  Patient will demonstrate 70+ degrees of right shoulder ER AROM to improve ability to don/doff apparel.    Time  4    Period  Weeks    Status  On-going      PT LONG TERM GOAL #4   Title  Patient will demonstrate functional IR to T10 or higher to improve ability to don/doff bra.    Time  4    Period  Weeks    Status  On-going      PT LONG TERM GOAL #5   Title  Patient will demonstrate 4/5 or greater right shoulder MMT in all planes to improve stability during functional tasks.     Time  4    Period  Weeks    Status  On-going      PT LONG TERM GOAL #6   Title  Patient will improve right grip strength to 45 lbs or greater or equal to left to improve ability to perform functional tasks.    Time  4    Period  Weeks            Plan - 06/10/18 0810    Clinical Impression Statement  Patient was able to tolerate treatment well despite increased soreness. Patient noted with improved AROM ince initial evaluation. Patient provided with putty to improve grip strength. Patient reported understanding. Normal response to modalities upon removal.    Clinical Presentation  Stable    Clinical Decision Making  Low    Rehab Potential  Good    PT Frequency  3x / week    PT Duration  4 weeks    PT Treatment/Interventions  ADLs/Self Care Home Management;Iontophoresis 4mg /ml Dexamethasone;Moist Heat;Ultrasound;Cryotherapy;Electrical Stimulation;Therapeutic exercise;Therapeutic activities;Functional mobility training;Neuromuscular re-education;Manual techniques;Passive range of motion;Patient/family education;Vasopneumatic Device;Taping    PT Next Visit Plan  begin strengthening exercises Pulleys, UE ranger for AAROM, PROM, vaso and e-stim for pain relief    PT Home Exercise Plan  putty to improve  grip strength    Consulted and Agree with Plan of Care  Patient       Patient will benefit from skilled therapeutic intervention in order to improve the following deficits and impairments:  Pain, Impaired UE functional use, Decreased activity tolerance, Decreased endurance, Decreased range of motion, Decreased strength, Postural dysfunction  Visit Diagnosis: Acute pain of right shoulder  Stiffness of right shoulder, not elsewhere classified  Muscle weakness (generalized)     Problem List Patient Active Problem List   Diagnosis Date Noted  . Presbycusis of both ears 10/20/2016  . Sialolithiasis of submandibular gland 10/20/2016  . Tinnitus aurium, 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  . Hypothyroidism 04/20/2016  . Overweight (BMI 25.0-29.9) 02/20/2016   Gabriela Eves, PT, DPT 06/10/2018, 9:18 AM  [facility] [address] [place], [place], [address] Phone: [phone]   Fax:  [phone]  Name: Kevin Mario MRN: [id number] Date of Birth: 30-Jan-1961

## 2018-06-13 ENCOUNTER — Ambulatory Visit: Admitting: Physical Therapy

## 2018-06-13 ENCOUNTER — Encounter: Payer: Self-pay | Admitting: Physical Therapy

## 2018-06-13 DIAGNOSIS — M25511 Pain in right shoulder: Secondary | ICD-10-CM

## 2018-06-13 DIAGNOSIS — M25611 Stiffness of right shoulder, not elsewhere classified: Secondary | ICD-10-CM

## 2018-06-13 DIAGNOSIS — M6281 Muscle weakness (generalized): Secondary | ICD-10-CM

## 2018-06-13 NOTE — Therapy (Signed)
[facility] [place], [place], [address] Phone: [phone]   Fax:  [phone]  Physical Therapy Treatment  Patient Details  Name: Annette Silva MRN: [id number] Date of Birth: 08-26-1961 Referring Provider (PT): Justice Britain, MD   Encounter Date: 06/13/2018  PT End of Session - 06/13/18 1429    Visit Number  7    Number of Visits  12    Date for PT Re-Evaluation  07/04/18    Authorization Type  Progress note every 10th visit    PT Start Time  1427    PT Stop Time  1520    PT Time Calculation (min)  53 min    Activity Tolerance  Patient tolerated treatment well    Behavior During Therapy  [facility] for tasks assessed/performed       Past Medical History:  Diagnosis Date  . Allergy   . Anxiety   . Depression   . Mini stroke ([place]) 5/ 2015  . Salivary stones   . Thyroid disease     Past Surgical History:  Procedure Laterality Date  . ABDOMINAL HYSTERECTOMY  11 / 1997   partial   . [place]  . TOOTH EXTRACTION Left     There were no vitals filed for this visit.  Subjective Assessment - 06/13/18 1430    Subjective  Patient reported having a long weekend and stated she's a 2/10.    Limitations  Lifting;House hold activities    Diagnostic tests  X-ray negative    Patient Stated Goals  use arm again without pain    Currently in Pain?  Yes    Pain Score  2     Pain Location  Shoulder    Pain Orientation  Right    Pain Descriptors / Indicators  Sore    Pain Type  Chronic pain    Pain Onset  More than a month ago    Pain Frequency  Constant         [facility] PT Assessment - 06/13/18 0001      Assessment   Medical Diagnosis  Adhesive Capsulitis of right shoulder    Hand Dominance  Right    Next MD Visit  06/22/18    Prior Therapy  No, previous chiropractic care but has stopped for shoulder                   [facility] Adult PT Treatment/Exercise - 06/13/18 0001      Exercises   Exercises   Shoulder      Shoulder Exercises: Standing   Protraction  Strengthening;Right;10 reps;Theraband    Theraband Level (Shoulder Protraction)  Level 1 (Yellow)    External Rotation  10 reps;Right;Theraband;Strengthening    Theraband Level (Shoulder External Rotation)  Level 1 (Yellow)    Internal Rotation  Strengthening;Right;10 reps;Theraband    Row  Strengthening;Right;10 reps;Theraband    Theraband Level (Shoulder Row)  Level 1 (Yellow)      Shoulder Exercises: Pulleys   Flexion  3 minutes      Shoulder Exercises: ROM/Strengthening   UBE (Upper Arm Bike)  120 RPM x8 minutes (4 fwd, 4 bwd)      Modalities   Modalities  Electrical Stimulation;Vasopneumatic      Electrical Stimulation   Electrical Stimulation Location  right shoulder    Electrical Stimulation Action  IFC    Electrical Stimulation Parameters  80-150 hz x15 min    Electrical Stimulation Goals  Pain  Vasopneumatic   Number Minutes Vasopneumatic   15 minutes    Vasopnuematic Location   Shoulder    Vasopneumatic Pressure  Low      Manual Therapy   Manual Therapy  Passive ROM    Passive ROM  Gentle PROM in flexion, ER, IR and hori abd to improve ROM. rhythmic stabilization at 90 degrees 30" x3                  PT Long Term Goals - 06/10/18 0743      PT LONG TERM GOAL #1   Title  Patient will be independent with HEP and it's progression    Time  4    Period  Weeks    Status  On-going      PT LONG TERM GOAL #2   Title  Patient will demonstrate 120+ degrees of right shoulder flexion AROM to improve ability to perform overhead activities.    Time  4    Period  Weeks    Status  Achieved      PT LONG TERM GOAL #3   Title  Patient will demonstrate 70+ degrees of right shoulder ER AROM to improve ability to don/doff apparel.    Time  4    Period  Weeks    Status  On-going      PT LONG TERM GOAL #4   Title  Patient will demonstrate functional IR to T10 or higher to improve ability to don/doff  bra.    Time  4    Period  Weeks    Status  On-going      PT LONG TERM GOAL #5   Title  Patient will demonstrate 4/5 or greater right shoulder MMT in all planes to improve stability during functional tasks.     Time  4    Period  Weeks    Status  On-going      PT LONG TERM GOAL #6   Title  Patient will improve right grip strength to 45 lbs or greater or equal to left to improve ability to perform functional tasks.    Time  4    Period  Weeks            Plan - 06/13/18 1513    Clinical Impression Statement  Patient was able to tolerate progression of treatment well with no reports of increased pain or soreness. Patient was able to demonstrate good form with all exercises after demonstration and did not require cuing for the remaining of the exercise. Patient educated she may be more sore since we progressed into some strenthening exercises but to ice as needed. Patient reported understanding. Normal response to modalities upon removal.     Clinical Presentation  Stable    Clinical Decision Making  Low    Rehab Potential  Good    PT Frequency  3x / week    PT Duration  4 weeks    PT Treatment/Interventions  ADLs/Self Care Home Management;Iontophoresis 4mg /ml Dexamethasone;Moist Heat;Ultrasound;Cryotherapy;Electrical Stimulation;Therapeutic exercise;Therapeutic activities;Functional mobility training;Neuromuscular re-education;Manual techniques;Passive range of motion;Patient/family education;Vasopneumatic Device;Taping    PT Next Visit Plan  assess respone after strengthening exercises cont. strengthening exercises to tolerance. UE ranger for AAROM, PROM, vaso and e-stim for pain relief    Consulted and Agree with Plan of Care  Patient       Patient will benefit from skilled therapeutic intervention in order to improve the following deficits and impairments:  Pain, Impaired UE functional use, Decreased activity tolerance,  Decreased endurance, Decreased range of motion, Decreased  strength, Postural dysfunction  Visit Diagnosis: Stiffness of right shoulder, not elsewhere classified  Acute pain of right shoulder  Muscle weakness (generalized)     Problem List Patient Active Problem List   Diagnosis Date Noted  . Presbycusis of both ears 10/20/2016  . Sialolithiasis of submandibular gland 10/20/2016  . Tinnitus aurium, 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  . Hypothyroidism 04/20/2016  . Overweight (BMI 25.0-29.9) 02/20/2016   Gabriela Eves, PT, DPT 06/13/2018, 3:28 PM  [facility] [address] [place], [place], [address] Phone: [phone]   Fax:  [phone]  Name: Annette Silva MRN: [id number] Date of Birth: April 26, 1961

## 2018-06-15 ENCOUNTER — Ambulatory Visit: Admitting: Physical Therapy

## 2018-06-15 ENCOUNTER — Encounter: Payer: Self-pay | Admitting: Physical Therapy

## 2018-06-15 DIAGNOSIS — M6281 Muscle weakness (generalized): Secondary | ICD-10-CM

## 2018-06-15 DIAGNOSIS — M25611 Stiffness of right shoulder, not elsewhere classified: Secondary | ICD-10-CM

## 2018-06-15 DIAGNOSIS — M25511 Pain in right shoulder: Secondary | ICD-10-CM

## 2018-06-15 NOTE — Therapy (Signed)
[facility] [place], [place], [address] Phone: [phone]   Fax:  [phone]  Physical Therapy Treatment  Patient Details  Name: Annette Silva MRN: [id number] Date of Birth: 1961-05-09 Referring Provider (PT): Justice Britain, MD   Encounter Date: 06/15/2018  PT End of Session - 06/15/18 1515    Visit Number  8    Number of Visits  12    Date for PT Re-Evaluation  07/04/18    Authorization Type  Progress note every 10th visit    PT Start Time  1430    PT Stop Time  1520    PT Time Calculation (min)  50 min    Activity Tolerance  Patient tolerated treatment well    Behavior During Therapy  [facility] for tasks assessed/performed       Past Medical History:  Diagnosis Date  . Allergy   . Anxiety   . Depression   . Mini stroke ([place]) 5/ 2015  . Salivary stones   . Thyroid disease     Past Surgical History:  Procedure Laterality Date  . ABDOMINAL HYSTERECTOMY  11 / 1997   partial   . [place]  . TOOTH EXTRACTION Left     There were no vitals filed for this visit.  Subjective Assessment - 06/15/18 1432    Subjective  Patient reports feeling tired and she didn't get much sleep last night. She states she felt more sore after starting the strenthening exercises.    Limitations  Lifting;House hold activities    Diagnostic tests  X-ray negative    Patient Stated Goals  use arm again without pain    Currently in Pain?  Yes    Pain Score  2     Pain Location  Shoulder    Pain Orientation  Right    Pain Descriptors / Indicators  Sore    Pain Onset  More than a month ago    Pain Frequency  Constant         [facility] PT Assessment - 06/15/18 0001      Assessment   Medical Diagnosis  Adhesive Capsulitis of right shoulder    Hand Dominance  Right    Next MD Visit  06/22/18                   [facility] Adult PT Treatment/Exercise - 06/15/18 0001      Exercises   Exercises  Shoulder      Shoulder  Exercises: Standing   Protraction  Strengthening;Right;10 reps;Theraband    Theraband Level (Shoulder Protraction)  Level 1 (Yellow)    External Rotation  Strengthening;Right;10 reps;Theraband    Theraband Level (Shoulder External Rotation)  Level 1 (Yellow)    Internal Rotation  Strengthening;Right;10 reps;Theraband    Theraband Level (Shoulder Internal Rotation)  Level 1 (Yellow)    Extension  Strengthening;Right;10 reps;Theraband    Theraband Level (Shoulder Extension)  Level 1 (Yellow)    Row  Strengthening;Right;10 reps;Theraband    Theraband Level (Shoulder Row)  Level 1 (Yellow)      Shoulder Exercises: ROM/Strengthening   UBE (Upper Arm Bike)  120 RPM x8 minutes (4 fwd, 4 bwd)    Wall Wash  flexion x20      Modalities   Modalities  Electrical Stimulation;Vasopneumatic      Electrical Stimulation   Electrical Stimulation Location  right shoulder    Electrical Stimulation Action  IFC    Electrical Stimulation Parameters  80-150 hz  x15 min    Electrical Stimulation Goals  Pain      Vasopneumatic   Number Minutes Vasopneumatic   15 minutes    Vasopnuematic Location   Shoulder    Vasopneumatic Pressure  Low    Vasopneumatic Temperature   34      Manual Therapy   Manual Therapy  Passive ROM    Passive ROM  PROM in flexion, ER, IR and hori abd to improve ROM. rhythmic stabilization at 45 90 and 120 degrees 15" x3                  PT Long Term Goals - 06/10/18 0743      PT LONG TERM GOAL #1   Title  Patient will be independent with HEP and it's progression    Time  4    Period  Weeks    Status  On-going      PT LONG TERM GOAL #2   Title  Patient will demonstrate 120+ degrees of right shoulder flexion AROM to improve ability to perform overhead activities.    Time  4    Period  Weeks    Status  Achieved      PT LONG TERM GOAL #3   Title  Patient will demonstrate 70+ degrees of right shoulder ER AROM to improve ability to don/doff apparel.    Time  4     Period  Weeks    Status  On-going      PT LONG TERM GOAL #4   Title  Patient will demonstrate functional IR to T10 or higher to improve ability to don/doff bra.    Time  4    Period  Weeks    Status  On-going      PT LONG TERM GOAL #5   Title  Patient will demonstrate 4/5 or greater right shoulder MMT in all planes to improve stability during functional tasks.     Time  4    Period  Weeks    Status  On-going      PT LONG TERM GOAL #6   Title  Patient will improve right grip strength to 45 lbs or greater or equal to left to improve ability to perform functional tasks.    Time  4    Period  Weeks            Plan - 06/15/18 1509    Clinical Impression Statement  Patient was able to tolerate treatment well with no reports of increased pain. Patient required tactile cuing for proper technique of ER with theraband and was able to demonstrate good form after cuing. Patient noted with smooth arc of motion with PROM. Normal response to modalities upon removal.     Clinical Presentation  Stable    Clinical Decision Making  Low    Rehab Potential  Good    PT Frequency  3x / week    PT Duration  4 weeks    PT Treatment/Interventions  ADLs/Self Care Home Management;Iontophoresis 4mg /ml Dexamethasone;Moist Heat;Ultrasound;Cryotherapy;Electrical Stimulation;Therapeutic exercise;Therapeutic activities;Functional mobility training;Neuromuscular re-education;Manual techniques;Passive range of motion;Patient/family education;Vasopneumatic Device;Taping    PT Next Visit Plan  cont. strengthening exercises to tolerance. UE ranger for AAROM, PROM, vaso and e-stim for pain relief    Consulted and Agree with Plan of Care  Patient       Patient will benefit from skilled therapeutic intervention in order to improve the following deficits and impairments:  Pain, Impaired UE functional use, Decreased activity tolerance,  Decreased endurance, Decreased range of motion, Decreased strength, Postural  dysfunction  Visit Diagnosis: Stiffness of right shoulder, not elsewhere classified  Muscle weakness (generalized)  Acute pain of right shoulder     Problem List Patient Active Problem List   Diagnosis Date Noted  . Presbycusis of both ears 10/20/2016  . Sialolithiasis of submandibular gland 10/20/2016  . Tinnitus aurium, 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  . Hypothyroidism 04/20/2016  . Overweight (BMI 25.0-29.9) 02/20/2016   Gabriela Eves, PT, DPT 06/15/2018, 3:31 PM  [facility] [address] [place], [place], [address] Phone: [phone]   Fax:  [phone]  Name: Annette Silva MRN: [id number] Date of Birth: [birth year]

## 2018-06-16 ENCOUNTER — Encounter: Payer: Self-pay | Admitting: Physical Therapy

## 2018-06-16 ENCOUNTER — Ambulatory Visit: Admitting: Physical Therapy

## 2018-06-16 DIAGNOSIS — M25511 Pain in right shoulder: Secondary | ICD-10-CM

## 2018-06-16 DIAGNOSIS — M6281 Muscle weakness (generalized): Secondary | ICD-10-CM

## 2018-06-16 DIAGNOSIS — M25611 Stiffness of right shoulder, not elsewhere classified: Secondary | ICD-10-CM

## 2018-06-16 NOTE — Therapy (Signed)
[facility] [place], [place], [address] Phone: [phone]   Fax:  [phone]  Physical Therapy Treatment  Patient Details  Name: Annette Silva MRN: [id number] Date of Birth: [birth year] Referring Provider (PT): Justice Britain, MD   Encounter Date: 06/16/2018  PT End of Session - 06/16/18 1755    Visit Number  9    Number of Visits  12    Date for PT Re-Evaluation  07/04/18    Authorization Type  Progress note every 10th visit    PT Start Time  1600    PT Stop Time  1657    PT Time Calculation (min)  57 min    Activity Tolerance  Patient tolerated treatment well    Behavior During Therapy  [facility] for tasks assessed/performed       Past Medical History:  Diagnosis Date  . Allergy   . Anxiety   . Depression   . Mini stroke ([place]) 5/ 2015  . Salivary stones   . Thyroid disease     Past Surgical History:  Procedure Laterality Date  . ABDOMINAL HYSTERECTOMY  11 / 1997   partial   . [place]  . TOOTH EXTRACTION Left     There were no vitals filed for this visit.  Subjective Assessment - 06/16/18 1754    Subjective  Patient states she's a little more sore today and has some more pain in her shoulder. She states she woke up at 3 am due to pain.     Limitations  Lifting;House hold activities    Diagnostic tests  X-ray negative    Patient Stated Goals  use arm again without pain    Currently in Pain?  Yes    Pain Score  3     Pain Location  Shoulder    Pain Orientation  Right    Pain Descriptors / Indicators  Sore    Pain Type  Chronic pain    Pain Onset  More than a month ago         [facility] PT Assessment - 06/16/18 0001      Assessment   Medical Diagnosis  Adhesive Capsulitis of right shoulder    Hand Dominance  Right    Next MD Visit  06/22/18                   [facility] Adult PT Treatment/Exercise - 06/16/18 0001      Exercises   Exercises  Shoulder      Shoulder Exercises:  Standing   Protraction  Strengthening;Right;10 reps;Theraband    External Rotation  Strengthening;Right;10 reps;Theraband    Theraband Level (Shoulder External Rotation)  Level 1 (Yellow)    Internal Rotation  Strengthening;Right;10 reps;Theraband    Theraband Level (Shoulder Internal Rotation)  Level 1 (Yellow)    Row  Strengthening;Right;Theraband;20 reps    Theraband Level (Shoulder Row)  Level 1 (Yellow)      Shoulder Exercises: ROM/Strengthening   UBE (Upper Arm Bike)  120 RPM x8 minutes (4 fwd, 4 bwd)      Modalities   Modalities  Electrical Stimulation;Vasopneumatic;Ultrasound      Electrical Stimulation   Electrical Stimulation Location  right shoulder    Electrical Stimulation Action  IFC    Electrical Stimulation Parameters  80-150 hz x15 min    Electrical Stimulation Goals  Pain      Ultrasound   Ultrasound Location  anterior shouler    Ultrasound Parameters  combo US/E-stim  100%,1 mhz, 1.5 w/cm2    Ultrasound Goals  Pain      Vasopneumatic   Number Minutes Vasopneumatic   15 minutes    Vasopnuematic Location   Shoulder    Vasopneumatic Pressure  Low    Vasopneumatic Temperature   34      Manual Therapy   Manual Therapy  Passive ROM    Passive ROM  PROM in flexion, ER, IR to improve ROM                  PT Long Term Goals - 06/10/18 0743      PT LONG TERM GOAL #1   Title  Patient will be independent with HEP and it's progression    Time  4    Period  Weeks    Status  On-going      PT LONG TERM GOAL #2   Title  Patient will demonstrate 120+ degrees of right shoulder flexion AROM to improve ability to perform overhead activities.    Time  4    Period  Weeks    Status  Achieved      PT LONG TERM GOAL #3   Title  Patient will demonstrate 70+ degrees of right shoulder ER AROM to improve ability to don/doff apparel.    Time  4    Period  Weeks    Status  On-going      PT LONG TERM GOAL #4   Title  Patient will demonstrate functional IR to T10  or higher to improve ability to don/doff bra.    Time  4    Period  Weeks    Status  On-going      PT LONG TERM GOAL #5   Title  Patient will demonstrate 4/5 or greater right shoulder MMT in all planes to improve stability during functional tasks.     Time  4    Period  Weeks    Status  On-going      PT LONG TERM GOAL #6   Title  Patient will improve right grip strength to 45 lbs or greater or equal to left to improve ability to perform functional tasks.    Time  4    Period  Weeks            Plan - 06/16/18 1758    Clinical Impression Statement  Patient was able to tolerate treatment fairly well. Patient was able to demonstrate good form with all theraband exercises. Trialed combo e-stim/US to which patient reported a decrease in pain after. Patient noted with increased resistance at end range shoulder flexion PROM. Normal response to modalities upon removal.    Clinical Presentation  Stable    Clinical Decision Making  Low    Rehab Potential  Good    PT Frequency  3x / week    PT Duration  4 weeks    PT Treatment/Interventions  ADLs/Self Care Home Management;Iontophoresis 4mg /ml Dexamethasone;Moist Heat;Ultrasound;Cryotherapy;Electrical Stimulation;Therapeutic exercise;Therapeutic activities;Functional mobility training;Neuromuscular re-education;Manual techniques;Passive range of motion;Patient/family education;Vasopneumatic Device;Taping    PT Next Visit Plan  assess response to Combo. cont. strengthening exercises to tolerance. UE ranger for AAROM, PROM, vaso and e-stim for pain relief    Consulted and Agree with Plan of Care  Patient       Patient will benefit from skilled therapeutic intervention in order to improve the following deficits and impairments:  Pain, Impaired UE functional use, Decreased activity tolerance, Decreased endurance, Decreased range of motion, Decreased strength, Postural  dysfunction  Visit Diagnosis: Stiffness of right shoulder, not elsewhere  classified  Muscle weakness (generalized)  Acute pain of right shoulder     Problem List Patient Active Problem List   Diagnosis Date Noted  . Presbycusis of both ears 10/20/2016  . Sialolithiasis of submandibular gland 10/20/2016  . Tinnitus aurium, 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  . Hypothyroidism 04/20/2016  . Overweight (BMI 25.0-29.9) 02/20/2016   Gabriela Eves, PT, DPT 06/16/2018, 9:21 PM  [facility] [address] [place], [place], [address] Phone: [phone]   Fax:  [phone]  Name: Maiko Salais MRN: [id number] Date of Birth: 1961/02/27

## 2018-06-17 ENCOUNTER — Encounter: Admitting: Physical Therapy

## 2018-06-20 ENCOUNTER — Ambulatory Visit: Admitting: Physical Therapy

## 2018-06-20 DIAGNOSIS — M6281 Muscle weakness (generalized): Secondary | ICD-10-CM

## 2018-06-20 DIAGNOSIS — M25511 Pain in right shoulder: Secondary | ICD-10-CM

## 2018-06-20 DIAGNOSIS — M25611 Stiffness of right shoulder, not elsewhere classified: Secondary | ICD-10-CM

## 2018-06-20 NOTE — Therapy (Signed)
[facility] [place], [place], [address] Phone: [phone]   Fax:  [phone]  Physical Therapy Treatment  Patient Details  Name: Annette Silva MRN: [id number] Date of Birth: 05-Dec-1960 Referring Provider (PT): Justice Britain, MD   Encounter Date: 06/20/2018  PT End of Session - 06/20/18 1448    Visit Number  10    Number of Visits  12    Date for PT Re-Evaluation  07/04/18    Authorization Type  Progress note every 10th visit    PT Start Time  1430    PT Stop Time  1525    PT Time Calculation (min)  55 min    Activity Tolerance  Patient tolerated treatment well    Behavior During Therapy  [facility] for tasks assessed/performed       Past Medical History:  Diagnosis Date  . Allergy   . Anxiety   . Depression   . Mini stroke ([place]) 5/ 2015  . Salivary stones   . Thyroid disease     Past Surgical History:  Procedure Laterality Date  . ABDOMINAL HYSTERECTOMY  11 / 1997   partial   . [place]  . TOOTH EXTRACTION Left     There were no vitals filed for this visit.  Subjective Assessment - 06/20/18 1525    Subjective  Patient reported feeling sore but states she has noted improvments in function since the start of therapy.    Limitations  Lifting;House hold activities    Diagnostic tests  X-ray negative    Patient Stated Goals  use arm again without pain    Currently in Pain?  Yes    Pain Score  2     Pain Location  Shoulder    Pain Orientation  Right    Pain Descriptors / Indicators  Sore    Pain Type  Chronic pain    Pain Onset  More than a month ago    Pain Frequency  Constant         [facility] PT Assessment - 06/20/18 0001      Assessment   Medical Diagnosis  Adhesive Capsulitis of right shoulder    Hand Dominance  Right    Next MD Visit  06/22/18      ROM / Strength   AROM / PROM / Strength  AROM;Strength      AROM   Overall AROM   Within functional limits for tasks performed    AROM  Assessment Site  Shoulder    Right/Left Shoulder  Right    Right Shoulder Internal Rotation  78 Degrees    Right Shoulder External Rotation  70 Degrees      Strength   Right Shoulder Flexion  3+/5    Right Shoulder ABduction  3+/5    Right Shoulder Internal Rotation  4-/5    Right Shoulder External Rotation  4/5    Right Hand Grip (lbs)  25    Left Hand Grip (lbs)  45      Palpation   Palpation comment  increased muscle tension and tone along right biceps and  around insertion of deltoid                   [facility] Adult PT Treatment/Exercise - 06/20/18 0001      Shoulder Exercises: Standing   Protraction  Strengthening;Right;10 reps;Theraband    Theraband Level (Shoulder Protraction)  Level 1 (Yellow)    External Rotation  Strengthening;Right;10 reps;Theraband  Theraband Level (Shoulder External Rotation)  Level 1 (Yellow)    Internal Rotation  Strengthening;Right;10 reps;Theraband    Theraband Level (Shoulder Internal Rotation)  Level 1 (Yellow)    Row  Strengthening;Right;Theraband;20 reps    Theraband Level (Shoulder Row)  Level 1 (Yellow)      Shoulder Exercises: ROM/Strengthening   UBE (Upper Arm Bike)  90 RPM x8 min (4 fwd, 4bwd)      Modalities   Modalities  Electrical Stimulation;Vasopneumatic;Ultrasound      Electrical Stimulation   Electrical Stimulation Location  right biceps and deloid insertion    Electrical Stimulation Action  Pre-mod    Electrical Stimulation Parameters  80-150 hz x10 min    Electrical Stimulation Goals  Pain      Vasopneumatic   Number Minutes Vasopneumatic   10 minutes    Vasopnuematic Location   Shoulder    Vasopneumatic Pressure  Low    Vasopneumatic Temperature   34      Manual Therapy   Manual Therapy  Passive ROM;Soft tissue mobilization    Soft tissue mobilization  STW/M to biceps and deltoid insertion at area of trigger point to decrease pain and decrease muscle tone    Passive ROM  PROM in ER and IR to improve ROM                   PT Long Term Goals - 06/20/18 1445      PT LONG TERM GOAL #1   Title  Patient will be independent with HEP and it's progression    Time  4    Period  Weeks    Status  On-going      PT LONG TERM GOAL #2   Title  Patient will demonstrate 120+ degrees of right shoulder flexion AROM to improve ability to perform overhead activities.    Time  4    Period  Weeks    Status  Achieved      PT LONG TERM GOAL #3   Title  Patient will demonstrate 70+ degrees of right shoulder ER AROM to improve ability to don/doff apparel.    Time  4    Period  Weeks    Status  Achieved      PT LONG TERM GOAL #4   Title  Patient will demonstrate functional IR to T10 or higher to improve ability to don/doff bra.    Time  4    Period  Weeks    Status  Achieved      PT LONG TERM GOAL #5   Title  Patient will demonstrate 4/5 or greater right shoulder MMT in all planes to improve stability during functional tasks.     Time  4    Period  Weeks    Status  On-going      PT LONG TERM GOAL #6   Title  Patient will improve right grip strength to 45 lbs or greater or equal to left to improve ability to perform functional tasks.    Time  4    Period  Weeks    Status  On-going            Plan - 06/20/18 1526    Clinical Impression Statement  Patient was able to tolerate treatment well despite increase of pain. Patient was able to demonstrate good form with all exercises. Patient noted with increased muscle tone in insertion point of delotoid as well as in bieps muscle; STW/M performed to decrease tone. Patient  noted with decreased pain at end of session. Patient is able to perform full AROM in all planes with limited pain. Strength goals are ongoing at this time. Normal response to modalities upon removal.     Clinical Presentation  Stable    Clinical Decision Making  Low    Rehab Potential  Good    PT Frequency  3x / week    PT Duration  4 weeks    PT Treatment/Interventions   ADLs/Self Care Home Management;Iontophoresis 4mg /ml Dexamethasone;Moist Heat;Ultrasound;Cryotherapy;Electrical Stimulation;Therapeutic exercise;Therapeutic activities;Functional mobility training;Neuromuscular re-education;Manual techniques;Passive range of motion;Patient/family education;Vasopneumatic Device;Taping    PT Next Visit Plan  MD note sent to Dr. Onnie Graham for 06/22/18 appt. cont. strengthening exercises to tolerance. UE ranger for AAROM, PROM, vaso and e-stim for pain relief    Consulted and Agree with Plan of Care  Patient       Patient will benefit from skilled therapeutic intervention in order to improve the following deficits and impairments:  Pain, Impaired UE functional use, Decreased activity tolerance, Decreased endurance, Decreased range of motion, Decreased strength, Postural dysfunction  Visit Diagnosis: Acute pain of right shoulder  Muscle weakness (generalized)  Stiffness of right shoulder, not elsewhere classified     Problem List Patient Active Problem List   Diagnosis Date Noted  . Presbycusis of both ears 10/20/2016  . Sialolithiasis of submandibular gland 10/20/2016  . Tinnitus aurium, 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  . Hypothyroidism 04/20/2016  . Overweight (BMI 25.0-29.9) 02/20/2016   Gabriela Eves, PT, DPT 06/20/2018, 3:37 PM  [facility] [address] [place], [place], [address] Phone: [phone]   Fax:  [phone]  Name: Annette Silva MRN: [id number] Date of Birth: 06/27/1961

## 2018-06-22 ENCOUNTER — Encounter: Payer: Self-pay | Admitting: Physical Therapy

## 2018-06-22 ENCOUNTER — Ambulatory Visit: Admitting: Physical Therapy

## 2018-06-22 DIAGNOSIS — M25511 Pain in right shoulder: Secondary | ICD-10-CM

## 2018-06-22 DIAGNOSIS — M6281 Muscle weakness (generalized): Secondary | ICD-10-CM

## 2018-06-22 DIAGNOSIS — M25611 Stiffness of right shoulder, not elsewhere classified: Secondary | ICD-10-CM

## 2018-06-22 NOTE — Therapy (Signed)
[facility] [place], [place], [address] Phone: [phone]   Fax:  [phone]  Physical Therapy Treatment/Discharge  Patient Details  Name: Annette Silva MRN: [id number] Date of Birth: November 15, 1960 Referring Provider (PT): Justice Britain, MD   Encounter Date: 06/22/2018  PT End of Session - 06/22/18 2010    Visit Number  11    Number of Visits  12    Date for PT Re-Evaluation  07/04/18    Authorization Type  Progress note every 10th visit    PT Start Time  1430    PT Stop Time  1517    PT Time Calculation (min)  47 min    Activity Tolerance  Patient tolerated treatment well    Behavior During Therapy  [facility] for tasks assessed/performed       Past Medical History:  Diagnosis Date  . Allergy   . Anxiety   . Depression   . Mini stroke ([place]) 5/ 2015  . Salivary stones   . Thyroid disease     Past Surgical History:  Procedure Laterality Date  . ABDOMINAL HYSTERECTOMY  11 / 1997   partial   . [place]  . TOOTH EXTRACTION Left     There were no vitals filed for this visit.  Subjective Assessment - 06/22/18 1521    Subjective  Patient reported MD's follow up went well and requested today be the last visit.    Limitations  Lifting;House hold activities    Diagnostic tests  X-ray negative    Patient Stated Goals  use arm again without pain    Currently in Pain?  Yes    Pain Score  2     Pain Location  Shoulder    Pain Orientation  Right    Pain Descriptors / Indicators  Sore    Pain Type  Chronic pain    Pain Onset  More than a month ago    Pain Frequency  Intermittent         [facility] PT Assessment - 06/22/18 0001      Assessment   Medical Diagnosis  Adhesive Capsulitis of right shoulder    Hand Dominance  Right                   [facility] Adult PT Treatment/Exercise - 06/22/18 0001      Exercises   Exercises  Shoulder      Shoulder Exercises: Standing   Protraction   Strengthening;Right;10 reps;Theraband    Theraband Level (Shoulder Protraction)  Level 1 (Yellow)    Horizontal ABduction  Strengthening;Both;10 reps;Theraband    Theraband Level (Shoulder Horizontal ABduction)  Level 1 (Yellow)    External Rotation  Strengthening;Right;10 reps;Theraband    Theraband Level (Shoulder External Rotation)  Level 1 (Yellow)    Internal Rotation  Strengthening;Right;10 reps;Theraband    Theraband Level (Shoulder Internal Rotation)  Level 1 (Yellow)    Extension  Strengthening;Right;10 reps;Theraband    Theraband Level (Shoulder Extension)  Level 1 (Yellow)    Row  Strengthening;Right;Theraband;10 reps    Theraband Level (Shoulder Row)  Level 1 (Yellow)    Diagonals  Strengthening;Right;10 reps    Theraband Level (Shoulder Diagonals)  Level 1 (Yellow)    Diagonals Limitations  D2 flexion and extension      Shoulder Exercises: ROM/Strengthening   UBE (Upper Arm Bike)  90 RPM x8 min (4 fwd, 4bwd)      Manual Therapy   Manual Therapy  Passive ROM;Soft  tissue mobilization    Passive ROM  PROM in ER and IR to improve ROM with intermittent oscillations to promote relaxation; rhythmic stabilzation at 45 90 120 degrees 3x30" each             PT Education - 06/22/18 1523    Education Details  rockwood 4, D2 flexion/extension, horizontal abd, bicep and hammer curls    Person(s) Educated  Patient    Methods  Explanation;Demonstration;Verbal cues;Handout    Comprehension  Verbalized understanding;Returned demonstration          PT Long Term Goals - 06/22/18 2013      PT LONG TERM GOAL #1   Title  Patient will be independent with HEP and it's progression    Time  4    Period  Weeks    Status  Achieved      PT LONG TERM GOAL #2   Title  Patient will demonstrate 120+ degrees of right shoulder flexion AROM to improve ability to perform overhead activities.    Time  4    Period  Weeks    Status  Achieved      PT LONG TERM GOAL #3   Title  Patient  will demonstrate 70+ degrees of right shoulder ER AROM to improve ability to don/doff apparel.    Time  4    Period  Weeks    Status  Achieved      PT LONG TERM GOAL #4   Title  Patient will demonstrate functional IR to T10 or higher to improve ability to don/doff bra.    Time  4    Period  Weeks    Status  Achieved      PT LONG TERM GOAL #5   Title  Patient will demonstrate 4/5 or greater right shoulder MMT in all planes to improve stability during functional tasks.     Time  4    Period  Weeks    Status  Not Met      PT LONG TERM GOAL #6   Title  Patient will improve right grip strength to 45 lbs or greater or equal to left to improve ability to perform functional tasks.    Time  4    Period  Weeks    Status  Not Met            Plan - 06/22/18 2010    Clinical Impression Statement  Patient was able to tolerate treatment well with minimal reports of increased pain. Patient's last visit emphasized on good form and technique of HEP provided today. Patient was able to demonstrate good form with all exercises as well as with new exercises provided. Patient was instructed to perform HEP to maintain gains made in PT as well as improve strength and muscle endurance. Patient reported understading. Patient's goals were partially met at this time. DC.    Clinical Presentation  Stable    Clinical Decision Making  Low    Rehab Potential  Good    PT Frequency  3x / week    PT Duration  4 weeks    PT Treatment/Interventions  ADLs/Self Care Home Management;Iontophoresis 20m/ml Dexamethasone;Moist Heat;Ultrasound;Cryotherapy;Electrical Stimulation;Therapeutic exercise;Therapeutic activities;Functional mobility training;Neuromuscular re-education;Manual techniques;Passive range of motion;Patient/family education;Vasopneumatic Device;Taping    PT Next Visit Plan  DC    Consulted and Agree with Plan of Care  Patient       Patient will benefit from skilled therapeutic intervention in order to  improve the following deficits and impairments:  Pain, Impaired UE functional use, Decreased activity tolerance, Decreased endurance, Decreased range of motion, Decreased strength, Postural dysfunction  Visit Diagnosis: Acute pain of right shoulder  Muscle weakness (generalized)  Stiffness of right shoulder, not elsewhere classified     Problem List Patient Active Problem List   Diagnosis Date Noted  . Presbycusis of both ears 10/20/2016  . Sialolithiasis of submandibular gland 10/20/2016  . Tinnitus aurium, 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  . Hypothyroidism 04/20/2016  . Overweight (BMI 25.0-29.9) 02/20/2016   Gabriela Eves, PT, DPT 06/22/2018, 8:19 PM  [facility] [address] [place], [place], [address] Phone: [phone]   Fax:  [phone]  Name: Francis Yardley MRN: [id number] Date of Birth: 1961-02-25

## 2018-07-15 ENCOUNTER — Other Ambulatory Visit: Payer: Self-pay | Admitting: Pediatrics

## 2018-07-15 DIAGNOSIS — Z1231 Encounter for screening mammogram for malignant neoplasm of breast: Secondary | ICD-10-CM

## 2018-09-02 ENCOUNTER — Ambulatory Visit
Admission: RE | Admit: 2018-09-02 | Discharge: 2018-09-02 | Disposition: A | Discharge status: Home / Self Care | Source: Ambulatory Visit | Attending: Pediatrics | Admitting: Pediatrics

## 2018-09-02 DIAGNOSIS — Z1231 Encounter for screening mammogram for malignant neoplasm of breast: Secondary | ICD-10-CM

## 2018-10-01 ENCOUNTER — Ambulatory Visit (INDEPENDENT_AMBULATORY_CARE_PROVIDER_SITE_OTHER): Admitting: Family Medicine

## 2018-10-01 VITALS — BP 127/84 | HR 71 | Temp 97.1°F | Ht 65.75 in | Wt 140.4 lb

## 2018-10-01 DIAGNOSIS — Z13220 Encounter for screening for lipoid disorders: Secondary | ICD-10-CM | POA: Diagnosis not present

## 2018-10-01 DIAGNOSIS — M79601 Pain in right arm: Secondary | ICD-10-CM | POA: Diagnosis not present

## 2018-10-01 MED ORDER — PREDNISONE 10 MG PO TABS: ORAL_TABLET | ORAL | 0 refills | Status: DC

## 2018-10-01 NOTE — Addendum Note (Signed)
Addended by: Claretta Fraise on: 10/01/2018 10:52 AM   Modules accepted: Orders

## 2018-10-01 NOTE — Progress Notes (Addendum)
Chief Complaint  Patient presents with  . Arm Pain    pt here today c/o right arm pain since July, has seen Dr Evette Doffing for it, hasn't gotten any better and over the last week it has gotten worse    HPI  Patient presents today for right arm pain ongoing since about June of last year.  She saw Dr. Evette Doffing and had an x-ray and conservative treatment without any success a few weeks later she went to orthopedics he injected the right shoulder with presumptive diagnosis of frozen shoulder.  Patient had lost some range of motion and she got that back.  However, at this time she still cannot internally rotate.  She has a constant soreness rated 3-4/10 and when it gets bumped or jolted she will feel a 10 out of 10 pain that takes her breath it hurts so bad this is at the upper arm laterally about where the deltoid inserts into the humerus.  Of note is that her father died of bone cancer.  She has a friend who has had multiple myeloma recently.  The friend symptoms were very similar to hers.  She would like to be checked for both.  A chiropractor that she saw also expressed concern that the pain may be radiating from the neck.  She does have a history of some arthritic pain of the neck as well.  Physical therapy notes were reviewed through September and October of last year showing good compliance with treatment but only minimal improvement it was noted.  PMH: Smoking status noted ROS: Review of Systems  Constitutional: Negative for fever.  HENT: Negative for congestion, rhinorrhea and sore throat.   Respiratory: Negative for cough and shortness of breath.   Cardiovascular: Negative for chest pain and palpitations.  Gastrointestinal: Negative for abdominal pain.  Musculoskeletal: Positive for arthralgias and myalgias.    Objective: BP 127/84   Pulse 71   Temp (!) 97.1 F (36.2 C) (Oral)   Ht 5' 5.75" (1.67 m)   Wt 140 lb 6 oz (63.7 kg)   LMP 07/01/1996   BMI 22.83 kg/m  Gen: NAD, alert,  cooperative with exam HEENT: NCAT, EOMI, PERRL CV: RRR, good S1/S2, no murmur Resp: CTABL, no wheezes, non-labored Ext: No edema, warm.  There is tenderness at the right arm, lateral deltoid insertion.  There is also tenderness for internal rotation of the right shoulder particularly with elbow flexion.  The extremity is neurovascularly intact grossly. Neuro: Alert and oriented, No gross deficits  Assessment and plan:  1. Right arm pain   2. Screening for cholesterol level     Meds ordered this encounter  Medications  . predniSONE (DELTASONE) 10 MG tablet    Sig: Take 5 daily for 3 days followed by 4,3,2 and 1 for 3 days each.    Dispense:  45 tablet    Refill:  0    Orders Placed This Encounter  Procedures  . MR HUMERUS RIGHT WO CONTRAST    Standing Status:   Future    Standing Expiration Date:   11/30/2019    Order Specific Question:   What is the patient's sedation requirement?    Answer:   No Sedation    Order Specific Question:   Does the patient have a pacemaker or implanted devices?    Answer:   No    Order Specific Question:   Preferred imaging location?    Answer:   Internal    Order Specific Question:   Radiology  Contrast Protocol - do NOT remove file path    Answer:   [email or web address]  . Protein electrophoresis, serum  . CBC with Differential/Platelet  . CMP14+EGFR  . MULTIPLE MYELOMA PROFILE  . Lipid panel  . Immunofixation electrophoresis  . Nerve conduction test    Pain radiating to right upper arm from neck    Standing Status:   Future    Standing Expiration Date:   11/30/2018    Order Specific Question:   Where should this test be performed?    Answer:   [facility]    Follow up as needed.  Claretta Fraise, MD

## 2018-10-05 LAB — IMMUNOFIXATION ELECTROPHORESIS
IGG (IMMUNOGLOBIN G), SERUM: 874 mg/dL (ref 700–1600)
IGM (IMMUNOGLOBULIN M), SRM: 71 mg/dL (ref 26–217)
IgA/Immunoglobulin A, Serum: 246 mg/dL (ref 87–352)
Total Protein: 6.8 g/dL (ref 6.0–8.5)

## 2018-10-06 ENCOUNTER — Other Ambulatory Visit: Payer: Self-pay

## 2018-10-06 ENCOUNTER — Encounter: Payer: Self-pay | Admitting: Neurology

## 2018-10-06 DIAGNOSIS — M79601 Pain in right arm: Secondary | ICD-10-CM

## 2018-10-07 LAB — MULTIPLE MYELOMA PROFILE
CELLS ANALYZED: 0
CELLS COUNTED: 0

## 2018-10-07 LAB — CBC WITH DIFFERENTIAL/PLATELET
BASOS ABS: 0 10*3/uL (ref 0.0–0.2)
Basos: 1 %
EOS (ABSOLUTE): 0.1 10*3/uL (ref 0.0–0.4)
Eos: 3 %
Hematocrit: 40.6 % (ref 34.0–46.6)
Hemoglobin: 13.9 g/dL (ref 11.1–15.9)
IMMATURE GRANS (ABS): 0 10*3/uL (ref 0.0–0.1)
Immature Granulocytes: 0 %
LYMPHS: 41 %
Lymphocytes Absolute: 1.5 10*3/uL (ref 0.7–3.1)
MCH: 28.8 pg (ref 26.6–33.0)
MCHC: 34.2 g/dL (ref 31.5–35.7)
MCV: 84 fL (ref 79–97)
MONOS ABS: 0.3 10*3/uL (ref 0.1–0.9)
Monocytes: 8 %
NEUTROS ABS: 1.7 10*3/uL (ref 1.4–7.0)
Neutrophils: 47 %
PLATELETS: 232 10*3/uL (ref 150–450)
RBC: 4.83 x10E6/uL (ref 3.77–5.28)
RDW: 12.8 % (ref 11.7–15.4)
WBC: 3.6 10*3/uL (ref 3.4–10.8)

## 2018-10-07 LAB — CMP14+EGFR
A/G RATIO: 2 (ref 1.2–2.2)
ALK PHOS: 73 IU/L (ref 39–117)
ALT: 16 IU/L (ref 0–32)
AST: 16 IU/L (ref 0–40)
Albumin: 4.5 g/dL (ref 3.8–4.9)
BILIRUBIN TOTAL: 0.5 mg/dL (ref 0.0–1.2)
BUN/Creatinine Ratio: 13 (ref 9–23)
BUN: 12 mg/dL (ref 6–24)
CHLORIDE: 104 mmol/L (ref 96–106)
CO2: 25 mmol/L (ref 20–29)
Calcium: 9.7 mg/dL (ref 8.7–10.2)
Creatinine, Ser: 0.9 mg/dL (ref 0.57–1.00)
GFR calc non Af Amer: 70 mL/min/{1.73_m2} (ref >59)
GFR, EST AFRICAN AMERICAN: 81 mL/min/{1.73_m2} (ref >59)
GLUCOSE: 94 mg/dL (ref 65–99)
Globulin, Total: 2.2 g/dL (ref 1.5–4.5)
POTASSIUM: 4.5 mmol/L (ref 3.5–5.2)
Sodium: 143 mmol/L (ref 134–144)
TOTAL PROTEIN: 6.7 g/dL (ref 6.0–8.5)

## 2018-10-07 LAB — LIPID PANEL
CHOLESTEROL TOTAL: 194 mg/dL (ref 100–199)
Chol/HDL Ratio: 2.5 ratio (ref 0.0–4.4)
HDL: 77 mg/dL (ref >39)
LDL Calculated: 105 mg/dL — ABNORMAL HIGH (ref 0–99)
Triglycerides: 58 mg/dL (ref 0–149)
VLDL Cholesterol Cal: 12 mg/dL (ref 5–40)

## 2018-10-07 LAB — PROTEIN ELECTROPHORESIS, SERUM
A/G RATIO SPE: 1.6 (ref 0.7–1.7)
ALBUMIN ELP: 4.1 g/dL (ref 2.9–4.4)
Alpha 1: 0.3 g/dL (ref 0.0–0.4)
Alpha 2: 0.6 g/dL (ref 0.4–1.0)
Beta: 1 g/dL (ref 0.7–1.3)
GAMMA GLOBULIN: 0.8 g/dL (ref 0.4–1.8)
GLOBULIN, TOTAL: 2.6 g/dL (ref 2.2–3.9)

## 2018-10-14 ENCOUNTER — Ambulatory Visit (INDEPENDENT_AMBULATORY_CARE_PROVIDER_SITE_OTHER): Admitting: Family Medicine

## 2018-10-14 ENCOUNTER — Ambulatory Visit (HOSPITAL_COMMUNITY)
Admission: RE | Admit: 2018-10-14 | Discharge: 2018-10-14 | Disposition: A | Discharge status: Home / Self Care | Source: Ambulatory Visit | Attending: Family Medicine | Admitting: Family Medicine

## 2018-10-14 ENCOUNTER — Encounter: Payer: Self-pay | Admitting: Family Medicine

## 2018-10-14 ENCOUNTER — Telehealth: Payer: Self-pay | Admitting: Family Medicine

## 2018-10-14 VITALS — BP 111/68 | HR 64 | Temp 98.3°F | Ht 65.75 in | Wt 141.1 lb

## 2018-10-14 DIAGNOSIS — Z0001 Encounter for general adult medical examination with abnormal findings: Secondary | ICD-10-CM

## 2018-10-14 DIAGNOSIS — M79601 Pain in right arm: Secondary | ICD-10-CM | POA: Diagnosis not present

## 2018-10-14 DIAGNOSIS — Z0000 Encounter for general adult medical examination without abnormal findings: Secondary | ICD-10-CM

## 2018-10-14 DIAGNOSIS — B029 Zoster without complications: Secondary | ICD-10-CM

## 2018-10-14 DIAGNOSIS — E039 Hypothyroidism, unspecified: Secondary | ICD-10-CM | POA: Diagnosis not present

## 2018-10-14 LAB — URINALYSIS
Bilirubin, UA: NEGATIVE
Glucose, UA: NEGATIVE
Ketones, UA: NEGATIVE
Leukocytes, UA: NEGATIVE
Nitrite, UA: NEGATIVE
PH UA: 7.5 (ref 5.0–7.5)
Protein, UA: NEGATIVE
RBC, UA: NEGATIVE
Specific Gravity, UA: 1.015 (ref 1.005–1.030)
UUROB: 0.2 mg/dL (ref 0.2–1.0)

## 2018-10-14 MED ORDER — LEVOTHYROXINE SODIUM 50 MCG PO TABS: 50.0000 ug | ORAL_TABLET | Freq: Every day | ORAL | 3 refills | Status: DC

## 2018-10-14 MED ORDER — VALACYCLOVIR HCL 1 G PO TABS: 1000.0000 mg | ORAL_TABLET | Freq: Three times a day (TID) | ORAL | 0 refills | Status: DC

## 2018-10-14 NOTE — Progress Notes (Signed)
Subjective:  Patient ID: Annette Silva, female    DOB: 1961-08-29  Age: 58 y.o. MRN: [id number]  CC: Annual Exam   HPI  Morris Markham presents for Annual physical.  Pain is gone from the right shoulder.  She is concerned that when she finishes the prednisone this weekend that it will come back.  Additionally she had the MRI this morning but that report is still unavailable.  She is had history of shingles and feels similar symptoms at the base of her neck on the back and she has a small rash at the base of the neck at the right posterior region.  Follow-up thyroid patient presents for follow-up on  thyroid. The patient has a history of hypothyroidism for many years. It has been stable recently. Pt. denies any change in  voice, heat or cold intolerance. Energy level has been adequate to good. Patient denies constipation and diarrhea. No myxedema.  She notes that she has been having significant hair loss for about a year.  Her sense of smell seems to be diminished but her sense of taste is preserved.  She does have some postnasal drainage.  Medication is as noted below. Verified that pt is taking it daily on an empty stomach. Well tolerated.  Depression screen [facility] 2/9 10/14/2018 04/06/2018 09/24/2017  Decreased Interest 0 0 0  Down, Depressed, Hopeless 0 0 0  PHQ - 2 Score 0 0 0  Altered sleeping - - -  Tired, decreased energy - - -  Change in appetite - - -  Feeling bad or failure about yourself  - - -  Trouble concentrating - - -  Moving slowly or fidgety/restless - - -  Suicidal thoughts - - -  PHQ-9 Score - - -  Difficult doing work/chores - - -    History Hazyl has a past medical history of Allergy, Anxiety, Depression, Mini stroke ([place]) (5/ 2015), Salivary stones, and Thyroid disease.   She has a past surgical history that includes Abdominal hysterectomy (11 / 1997); Cesarean section (1993); and Tooth extraction (Left).   Her family history includes Arthritis in her mother; COPD in her  mother; Cancer in her father; Depression in her mother; Diabetes in her mother; Hearing loss in her mother; Heart disease in her mother; Hypertension in her mother; Stroke in her mother.She reports that she has never smoked. She has never used smokeless tobacco. She reports current alcohol use. She reports that she does not use drugs.    ROS Review of Systems  Constitutional: Negative for appetite change, chills, diaphoresis, fatigue, fever and unexpected weight change.  HENT: Negative for congestion, ear pain, hearing loss, postnasal drip, rhinorrhea, sneezing, sore throat and trouble swallowing.   Eyes: Negative for pain.  Respiratory: Negative for cough, chest tightness and shortness of breath.   Cardiovascular: Negative for chest pain and palpitations.  Gastrointestinal: Negative for abdominal pain, constipation, diarrhea, nausea and vomiting.  Endocrine: Positive for cold intolerance. Negative for heat intolerance, polydipsia, polyphagia and polyuria.  Genitourinary: Negative for dysuria, frequency and menstrual problem.  Musculoskeletal: Negative for arthralgias and joint swelling.  Skin: Negative for rash.  Allergic/Immunologic: Negative for environmental allergies.  Neurological: Negative for dizziness, weakness, numbness and headaches.  Psychiatric/Behavioral: Negative for agitation and dysphoric mood.    Objective:  BP 111/68   Pulse 64   Temp 98.3 F (36.8 C) (Oral)   Ht 5' 5.75" (1.67 m)   Wt 141 lb 2 oz (64 kg)   LMP 07/01/1996  BMI 22.95 kg/m   BP Readings from Last 3 Encounters:  10/14/18 111/68  10/01/18 127/84  04/06/18 108/72    Wt Readings from Last 3 Encounters:  10/14/18 141 lb 2 oz (64 kg)  10/01/18 140 lb 6 oz (63.7 kg)  04/06/18 136 lb (61.7 kg)     Physical Exam Constitutional:      General: She is not in acute distress.    Appearance: Normal appearance. She is well-developed.  HENT:     Head: Normocephalic and atraumatic.     Right Ear:  External ear normal.     Left Ear: External ear normal.     Nose: Nose normal.  Eyes:     Conjunctiva/sclera: Conjunctivae normal.     Pupils: Pupils are equal, round, and reactive to light.  Neck:     Musculoskeletal: Normal range of motion and neck supple.     Thyroid: No thyromegaly.  Cardiovascular:     Rate and Rhythm: Normal rate and regular rhythm.     Heart sounds: Normal heart sounds. No murmur.  Pulmonary:     Effort: Pulmonary effort is normal. No respiratory distress.     Breath sounds: Normal breath sounds. No wheezing or rales.  Chest:     Breasts: Breasts are symmetrical.        Right: No inverted nipple, mass or tenderness.        Left: No inverted nipple, mass or tenderness.  Abdominal:     General: Bowel sounds are normal. There is no distension or abdominal bruit.     Palpations: Abdomen is soft. There is no hepatomegaly, splenomegaly or mass.     Tenderness: There is no abdominal tenderness. Negative signs include Murphy's sign and McBurney's sign.  Musculoskeletal: Normal range of motion.        General: No tenderness.  Lymphadenopathy:     Cervical: No cervical adenopathy.  Skin:    General: Skin is warm and dry.     Findings: No rash.     Comments: 4 mm X 1 cm vesicular palque consistent with shingles, base of neck, right posterior  Neurological:     Mental Status: She is alert and oriented to person, place, and time.     Deep Tendon Reflexes: Reflexes are normal and symmetric.  Psychiatric:        Behavior: Behavior normal.        Thought Content: Thought content normal.        Judgment: Judgment normal.       Assessment & Plan:   Haylen was seen today for annual exam.  Diagnoses and all orders for this visit:  Well adult exam -     Urinalysis -     TSH -     T4, Free  Hypothyroidism, unspecified type -     levothyroxine (SYNTHROID, LEVOTHROID) 50 MCG tablet; Take 1 tablet (50 mcg total) by mouth daily before breakfast.  Herpes zoster  without complication  Other orders -     valACYclovir (VALTREX) 1000 MG tablet; Take 1 tablet (1,000 mg total) by mouth 3 (three) times daily.       I am having Annette Silva start on valACYclovir. I am also having her maintain her Multiple Vitamins-Minerals (CENTRUM SILVER ADULT 50+ PO), Calcium Carbonate-Vit D-Min (CALCIUM 1200 PO), Cholecalciferol (D3 ADULT PO), vitamin B-12, Fish Oil, Ibuprofen (ADVIL PO), predniSONE, and levothyroxine.  Allergies as of 10/14/2018      Reactions   Benadryl [diphenhydramine Hcl (sleep)]  HYPER      Medication List       Accurate as of October 14, 2018  1:29 PM. Always use your most recent med list.        ADVIL PO Take by mouth as needed.   CALCIUM 1200 PO Take by mouth.   CENTRUM SILVER ADULT 50+ PO Take 1 tablet by mouth daily.   D3 ADULT PO Take 800 mg by mouth daily.   Fish Oil 1200 MG Caps Take 2 capsules by mouth daily.   levothyroxine 50 MCG tablet Commonly known as:  SYNTHROID, LEVOTHROID Take 1 tablet (50 mcg total) by mouth daily before breakfast.   predniSONE 10 MG tablet Commonly known as:  DELTASONE Take 5 daily for 3 days followed by 4,3,2 and 1 for 3 days each.   valACYclovir 1000 MG tablet Commonly known as:  VALTREX Take 1 tablet (1,000 mg total) by mouth 3 (three) times daily.   vitamin B-12 1000 MCG tablet Commonly known as:  CYANOCOBALAMIN Take 1,000 mcg by mouth daily.        Follow-up: Return in about 1 year (around 10/15/2019) for Hypothyroidism.  Claretta Fraise, M.D.

## 2018-10-14 NOTE — Telephone Encounter (Signed)
Resent Valtrex prescription to [facility], patient aware.

## 2018-10-15 LAB — T4, FREE: FREE T4: 1.44 ng/dL (ref 0.82–1.77)

## 2018-10-15 LAB — TSH: TSH: 1.99 u[IU]/mL (ref 0.450–4.500)

## 2018-10-15 NOTE — Progress Notes (Signed)
Hello Annette Silva,  Your lab result is normal.Some minor variations that are not significant are commonly marked abnormal, but do not represent any medical problem for you.  Best regards, Wyndham Santilli, M.D.

## 2018-10-18 ENCOUNTER — Encounter: Admitting: Neurology

## 2018-10-18 ENCOUNTER — Ambulatory Visit: Admitting: Nurse Practitioner

## 2018-10-21 ENCOUNTER — Encounter: Payer: Self-pay | Admitting: Family Medicine

## 2018-10-25 ENCOUNTER — Ambulatory Visit: Admitting: Family Medicine

## 2018-11-01 ENCOUNTER — Ambulatory Visit (INDEPENDENT_AMBULATORY_CARE_PROVIDER_SITE_OTHER): Admitting: Neurology

## 2018-11-01 DIAGNOSIS — M79601 Pain in right arm: Secondary | ICD-10-CM | POA: Diagnosis not present

## 2018-11-01 NOTE — Procedures (Signed)
[facility]  [address]  [place], [place] [address] Tel: [phone] Fax:  [phone] Test Date:  11/01/2018  Patient: Annette Silva DOB: [birth year] Physician: Narda Amber, DO  Sex: Female Height: 5\' 7"  Ref Phys: Claretta Fraise, MD  ID#: [id number] Temp: 35.0C Technician:    Patient Complaints: This is a 58 year old female referred for evaluation of right arm pain.  NCV & EMG Findings: Extensive electrodiagnostic testing of the right upper extremity shows:  1. Right median, ulnar, and mixed palmar sensory responses are within normal limits. 2. Right median and ulnar motor responses are within normal limits. 3. There is no evidence of active or chronic motor axonal loss changes affecting any of the tested muscles.  Motor unit configuration and recruitment pattern is within normal limits.  Impression: This is a normal study of the right upper extremity.  In particular, there is no evidence of carpal tunnel syndrome or a cervical radiculopathy.   ___________________________ Narda Amber, DO    Nerve Conduction Studies Anti Sensory Summary Table   Site NR Peak (ms) Norm Peak (ms) P-T Amp (V) Norm P-T Amp  Right Median Anti Sensory (2nd Digit)  35C  Wrist    3.2 <3.6 28.5 >15  Right Ulnar Anti Sensory (5th Digit)  35C  Wrist    2.5 <3.1 31.0 >10   Motor Summary Table   Site NR Onset (ms) Norm Onset (ms) O-P Amp (mV) Norm O-P Amp Site1 Site2 Delta-0 (ms) Dist (cm) Vel (m/s) Norm Vel (m/s)  Right Median Motor (Abd Poll Brev)  35C  Wrist    3.1 <4.0 9.0 >6 Elbow Wrist 5.0 29.0 58 >50  Elbow    8.1  8.8         Right Ulnar Motor (Abd Dig Minimi)  35C  Wrist    2.1 <3.1 9.3 >7 B Elbow Wrist 3.6 23.0 64 >50  B Elbow    5.7  9.2  A Elbow B Elbow 1.7 10.0 59 >50  A Elbow    7.4  9.0          Comparison Summary Table   Site NR Peak (ms) Norm Peak (ms) P-T Amp (V) Site1 Site2 Delta-P (ms) Norm Delta (ms)  Right Median/Ulnar Palm Comparison (Wrist  - 8cm)  35C  Median Palm    2.0 <2.2 46.8 Median Palm Ulnar Palm 0.3   Ulnar Palm    1.7 <2.2 26.5       EMG   Side Muscle Ins Act Fibs Psw Fasc Number Recrt Dur Dur. Amp Amp. Poly Poly. Comment  Right 1stDorInt Nml Nml Nml Nml Nml Nml Nml Nml Nml Nml Nml Nml N/A  Right Ext Indicis Nml Nml Nml Nml Nml Nml Nml Nml Nml Nml Nml Nml N/A  Right PronatorTeres Nml Nml Nml Nml Nml Nml Nml Nml Nml Nml Nml Nml N/A  Right Biceps Nml Nml Nml Nml Nml Nml Nml Nml Nml Nml Nml Nml N/A  Right Triceps Nml Nml Nml Nml Nml Nml Nml Nml Nml Nml Nml Nml N/A  Right Deltoid Nml Nml Nml Nml Nml Nml Nml Nml Nml Nml Nml Nml N/A  Right Infraspinatus Nml Nml Nml Nml Nml Nml Nml Nml Nml Nml Nml Nml N/A  Right Cervical Parasp Low Nml Nml Nml Nml NE - - - - - - - N/A      Waveforms:

## 2019-04-10 ENCOUNTER — Other Ambulatory Visit: Payer: Self-pay

## 2019-04-11 ENCOUNTER — Encounter: Payer: Self-pay | Admitting: Family Medicine

## 2019-04-11 ENCOUNTER — Ambulatory Visit (INDEPENDENT_AMBULATORY_CARE_PROVIDER_SITE_OTHER): Admitting: Family Medicine

## 2019-04-11 VITALS — BP 99/70 | HR 69 | Temp 97.8°F | Ht 65.75 in | Wt 142.0 lb

## 2019-04-11 DIAGNOSIS — S40262A Insect bite (nonvenomous) of left shoulder, initial encounter: Secondary | ICD-10-CM | POA: Diagnosis not present

## 2019-04-11 DIAGNOSIS — W57XXXA Bitten or stung by nonvenomous insect and other nonvenomous arthropods, initial encounter: Secondary | ICD-10-CM | POA: Diagnosis not present

## 2019-04-11 MED ORDER — DOXYCYCLINE HYCLATE 100 MG PO CAPS: 100.0000 mg | ORAL_CAPSULE | Freq: Two times a day (BID) | ORAL | 0 refills | Status: DC

## 2019-04-11 NOTE — Progress Notes (Signed)
Chief Complaint  Patient presents with  . Insect Bite    left shoulder area - noticed 2 weeks ago     HPI  Patient presents today for bite on the left shoulder occurring 2 weeks ago.  She was visiting a friend in [place] when she noted a little black thing on her posterior left shoulder that looks like a mole.  She put some Neosporin and gauze on it for a day and when she took it off a little black thing with leg started wiggling and she threw it in the toilet.  She never identified it.  But since then the area has had a local reaction with mild redness and a raised bump.  She has not had any fever chills sweats nausea vomiting diarrhea nor has she had a rash.  The local reaction to skin been confined to the immediate bite area.  PMH: Smoking status noted ROS: Per HPI  Objective: BP 99/70   Pulse 69   Temp 97.8 F (36.6 C)   Ht 5' 5.75" (1.67 m)   Wt 142 lb (64.4 kg)   LMP 07/01/1996   BMI 23.09 kg/m  Gen: NAD, alert, cooperative with exam HEENT: NCAT, EOMI, PERRL CV: RRR, good S1/S2, no murmur Resp: CTABL, no wheezes, non-labored Skin: At the posterior left shoulder there is a 1 cm margin of erythema slightly raised with some slight scaling crusting and a central nidus of one  millimeter or less.  There is no rash otherwise. Ext: No edema, warm Neuro: Alert and oriented, No gross deficits  Assessment and plan:  1. Tick bite, initial encounter     Meds ordered this encounter  Medications  . doxycycline (VIBRAMYCIN) 100 MG capsule    Sig: Take 1 capsule (100 mg total) by mouth 2 (two) times daily.    Dispense:  20 capsule    Refill:  0    Orders Placed This Encounter  Procedures  . Lyme Ab/Western Blot Reflex    Follow up as needed.  Claretta Fraise, MD

## 2019-04-18 ENCOUNTER — Ambulatory Visit (INDEPENDENT_AMBULATORY_CARE_PROVIDER_SITE_OTHER): Admitting: Family Medicine

## 2019-04-18 ENCOUNTER — Encounter: Payer: Self-pay | Admitting: Family Medicine

## 2019-04-18 DIAGNOSIS — W57XXXA Bitten or stung by nonvenomous insect and other nonvenomous arthropods, initial encounter: Secondary | ICD-10-CM

## 2019-04-18 DIAGNOSIS — T732XXA Exhaustion due to exposure, initial encounter: Secondary | ICD-10-CM

## 2019-04-18 MED ORDER — DOXYCYCLINE HYCLATE 100 MG PO CAPS: 100.0000 mg | ORAL_CAPSULE | Freq: Two times a day (BID) | ORAL | 0 refills | Status: DC

## 2019-04-18 NOTE — Progress Notes (Addendum)
No chief complaint on file.   HPI  Patient presents today for onset 3 days ago of fatigue. Severe for a day. Denies fever. Slight HA. Took Tylenol. Two  days ago developed rash on interior right leg. Almost like a bruise. Dull right hand pain onset 2 days ago wih fingers going numbintermittently.   PMH: Smoking status noted ROS: Per HPI  Objective: LMP 07/01/1996  Gen: NAD, alert, cooperative with exam Skin: via video the patient demonstrated a hematomatous appearing lesion at the right thigh. Approx. 5 cm ovoid. No erythema. No other lesion Exam deferred otherwise. Pt. [place] due to COVID 19. Phone visit performed.  Assessment and plan:  1. Fatigue due to exposure, initial encounter   2. Tick bite, initial encounter     Meds ordered this encounter  Medications  . doxycycline (VIBRAMYCIN) 100 MG capsule    Sig: Take 1 capsule (100 mg total) by mouth 2 (two) times daily.    Dispense:  20 capsule    Refill:  0    Orders Placed This Encounter  Procedures  . CBC with Differential/Platelet  . Lyme Ab/Western Blot Reflex  . Rocky mtn spotted fvr abs pnl(IgG+IgM)    Follow up as needed. Virtual Visit via telephone Note  I discussed the limitations, risks, security and privacy concerns of performing an evaluation and management service by telephone and the availability of in person appointments. I also discussed with the patient that there may be a patient responsible charge related to this service. The patient expressed understanding and agreed to proceed. Pt. Is at home. Dr. Livia Snellen is in his office.  Follow Up Instructions:   I discussed the assessment and treatment plan with the patient. The patient was provided an opportunity to ask questions and all were answered. The patient agreed with the plan and demonstrated an understanding of the instructions.   The patient was advised to call back or seek an in-person evaluation if the symptoms worsen or if the condition fails to  improve as anticipated.   Total minutes including chart review and phone contact time: [place], MD

## 2019-04-20 LAB — CBC WITH DIFFERENTIAL/PLATELET
Basophils Absolute: 0 10*3/uL (ref 0.0–0.2)
Basos: 1 %
EOS (ABSOLUTE): 0.1 10*3/uL (ref 0.0–0.4)
Eos: 2 %
Hematocrit: 41.3 % (ref 34.0–46.6)
Hemoglobin: 14.1 g/dL (ref 11.1–15.9)
Immature Grans (Abs): 0 10*3/uL (ref 0.0–0.1)
Immature Granulocytes: 0 %
Lymphocytes Absolute: 1.6 10*3/uL (ref 0.7–3.1)
Lymphs: 41 %
MCH: 29.6 pg (ref 26.6–33.0)
MCHC: 34.1 g/dL (ref 31.5–35.7)
MCV: 87 fL (ref 79–97)
Monocytes Absolute: 0.3 10*3/uL (ref 0.1–0.9)
Monocytes: 8 %
Neutrophils Absolute: 1.9 10*3/uL (ref 1.4–7.0)
Neutrophils: 48 %
Platelets: 234 10*3/uL (ref 150–450)
RBC: 4.76 x10E6/uL (ref 3.77–5.28)
RDW: 13.1 % (ref 11.7–15.4)
WBC: 3.9 10*3/uL (ref 3.4–10.8)

## 2019-04-20 LAB — ROCKY MTN SPOTTED FVR ABS PNL(IGG+IGM)
RMSF IgG: NEGATIVE
RMSF IgM: 0.25 index (ref 0.00–0.89)

## 2019-04-20 LAB — LYME AB/WESTERN BLOT REFLEX
LYME DISEASE AB, QUANT, IGM: <0.8 index (ref 0.00–0.79)
Lyme IgG/IgM Ab: <0.91 {ISR} (ref 0.00–0.90)

## 2019-04-21 ENCOUNTER — Ambulatory Visit: Admitting: Family Medicine

## 2019-07-02 DIAGNOSIS — U071 COVID-19: Secondary | ICD-10-CM

## 2019-07-02 HISTORY — DX: COVID-19: U07.1

## 2019-07-04 DIAGNOSIS — B342 Coronavirus infection, unspecified: Secondary | ICD-10-CM | POA: Insufficient documentation

## 2019-07-06 ENCOUNTER — Encounter: Payer: Self-pay | Admitting: Family Medicine

## 2019-07-06 ENCOUNTER — Other Ambulatory Visit: Payer: Self-pay

## 2019-07-06 ENCOUNTER — Ambulatory Visit (INDEPENDENT_AMBULATORY_CARE_PROVIDER_SITE_OTHER): Admitting: Family Medicine

## 2019-07-06 DIAGNOSIS — J069 Acute upper respiratory infection, unspecified: Secondary | ICD-10-CM | POA: Diagnosis not present

## 2019-07-06 DIAGNOSIS — Z20822 Contact with and (suspected) exposure to covid-19: Secondary | ICD-10-CM

## 2019-07-06 DIAGNOSIS — Z20828 Contact with and (suspected) exposure to other viral communicable diseases: Secondary | ICD-10-CM | POA: Diagnosis not present

## 2019-07-06 NOTE — Progress Notes (Signed)
Virtual Visit via telephone Note Due to COVID-19 pandemic this visit was conducted virtually. This visit type was conducted due to national recommendations for restrictions regarding the COVID-19 Pandemic (e.g. social distancing, sheltering in place) in an effort to limit this patient's exposure and mitigate transmission in our community. All issues noted in this document were discussed and addressed.  A physical exam was not performed with this format.   I connected with Annette Silva on 07/06/2019 at 0830 by telephone and verified that I am speaking with the correct person using two identifiers. Annette Silva is currently located at home and no one is currently with them during visit. The provider, Monia Pouch, FNP is located in their office at time of visit.  I discussed the limitations, risks, security and privacy concerns of performing an evaluation and management service by telephone and the availability of in person appointments. I also discussed with the patient that there may be a patient responsible charge related to this service. The patient expressed understanding and agreed to proceed.  Subjective:  Patient ID: Annette Silva, female    DOB: 1961/07/01, 58 y.o.   MRN: [id number]  Chief Complaint:  URI   HPI: Annette Silva is a 58 y.o. female presenting on 07/06/2019 for URI   Pt reports URI symptoms that started on Tuesday. Pt denies known exposure to anyone sick or recent travel.   URI  This is a new problem. The current episode started in the past 7 days. The problem has been waxing and waning. Associated symptoms include congestion, coughing, ear pain, headaches, a plugged ear sensation, rhinorrhea, sinus pain and a sore throat. Pertinent negatives include no abdominal pain, chest pain, diarrhea, dysuria, joint pain, joint swelling, nausea, neck pain, rash, sneezing, swollen glands, vomiting or wheezing. She has tried acetaminophen, antihistamine and increased fluids for the  symptoms. The treatment provided mild relief.     Relevant past medical, surgical, family, and social history reviewed and updated as indicated.  Allergies and medications reviewed and updated.   Past Medical History:  Diagnosis Date   Allergy    Anxiety    Depression    Mini stroke ([place]) 5/ 2015   Salivary stones    Thyroid disease     Past Surgical History:  Procedure Laterality Date   ABDOMINAL HYSTERECTOMY  11 / 1997   partial    CESAREAN SECTION  1993   TOOTH EXTRACTION Left     Social History   Socioeconomic History   Marital status: Married    Spouse name: Not on file   Number of children: Not on file   Years of education: Not on file   Highest education level: Not on file  Occupational History   Not on file  Social Needs   Financial resource strain: Not on file   Food insecurity    Worry: Not on file    Inability: Not on file   Transportation needs    Medical: Not on file    Non-medical: Not on file  Tobacco Use   Smoking status: Never Smoker   Smokeless tobacco: Never Used  Substance and Sexual Activity   Alcohol use: Yes   Drug use: No   Sexual activity: Never  Lifestyle   Physical activity    Days per week: Not on file    Minutes per session: Not on file   Stress: Not on file  Relationships   Social connections    Talks on phone: Not on file  Gets together: Not on file    Attends religious service: Not on file    Active member of club or organization: Not on file    Attends meetings of clubs or organizations: Not on file    Relationship status: Not on file   Intimate partner violence    Fear of current or ex partner: Not on file    Emotionally abused: Not on file    Physically abused: Not on file    Forced sexual activity: Not on file  Other Topics Concern   Not on file  Social History Narrative   Not on file    Outpatient Encounter Medications as of 07/06/2019  Medication Sig   Calcium Carbonate-Vit  D-Min (CALCIUM 1200 PO) Take by mouth.   Cholecalciferol (D3 ADULT PO) Take 800 mg by mouth daily.   doxycycline (VIBRAMYCIN) 100 MG capsule Take 1 capsule (100 mg total) by mouth 2 (two) times daily.   Ibuprofen (ADVIL PO) Take by mouth as needed.   levothyroxine (SYNTHROID, LEVOTHROID) 50 MCG tablet Take 1 tablet (50 mcg total) by mouth daily before breakfast.   Multiple Vitamins-Minerals (CENTRUM SILVER ADULT 50+ PO) Take 1 tablet by mouth daily.   Omega-3 Fatty Acids (FISH OIL) 1200 MG CAPS Take 2 capsules by mouth daily.   vitamin B-12 (CYANOCOBALAMIN) 1000 MCG tablet Take 1,000 mcg by mouth daily.   No facility-administered encounter medications on file as of 07/06/2019.     Allergies  Allergen Reactions   Benadryl [Diphenhydramine Hcl (Sleep)]     HYPER    Review of Systems  Constitutional: Positive for activity change, chills and fatigue. Negative for appetite change, diaphoresis, fever and unexpected weight change.  HENT: Positive for congestion, ear pain, rhinorrhea, sinus pressure, sinus pain, sore throat and voice change. Negative for postnasal drip, sneezing, tinnitus and trouble swallowing.   Eyes: Negative.  Negative for photophobia and visual disturbance.  Respiratory: Positive for cough. Negative for chest tightness, shortness of breath and wheezing.   Cardiovascular: Negative for chest pain, palpitations and leg swelling.  Gastrointestinal: Negative for abdominal pain, blood in stool, constipation, diarrhea, nausea and vomiting.  Endocrine: Negative.   Genitourinary: Negative for decreased urine volume, difficulty urinating, dysuria, frequency and urgency.  Musculoskeletal: Negative for arthralgias, joint pain, myalgias and neck pain.  Skin: Negative.  Negative for rash.  Allergic/Immunologic: Negative.   Neurological: Positive for headaches. Negative for dizziness, tremors, seizures, syncope, facial asymmetry, speech difficulty, weakness, light-headedness and  numbness.  Hematological: Negative.   Psychiatric/Behavioral: Negative for confusion, hallucinations, sleep disturbance and suicidal ideas.  All other systems reviewed and are negative.        Observations/Objective: No vital signs or physical exam, this was a telephone or virtual health encounter.  Pt alert and oriented, answers all questions appropriately, and able to speak in full sentences.    Assessment and Plan: Annette Silva was seen today for uri.  Diagnoses and all orders for this visit:  Viral URI Suspected 2019 novel coronavirus infection Reported symptoms consistent with viral URI and concerning for COVID-19. Infection prevention and self quarantine measures discussed with pt. Symptomatic care discussed with pt. Pt aware to report any new or worsening symptoms.  This is a viral infection that cannot be treated with antibiotics. (Antibiotics are for bacterial infections, not viral infections.)  Viruses usually causes high fever during the first 3 days, followed by a gradual decrease in fever over the next 3-4 days. This infection will resolve through the body's defenses. Understand that  fever is one of the body's primary defense mechanisms; an increased core body temperature (a fever) helps to kill germs. Symptomatic care at home should include the following:    Get plenty of rest.   Drink plenty of fluids (water, gatorade, or pedialyte), popsicles, and ice cream.    Take acetaminophen (Tylenol) or ibuprofen (Advil, Motrin) for fever or pain as needed.    For fever, you can also put a cool rag on the head and drink cold drinks.  A warm (not hot) bath will also be helpful.    Take honey for sore throat or to soothe an irritant cough.   Avoid spicy or acidic foods to minimize further throat irritation. For the same reason, avoid juices with citric acid.   Creamy drinks also help soothe a sore throat.   Contact and droplet isolation for 5 days. Wash hands very well.  Wipe down  all surfaces with sanitizer wipes at least once a day. Do not share cups or utensils.    Expect resolution in about 10 days.  If her fever increases after the 3rd day, instead of decreases, please return to the office.  If her voice starts to change and becomes very nasal sounding, then please return to the office.       Follow Up Instructions: Return if symptoms worsen or fail to improve.    I discussed the assessment and treatment plan with the patient. The patient was provided an opportunity to ask questions and all were answered. The patient agreed with the plan and demonstrated an understanding of the instructions.   The patient was advised to call back or seek an in-person evaluation if the symptoms worsen or if the condition fails to improve as anticipated.  The above assessment and management plan was discussed with the patient. The patient verbalized understanding of and has agreed to the management plan. Patient is aware to call the clinic if they develop any new symptoms or if symptoms persist or worsen. Patient is aware when to return to the clinic for a follow-up visit. Patient educated on when it is appropriate to go to the emergency department.    I provided 15 minutes of non-face-to-face time during this encounter. The call started at 0830. The call ended at [place]. The other time was used for coordination of care.    Monia Pouch, FNP-C [facility] [address] [place], [place] [address] [phone] 07/06/2019

## 2019-07-14 ENCOUNTER — Encounter: Payer: Self-pay | Admitting: *Deleted

## 2019-08-22 ENCOUNTER — Encounter: Payer: Self-pay | Admitting: Family Medicine

## 2019-08-22 ENCOUNTER — Ambulatory Visit (INDEPENDENT_AMBULATORY_CARE_PROVIDER_SITE_OTHER): Admitting: Family Medicine

## 2019-08-22 DIAGNOSIS — M25531 Pain in right wrist: Secondary | ICD-10-CM

## 2019-08-22 MED ORDER — PREDNISONE 10 MG PO TABS: ORAL_TABLET | ORAL | 0 refills | Status: DC

## 2019-08-22 NOTE — Progress Notes (Signed)
Subjective:    Patient ID: Annette Silva, female    DOB: 1961-02-24, 58 y.o.   MRN: [id number]   HPI: Annette Silva is a 58 y.o. female presenting for wrist pain for 2 weeks. Chiropractor was treating it with improvement until Nov. 3. Then she got CoVID and she had to delay treatment. Wrist much worse. Went back to chiropractor who now says he has done all he can. Pain is constant. At rest it is 4/10. With writing, prepare food (holding a knife)  increases the pain. It is in the wrist and radiates up the arm. Has a knot  On the side of the wrist.    Depression screen [facility] 2/9 04/11/2019 10/14/2018 04/06/2018 09/24/2017 07/21/2017  Decreased Interest 0 0 0 0 0  Down, Depressed, Hopeless 1 0 0 0 0  PHQ - 2 Score 1 0 0 0 0  Altered sleeping - - - - -  Tired, decreased energy - - - - -  Change in appetite - - - - -  Feeling bad or failure about yourself  - - - - -  Trouble concentrating - - - - -  Moving slowly or fidgety/restless - - - - -  Suicidal thoughts - - - - -  PHQ-9 Score - - - - -  Difficult doing work/chores - - - - -     Relevant past medical, surgical, family and social history reviewed and updated as indicated.  Interim medical history since our last visit reviewed. Allergies and medications reviewed and updated.  ROS:  Review of Systems    noncontributory Social History   Tobacco Use  Smoking Status Never Smoker  Smokeless Tobacco Never Used       Objective:     Wt Readings from Last 3 Encounters:  04/11/19 142 lb (64.4 kg)  10/14/18 141 lb 2 oz (64 kg)  10/01/18 140 lb 6 oz (63.7 kg)     Exam deferred. Pt. Harboring due to COVID 19. Phone visit performed.   Assessment & Plan:   1. Right wrist pain     Meds ordered this encounter  Medications  . predniSONE (DELTASONE) 10 MG tablet    Sig: Take 5 daily for 2 days followed by 4,3,2 and 1 for 2 days each.    Dispense:  30 tablet    Refill:  0    Orders Placed This Encounter  Procedures  . Ortho      Referral Priority:   Routine    Referral Type:   Consultation    Number of Visits Requested:   1      Diagnoses and all orders for this visit:  Right wrist pain -     Ortho  Other orders -     predniSONE (DELTASONE) 10 MG tablet; Take 5 daily for 2 days followed by 4,3,2 and 1 for 2 days each.  Pt. To pick up wrist brace to wear at triage later today.   Virtual Visit via telephone Note  I discussed the limitations, risks, security and privacy concerns of performing an evaluation and management service by telephone and the availability of in person appointments. The patient was identified with two identifiers. Pt.expressed understanding and agreed to proceed. Pt. Is at home. Dr. Livia Snellen is in his office.  Follow Up Instructions:   I discussed the assessment and treatment plan with the patient. The patient was provided an opportunity to ask questions and all were answered. The patient agreed with the plan  and demonstrated an understanding of the instructions.   The patient was advised to call back or seek an in-person evaluation if the symptoms worsen or if the condition fails to improve as anticipated.   Total minutes including chart review and phone contact time: 21   Follow up plan: Return if symptoms worsen or fail to improve.  Claretta Fraise, MD [place]

## 2019-10-16 ENCOUNTER — Encounter: Admitting: Family Medicine

## 2019-10-18 ENCOUNTER — Encounter: Admitting: Family Medicine

## 2019-10-19 ENCOUNTER — Encounter: Admitting: Family Medicine

## 2019-10-25 ENCOUNTER — Ambulatory Visit (INDEPENDENT_AMBULATORY_CARE_PROVIDER_SITE_OTHER): Admitting: Family Medicine

## 2019-10-25 ENCOUNTER — Encounter: Payer: Self-pay | Admitting: Family Medicine

## 2019-10-25 ENCOUNTER — Other Ambulatory Visit: Payer: Self-pay

## 2019-10-25 VITALS — BP 133/81 | HR 78 | Temp 99.1°F | Ht 65.75 in | Wt 151.8 lb

## 2019-10-25 DIAGNOSIS — Z13220 Encounter for screening for lipoid disorders: Secondary | ICD-10-CM

## 2019-10-25 DIAGNOSIS — Z0000 Encounter for general adult medical examination without abnormal findings: Secondary | ICD-10-CM

## 2019-10-25 DIAGNOSIS — T732XXA Exhaustion due to exposure, initial encounter: Secondary | ICD-10-CM

## 2019-10-25 DIAGNOSIS — E039 Hypothyroidism, unspecified: Secondary | ICD-10-CM

## 2019-10-25 DIAGNOSIS — R430 Anosmia: Secondary | ICD-10-CM

## 2019-10-25 DIAGNOSIS — E559 Vitamin D deficiency, unspecified: Secondary | ICD-10-CM

## 2019-10-25 DIAGNOSIS — B342 Coronavirus infection, unspecified: Secondary | ICD-10-CM

## 2019-10-25 MED ORDER — PREDNISONE 10 MG PO TABS: ORAL_TABLET | ORAL | 0 refills | Status: DC

## 2019-10-25 NOTE — Progress Notes (Signed)
Subjective:  Patient ID: Annette Silva, female    DOB: Dec 24, 1960  Age: 59 y.o. MRN: [id number]  CC: Annual Exam   HPI Kloey Cazarez presents for annual physical and  follow-up on  thyroid. The patient has a history of hypothyroidism for many years. It has been stable recently. Pt. denies any change in  voice, loss of hair, heat or cold intolerance. Energy level has been adequate to good. Patient denies constipation and diarrhea. No myxedema. Medication is as noted below. Verified that pt is taking it daily on an empty stomach. Well tolerated.  Patient continues to have ill effects of the Covid infection she had starting back on November 3.  She continues to have fatigue.  She has no sense of smell.  She can taste sweet and salt.  She has a deep cough intermittently although it is nonproductive.  She can get dyspnea with exertion when she walks a mile or more.  She has a runny nose that is clear.  She feels like her mind is in a fog frequently.  She gets odd bruises scattered over her body just a few at a time and they do heal.   Depression screen [facility] 2/9 10/25/2019 04/11/2019 10/14/2018  Decreased Interest 0 0 0  Down, Depressed, Hopeless 0 1 0  PHQ - 2 Score 0 1 0  Altered sleeping - - -  Tired, decreased energy - - -  Change in appetite - - -  Feeling bad or failure about yourself  - - -  Trouble concentrating - - -  Moving slowly or fidgety/restless - - -  Suicidal thoughts - - -  PHQ-9 Score - - -  Difficult doing work/chores - - -    History Galen has a past medical history of Allergy, Anxiety, Depression, Mini stroke ([place]) (5/ 2015), Salivary stones, and Thyroid disease.   She has a past surgical history that includes Abdominal hysterectomy (11 / 1997); Cesarean section (1993); and Tooth extraction (Left).   Her family history includes Arthritis in her mother; COPD in her mother; Cancer in her father; Depression in her mother; Diabetes in her mother; Hearing loss in her mother;  Heart disease in her mother; Hypertension in her mother; Stroke in her mother.She reports that she has never smoked. She has never used smokeless tobacco. She reports current alcohol use. She reports that she does not use drugs.    ROS Review of Systems  Constitutional: Positive for fatigue. Negative for chills, diaphoresis and fever.  HENT: Positive for rhinorrhea. Negative for congestion, ear pain, hearing loss, nosebleeds, sore throat and tinnitus.   Eyes: Negative for photophobia, pain, discharge and redness.  Respiratory: Positive for cough and shortness of breath. Negative for wheezing.   Cardiovascular: Negative for chest pain, palpitations and leg swelling.  Gastrointestinal: Negative for abdominal pain, blood in stool, constipation, diarrhea, nausea and vomiting.  Endocrine: Negative for polydipsia.  Genitourinary: Negative for dysuria, flank pain, frequency, hematuria and urgency.  Musculoskeletal: Positive for arthralgias. Negative for back pain, myalgias and neck pain.  Skin: Negative for rash.  Allergic/Immunologic: Negative for environmental allergies.  Neurological: Positive for weakness (nonfocal). Negative for dizziness, tremors, seizures and headaches.  Hematological: Does not bruise/bleed easily.  Psychiatric/Behavioral: Negative for hallucinations and suicidal ideas. The patient is not nervous/anxious.     Objective:  BP 133/81   Pulse 78   Temp 99.1 F (37.3 C) (Temporal)   Ht 5' 5.75" (1.67 m)   Wt 151 lb 12.8 oz (68.9  kg)   LMP 07/01/1996   BMI 24.69 kg/m   BP Readings from Last 3 Encounters:  10/25/19 133/81  04/11/19 99/70  10/14/18 111/68    Wt Readings from Last 3 Encounters:  10/25/19 151 lb 12.8 oz (68.9 kg)  04/11/19 142 lb (64.4 kg)  10/14/18 141 lb 2 oz (64 kg)     Physical Exam Constitutional:      General: She is not in acute distress.    Appearance: Normal appearance. She is well-developed.  HENT:     Head: Normocephalic and  atraumatic.     Right Ear: External ear normal.     Left Ear: External ear normal.     Nose: Nose normal.  Eyes:     Conjunctiva/sclera: Conjunctivae normal.     Pupils: Pupils are equal, round, and reactive to light.  Neck:     Thyroid: No thyromegaly.  Cardiovascular:     Rate and Rhythm: Normal rate and regular rhythm.     Heart sounds: Normal heart sounds. No murmur.  Pulmonary:     Effort: Pulmonary effort is normal. No respiratory distress.     Breath sounds: Normal breath sounds. No wheezing or rales.  Chest:     Breasts: Breasts are symmetrical.        Right: No inverted nipple, mass or tenderness.        Left: No inverted nipple, mass or tenderness.  Abdominal:     General: Bowel sounds are normal. There is no distension or abdominal bruit.     Palpations: Abdomen is soft. There is no hepatomegaly, splenomegaly or mass.     Tenderness: There is no abdominal tenderness. Negative signs include Murphy's sign and McBurney's sign.  Musculoskeletal:        General: No tenderness. Normal range of motion.     Cervical back: Normal range of motion and neck supple.  Lymphadenopathy:     Cervical: No cervical adenopathy.  Skin:    General: Skin is warm and dry.     Findings: No rash.  Neurological:     Mental Status: She is alert and oriented to person, place, and time.     Deep Tendon Reflexes: Reflexes are normal and symmetric.  Psychiatric:        Behavior: Behavior normal.        Thought Content: Thought content normal.        Judgment: Judgment normal.       Assessment & Plan:   Delpha was seen today for annual exam.  Diagnoses and all orders for this visit:  Well adult exam -     CBC with Differential/Platelet -     CMP14+EGFR -     Lipid panel  Acquired hypothyroidism -     CBC with Differential/Platelet -     CMP14+EGFR -     TSH + free T4  Coronavirus infection -     CBC with Differential/Platelet -     CMP14+EGFR -     predniSONE (DELTASONE) 10 MG  tablet; Take 5 daily for 3 days followed by 4,3,2 and 1 for 3 days each.  Fatigue due to exposure, initial encounter -     CBC with Differential/Platelet -     CMP14+EGFR -     Vitamin S06 -     CYCLIC CITRUL PEPTIDE ANTIBODY, IGG/IGA -     Sedimentation rate -     C-reactive protein -     predniSONE (DELTASONE) 10 MG tablet; Take 5 daily  for 3 days followed by 4,3,2 and 1 for 3 days each.  Screening for cholesterol level -     Lipid panel  Vitamin D deficiency -     VITAMIN D 25 Hydroxy (Vit-D Deficiency, Fractures)  Anosmia -     predniSONE (DELTASONE) 10 MG tablet; Take 5 daily for 3 days followed by 4,3,2 and 1 for 3 days each.       I have discontinued Jeneva Sleight's doxycycline and predniSONE. I am also having her start on predniSONE. Additionally, I am having her maintain her Multiple Vitamins-Minerals (CENTRUM SILVER ADULT 50+ PO), Calcium Carbonate-Vit D-Min (CALCIUM 1200 PO), Cholecalciferol (D3 ADULT PO), vitamin B-12, Fish Oil, Ibuprofen (ADVIL PO), and levothyroxine.  Allergies as of 10/25/2019      Reactions   Benadryl [diphenhydramine Hcl (sleep)]    HYPER      Medication List       Accurate as of October 25, 2019 11:19 PM. If you have any questions, ask your nurse or doctor.        STOP taking these medications   doxycycline 100 MG capsule Commonly known as: Vibramycin Stopped by: Claretta Fraise, MD     TAKE these medications   ADVIL PO Take by mouth as needed.   CALCIUM 1200 PO Take by mouth.   CENTRUM SILVER ADULT 50+ PO Take 1 tablet by mouth daily.   D3 ADULT PO Take 800 mg by mouth daily.   Fish Oil 1200 MG Caps Take 2 capsules by mouth daily.   levothyroxine 50 MCG tablet Commonly known as: SYNTHROID Take 1 tablet (50 mcg total) by mouth daily before breakfast.   predniSONE 10 MG tablet Commonly known as: DELTASONE Take 5 daily for 3 days followed by 4,3,2 and 1 for 3 days each. What changed: additional instructions Changed  by: Claretta Fraise, MD   vitamin B-12 1000 MCG tablet Commonly known as: CYANOCOBALAMIN Take 1,000 mcg by mouth daily.      With regard to overall health the patient seems to be in good condition today and stable.  However her complaints about Covid are of concern.  I have ordered blood work particularly thyroid and connective tissue.  These may be underlying causes of her symptoms.  She is also to take a taper of prednisone.  If the symptoms do not resolve with the prednisone and the labs are not revealing of a cause that can be treated here, I will be asking infectious disease to evaluate her.  Follow-up: No follow-ups on file.  Claretta Fraise, M.D.

## 2019-10-26 ENCOUNTER — Other Ambulatory Visit: Payer: Self-pay | Admitting: Family Medicine

## 2019-10-26 DIAGNOSIS — E039 Hypothyroidism, unspecified: Secondary | ICD-10-CM

## 2019-10-26 MED ORDER — LEVOTHYROXINE SODIUM 75 MCG PO TABS: 75.0000 ug | ORAL_TABLET | Freq: Every day | ORAL | 1 refills | Status: DC

## 2019-10-27 LAB — CMP14+EGFR
ALT: 26 IU/L (ref 0–32)
AST: 28 IU/L (ref 0–40)
Albumin/Globulin Ratio: 2.3 — ABNORMAL HIGH (ref 1.2–2.2)
Albumin: 4.5 g/dL (ref 3.8–4.9)
Alkaline Phosphatase: 78 IU/L (ref 39–117)
BUN/Creatinine Ratio: 12 (ref 12–28)
BUN: 11 mg/dL (ref 8–27)
Bilirubin Total: 0.3 mg/dL (ref 0.0–1.2)
CO2: 26 mmol/L (ref 20–29)
Calcium: 9.5 mg/dL (ref 8.7–10.3)
Chloride: 102 mmol/L (ref 96–106)
Creatinine, Ser: 0.94 mg/dL (ref 0.57–1.00)
GFR calc Af Amer: 76 mL/min/{1.73_m2} (ref >59)
GFR calc non Af Amer: 66 mL/min/{1.73_m2} (ref >59)
Globulin, Total: 2 g/dL (ref 1.5–4.5)
Glucose: 80 mg/dL (ref 65–99)
Potassium: 4.1 mmol/L (ref 3.5–5.2)
Sodium: 143 mmol/L (ref 134–144)
Total Protein: 6.5 g/dL (ref 6.0–8.5)

## 2019-10-27 LAB — CBC WITH DIFFERENTIAL/PLATELET
Basophils Absolute: 0 10*3/uL (ref 0.0–0.2)
Basos: 1 %
EOS (ABSOLUTE): 0.1 10*3/uL (ref 0.0–0.4)
Eos: 2 %
Hematocrit: 41.4 % (ref 34.0–46.6)
Hemoglobin: 13.8 g/dL (ref 11.1–15.9)
Immature Grans (Abs): 0 10*3/uL (ref 0.0–0.1)
Immature Granulocytes: 0 %
Lymphocytes Absolute: 1.7 10*3/uL (ref 0.7–3.1)
Lymphs: 47 %
MCH: 29.2 pg (ref 26.6–33.0)
MCHC: 33.3 g/dL (ref 31.5–35.7)
MCV: 88 fL (ref 79–97)
Monocytes Absolute: 0.3 10*3/uL (ref 0.1–0.9)
Monocytes: 8 %
Neutrophils Absolute: 1.5 10*3/uL (ref 1.4–7.0)
Neutrophils: 42 %
Platelets: 220 10*3/uL (ref 150–450)
RBC: 4.72 x10E6/uL (ref 3.77–5.28)
RDW: 12.4 % (ref 11.7–15.4)
WBC: 3.7 10*3/uL (ref 3.4–10.8)

## 2019-10-27 LAB — LIPID PANEL
Chol/HDL Ratio: 2.6 ratio (ref 0.0–4.4)
Cholesterol, Total: 220 mg/dL — ABNORMAL HIGH (ref 100–199)
HDL: 84 mg/dL (ref >39)
LDL Chol Calc (NIH): 122 mg/dL — ABNORMAL HIGH (ref 0–99)
Triglycerides: 78 mg/dL (ref 0–149)
VLDL Cholesterol Cal: 14 mg/dL (ref 5–40)

## 2019-10-27 LAB — SEDIMENTATION RATE: Sed Rate: 2 mm/hr (ref 0–40)

## 2019-10-27 LAB — TSH+FREE T4
Free T4: 1.14 ng/dL (ref 0.82–1.77)
TSH: 5.16 u[IU]/mL — ABNORMAL HIGH (ref 0.450–4.500)

## 2019-10-27 LAB — VITAMIN B12: Vitamin B-12: >2000 pg/mL — ABNORMAL HIGH (ref 232–1245)

## 2019-10-27 LAB — CYCLIC CITRUL PEPTIDE ANTIBODY, IGG/IGA: Cyclic Citrullin Peptide Ab: 5 units (ref 0–19)

## 2019-10-27 LAB — VITAMIN D 25 HYDROXY (VIT D DEFICIENCY, FRACTURES): Vit D, 25-Hydroxy: 71.3 ng/mL (ref 30.0–100.0)

## 2019-10-27 LAB — C-REACTIVE PROTEIN: CRP: <1 mg/L (ref 0–10)

## 2019-11-15 ENCOUNTER — Other Ambulatory Visit: Payer: Self-pay

## 2019-11-15 ENCOUNTER — Encounter: Payer: Self-pay | Admitting: Family Medicine

## 2019-11-15 ENCOUNTER — Ambulatory Visit (INDEPENDENT_AMBULATORY_CARE_PROVIDER_SITE_OTHER): Admitting: Family Medicine

## 2019-11-15 VITALS — BP 109/50 | HR 75 | Temp 98.0°F | Ht 65.75 in | Wt 150.0 lb

## 2019-11-15 DIAGNOSIS — J4541 Moderate persistent asthma with (acute) exacerbation: Secondary | ICD-10-CM

## 2019-11-15 DIAGNOSIS — E039 Hypothyroidism, unspecified: Secondary | ICD-10-CM | POA: Diagnosis not present

## 2019-11-15 MED ORDER — FLUTICASONE-SALMETEROL 250-50 MCG/DOSE IN AEPB: 1.0000 | INHALATION_SPRAY | Freq: Two times a day (BID) | RESPIRATORY_TRACT | 2 refills | Status: DC

## 2019-11-15 NOTE — Progress Notes (Signed)
Subjective:  Patient ID: Annette Silva, female    DOB: [birth year]  Age: 59 y.o. MRN: [id number]  CC: follow up on COVID  (3 week follow up- Patient states that the prednisone did not help her any.)   HPI Annette Silva presents for continued energy deficiency ever since she was treated for Covid in November 2020.  She continues to have a cough.  She has a heavy weight in the center of her chest most of the time.  She is able to function only through the day and then she will have a sudden loss of energy for the rest of the day.  This is impacted her sleep as well.  She is sleeping about 5 to 6 hours at most.  She can get to sleep as usual but she wakes early is 4 AM usually after 5 hours or so sleep.  Patient is exhausted frustrated and cannot understand why her symptoms continue.  She got no relief with the prednisone.  Depression screen [facility] 2/9 11/15/2019 10/25/2019 04/11/2019  Decreased Interest 0 0 0  Down, Depressed, Hopeless 1 0 1  PHQ - 2 Score 1 0 1  Altered sleeping - - -  Tired, decreased energy - - -  Change in appetite - - -  Feeling bad or failure about yourself  - - -  Trouble concentrating - - -  Moving slowly or fidgety/restless - - -  Suicidal thoughts - - -  PHQ-9 Score - - -  Difficult doing work/chores - - -    History Annette Silva has a past medical history of Allergy, Anxiety, Depression, Mini stroke ([place]) (5/ 2015), Salivary stones, and Thyroid disease.   She has a past surgical history that includes Abdominal hysterectomy (11 / 1997); Cesarean section (1993); and Tooth extraction (Left).   Her family history includes Arthritis in her mother; COPD in her mother; Cancer in her father; Depression in her mother; Diabetes in her mother; Hearing loss in her mother; Heart disease in her mother; Hypertension in her mother; Stroke in her mother.She reports that she has never smoked. She has never used smokeless tobacco. She reports current alcohol use. She reports that she does not  use drugs.    ROS Review of Systems  Constitutional: Negative.   HENT: Negative.   Eyes: Negative for visual disturbance.  Respiratory: Positive for chest tightness. Negative for shortness of breath.   Cardiovascular: Negative for chest pain.  Gastrointestinal: Negative for abdominal pain.  Musculoskeletal: Negative for arthralgias.  Psychiatric/Behavioral: Positive for sleep disturbance (can get to sleep but is wakening early after about 5 hours).    Objective:  BP (!) 109/50   Pulse 75   Temp 98 F (36.7 C) (Temporal)   Ht 5' 5.75" (1.67 m)   Wt 150 lb (68 kg)   LMP 07/01/1996   SpO2 100%   BMI 24.40 kg/m   BP Readings from Last 3 Encounters:  11/15/19 (!) 109/50  10/25/19 133/81  04/11/19 99/70    Wt Readings from Last 3 Encounters:  11/15/19 150 lb (68 kg)  10/25/19 151 lb 12.8 oz (68.9 kg)  04/11/19 142 lb (64.4 kg)     Physical Exam Constitutional:      General: She is not in acute distress.    Appearance: She is well-developed.  HENT:     Head: Normocephalic and atraumatic.  Eyes:     Conjunctiva/sclera: Conjunctivae normal.     Pupils: Pupils are equal, round, and reactive to light.  Neck:     Thyroid: No thyromegaly.  Cardiovascular:     Rate and Rhythm: Normal rate and regular rhythm.     Heart sounds: Normal heart sounds. No murmur.  Pulmonary:     Effort: Pulmonary effort is normal. No respiratory distress.     Breath sounds: Normal breath sounds. No wheezing or rales.  Abdominal:     General: Bowel sounds are normal. There is no distension.     Palpations: Abdomen is soft.     Tenderness: There is no abdominal tenderness.  Musculoskeletal:        General: Normal range of motion.     Cervical back: Normal range of motion and neck supple.  Lymphadenopathy:     Cervical: No cervical adenopathy.  Skin:    General: Skin is warm and dry.  Neurological:     Mental Status: She is alert and oriented to person, place, and time.  Psychiatric:         Behavior: Behavior normal.        Thought Content: Thought content normal.        Judgment: Judgment normal.       Assessment & Plan:   Annette Silva was seen today for follow up on covid .  Diagnoses and all orders for this visit:  Moderate persistent reactive airway disease with acute exacerbation -     Fluticasone-Salmeterol (ADVAIR DISKUS) 250-50 MCG/DOSE AEPB; Inhale 1 puff into the lungs in the morning and at bedtime.  Acquired hypothyroidism   Since the patient's fatigue is ongoing her thyroid medicine was adjusted upward.  This was only 3 weeks ago.  Had like to see her on it for at least 6 weeks before we have any further work-up.  However due to the cough and heaviness in her chest went ahead with an inhaler.    I have discontinued Annette Silva's predniSONE. I am also having her start on Fluticasone-Salmeterol. Additionally, I am having her maintain her Multiple Vitamins-Minerals (CENTRUM SILVER ADULT 50+ PO), Calcium Carbonate-Vit D-Min (CALCIUM 1200 PO), Cholecalciferol (D3 ADULT PO), vitamin B-12, Fish Oil, Ibuprofen (ADVIL PO), and levothyroxine.  Allergies as of 11/15/2019      Reactions   Benadryl [diphenhydramine Hcl (sleep)]    HYPER      Medication List       Accurate as of November 15, 2019  9:43 PM. If you have any questions, ask your nurse or doctor.        STOP taking these medications   predniSONE 10 MG tablet Commonly known as: DELTASONE Stopped by: Annette Fraise, MD     TAKE these medications   ADVIL PO Take by mouth as needed.   CALCIUM 1200 PO Take by mouth.   CENTRUM SILVER ADULT 50+ PO Take 1 tablet by mouth daily.   D3 ADULT PO Take 800 mg by mouth daily.   Fish Oil 1200 MG Caps Take 2 capsules by mouth daily.   Fluticasone-Salmeterol 250-50 MCG/DOSE Aepb Commonly known as: Advair Diskus Inhale 1 puff into the lungs in the morning and at bedtime. Started by: Annette Fraise, MD   levothyroxine 75 MCG tablet Commonly known as:  SYNTHROID Take 1 tablet (75 mcg total) by mouth daily before breakfast.   vitamin B-12 1000 MCG tablet Commonly known as: CYANOCOBALAMIN Take 1,000 mcg by mouth daily.        Follow-up: Return in about 1 month (around 12/16/2019).  Annette Silva, M.D.

## 2019-12-13 ENCOUNTER — Other Ambulatory Visit: Payer: Self-pay

## 2019-12-13 ENCOUNTER — Ambulatory Visit (INDEPENDENT_AMBULATORY_CARE_PROVIDER_SITE_OTHER): Admitting: Family Medicine

## 2019-12-13 ENCOUNTER — Encounter: Payer: Self-pay | Admitting: Family Medicine

## 2019-12-13 VITALS — BP 121/77 | HR 75 | Temp 97.4°F | Resp 20 | Ht 65.75 in | Wt 152.4 lb

## 2019-12-13 DIAGNOSIS — E039 Hypothyroidism, unspecified: Secondary | ICD-10-CM

## 2019-12-13 DIAGNOSIS — M3581 Multisystem inflammatory syndrome: Secondary | ICD-10-CM

## 2019-12-13 DIAGNOSIS — Z114 Encounter for screening for human immunodeficiency virus [HIV]: Secondary | ICD-10-CM

## 2019-12-13 MED ORDER — BENZONATATE 200 MG PO CAPS: 200.0000 mg | ORAL_CAPSULE | Freq: Three times a day (TID) | ORAL | 0 refills | Status: DC | PRN

## 2019-12-13 NOTE — Progress Notes (Signed)
Subjective:  Patient ID: Annette Silva, female    DOB: 11-02-1960  Age: 59 y.o. MRN: [id number]  CC: One month follow up reactive airway disease   HPI [name] presents for at her wits end. Excessively fatigued and feels intermittent severe dyspnea ever since CoVID occurred 5 months ago. Work performance is way off. She says she is lucky her boss is understanding. She still has a vigorous cough.   Due for recheck of thyroid since level was off recently.   Depression screen [facility] 2/9 12/13/2019 11/15/2019 10/25/2019  Decreased Interest 0 0 0  Down, Depressed, Hopeless 1 1 0  PHQ - 2 Score 1 1 0  Altered sleeping - - -  Tired, decreased energy - - -  Change in appetite - - -  Feeling bad or failure about yourself  - - -  Trouble concentrating - - -  Moving slowly or fidgety/restless - - -  Suicidal thoughts - - -  PHQ-9 Score - - -  Difficult doing work/chores - - -    History [name] has a past medical history of Allergy, Anxiety, Depression, Mini stroke ([place]) (5/ 2015), Salivary stones, and Thyroid disease.   She has a past surgical history that includes Abdominal hysterectomy (11 / 1997); Cesarean section (1993); and Tooth extraction (Left).   Her family history includes Arthritis in her mother; COPD in her mother; Cancer in her father; Depression in her mother; Diabetes in her mother; Hearing loss in her mother; Heart disease in her mother; Hypertension in her mother; Stroke in her mother.She reports that she has never smoked. She has never used smokeless tobacco. She reports current alcohol use. She reports that she does not use drugs.    ROS Review of Systems  Constitutional: Positive for fatigue. Negative for fever.  HENT: Negative for congestion.   Eyes: Negative for visual disturbance.  Respiratory: Positive for cough and shortness of breath.   Cardiovascular: Negative for chest pain.  Gastrointestinal: Negative for abdominal pain, constipation, diarrhea, nausea and  vomiting.  Genitourinary: Negative for difficulty urinating.  Musculoskeletal: Negative for arthralgias and myalgias.  Neurological: Positive for weakness. Negative for headaches.  Psychiatric/Behavioral: Positive for decreased concentration. Negative for sleep disturbance.    Objective:  BP 121/77   Pulse 75   Temp (!) 97.4 F (36.3 C) (Oral)   Resp 20   Ht 5' 5.75" (1.67 m)   Wt 152 lb 6 oz (69.1 kg)   LMP 07/01/1996   SpO2 99%   BMI 24.78 kg/m   BP Readings from Last 3 Encounters:  12/13/19 121/77  11/15/19 (!) 109/50  10/25/19 133/81    Wt Readings from Last 3 Encounters:  12/13/19 152 lb 6 oz (69.1 kg)  11/15/19 150 lb (68 kg)  10/25/19 151 lb 12.8 oz (68.9 kg)     Physical Exam Constitutional:      General: She is not in acute distress.    Appearance: She is well-developed.  HENT:     Head: Normocephalic and atraumatic.     Right Ear: External ear normal.     Left Ear: External ear normal.     Nose: Nose normal.  Eyes:     Conjunctiva/sclera: Conjunctivae normal.     Pupils: Pupils are equal, round, and reactive to light.  Neck:     Thyroid: No thyromegaly.  Cardiovascular:     Rate and Rhythm: Normal rate and regular rhythm.     Heart sounds: Normal heart sounds. No murmur.  Pulmonary:     Effort: Pulmonary effort is normal. No respiratory distress.     Breath sounds: Normal breath sounds. No wheezing or rales.  Abdominal:     General: Bowel sounds are normal. There is no distension.     Palpations: Abdomen is soft.     Tenderness: There is no abdominal tenderness.  Musculoskeletal:     Cervical back: Normal range of motion and neck supple.  Lymphadenopathy:     Cervical: No cervical adenopathy.  Skin:    General: Skin is warm and dry.  Neurological:     Mental Status: She is alert and oriented to person, place, and time.     Deep Tendon Reflexes: Reflexes are normal and symmetric.  Psychiatric:        Behavior: Behavior normal.         Thought Content: Thought content normal.        Judgment: Judgment normal.       Assessment & Plan:   Mirka was seen today for one month follow up reactive airway disease.  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       I am having Annette Silva start on benzonatate. I am also having her maintain her Multiple Vitamins-Minerals (CENTRUM SILVER ADULT 50+ PO), Calcium Carbonate-Vit D-Min (CALCIUM 1200 PO), Cholecalciferol (D3 ADULT PO), vitamin B-12, Fish Oil, Ibuprofen (ADVIL PO), levothyroxine, and Fluticasone-Salmeterol.  Allergies as of 12/13/2019      Reactions   Benadryl [diphenhydramine Hcl (sleep)]    HYPER      Medication List       Accurate as of December 13, 2019  9:58 PM. If you have any questions, ask your nurse or doctor.        ADVIL PO Take by mouth as needed.   benzonatate 200 MG capsule Commonly known as: TESSALON Take 1 capsule (200 mg total) by mouth 3 (three) times daily as needed for cough. Started by: Claretta Fraise, MD   CALCIUM 1200 PO Take by mouth.   CENTRUM SILVER ADULT 50+ PO Take 1 tablet by mouth daily.   D3 ADULT PO Take 800 mg by mouth daily.   Fish Oil 1200 MG Caps Take 2 capsules by mouth daily.   Fluticasone-Salmeterol 250-50 MCG/DOSE Aepb Commonly known as: Advair Diskus Inhale 1 puff into the lungs in the morning and at bedtime.   levothyroxine 75 MCG tablet Commonly known as: SYNTHROID Take 1 tablet (75 mcg total) by mouth daily before breakfast.   vitamin B-12 1000 MCG tablet Commonly known as: CYANOCOBALAMIN Take 1,000 mcg by mouth daily.      Pt. Referred urgently to the  post CoVID clinic.   Follow-up: Return if symptoms worsen or fail to improve.  Claretta Fraise, M.D.

## 2019-12-13 NOTE — Patient Instructions (Addendum)
We are working on referral to post - CoVID clinic for you. Contact [phone] [place], [place]

## 2019-12-14 LAB — TSH+FREE T4
Free T4: 1.46 ng/dL (ref 0.82–1.77)
TSH: 2.89 u[IU]/mL (ref 0.450–4.500)

## 2019-12-14 LAB — HIV ANTIBODY (ROUTINE TESTING W REFLEX): HIV Screen 4th Generation wRfx: NONREACTIVE

## 2019-12-14 NOTE — Progress Notes (Signed)
Hello Jess,  Your lab result is normal and/or stable.Some minor variations that are not significant are commonly marked abnormal, but do not represent any medical problem for you.  Best regards, Laruth Hanger, M.D.

## 2019-12-15 ENCOUNTER — Other Ambulatory Visit: Payer: Self-pay

## 2019-12-15 ENCOUNTER — Ambulatory Visit (INDEPENDENT_AMBULATORY_CARE_PROVIDER_SITE_OTHER): Admitting: Nurse Practitioner

## 2019-12-15 ENCOUNTER — Ambulatory Visit (HOSPITAL_COMMUNITY)
Admission: RE | Admit: 2019-12-15 | Discharge: 2019-12-15 | Disposition: A | Discharge status: Home / Self Care | Source: Ambulatory Visit | Attending: Nurse Practitioner | Admitting: Nurse Practitioner

## 2019-12-15 VITALS — BP 128/88 | HR 65 | Temp 96.8°F | Ht 65.75 in | Wt 150.5 lb

## 2019-12-15 DIAGNOSIS — R413 Other amnesia: Secondary | ICD-10-CM | POA: Diagnosis not present

## 2019-12-15 DIAGNOSIS — R05 Cough: Secondary | ICD-10-CM | POA: Insufficient documentation

## 2019-12-15 DIAGNOSIS — Z8616 Personal history of COVID-19: Secondary | ICD-10-CM | POA: Insufficient documentation

## 2019-12-15 DIAGNOSIS — F488 Other specified nonpsychotic mental disorders: Secondary | ICD-10-CM

## 2019-12-15 DIAGNOSIS — R0602 Shortness of breath: Secondary | ICD-10-CM

## 2019-12-15 DIAGNOSIS — R4189 Other symptoms and signs involving cognitive functions and awareness: Secondary | ICD-10-CM | POA: Insufficient documentation

## 2019-12-15 DIAGNOSIS — R059 Cough, unspecified: Secondary | ICD-10-CM | POA: Insufficient documentation

## 2019-12-15 MED ORDER — BUDESONIDE-FORMOTEROL FUMARATE 80-4.5 MCG/ACT IN AERO: 2.0000 | INHALATION_SPRAY | Freq: Two times a day (BID) | RESPIRATORY_TRACT | 3 refills | Status: DC

## 2019-12-15 MED ORDER — ALBUTEROL SULFATE HFA 108 (90 BASE) MCG/ACT IN AERS: 2.0000 | INHALATION_SPRAY | Freq: Four times a day (QID) | RESPIRATORY_TRACT | 0 refills | Status: DC | PRN

## 2019-12-15 NOTE — Assessment & Plan Note (Signed)
Shortness of breath:  Stay active  Walked in office today sats remained above 95% for entire walk - heart rate stable  May stop Advair - no relief noted  Will trial Symbicort  May use albuterol as needed for cough and shortness of breath  Cough:  -Trial gastroesophageal reflux disease treatment with elevating the head your bed and taking antacids  -Trial antihistamines and nasal fluticasone to help with allergic rhinitis  -You need to try to suppress your cough to allow your larynx (voice box) to heal.  For three days don't talk, laugh, sing, or clear your throat. Do everything you can to suppress the cough during this time.  -Use hard candies (sugarless Jolly Ranchers) or non-mint or non-menthol containing cough drops during this time to soothe your throat.    -Use a cough suppressant (Delsym) around the clock during this time.  After three days, gradually increase the use of your voice and back off on the cough suppressants.  Will order chest x ray and call with results  Brain Fog/Memory Loss:  Will refer to neurology  Loss of taste and Smell

## 2019-12-15 NOTE — Progress Notes (Signed)
@Patient  ID: Annette Silva, female    DOB: 1960-09-15, 59 y.o.   MRN: [id number]  Chief Complaint  Patient presents with  . Post COVID    Tested positive November 2020, still having Sx such as cough, faitgue, brain fog, some insomnia. no taste and smell.     Referring provider: Claretta Fraise, MD   59 year old female with history of mini stroke, hypothyroidism, anxiety, depression.  Diagnosed with Covid in November 2020.  HPI  Patient presents today for post Covid care clinic visit.  Patient was diagnosed with Covid in November 2020.  She states that since that time she has been experiencing chronic cough, shortness of breath, fatigue, brain fog, memory loss, and loss of taste and smell.  She tries to stay active.  She is compliant with medications.  Her doctor has started her on Advair but patient states that she has not been able to tell a difference with this medication.  She did not start Tessalon Perles as directed by her PCP.  She was concerned about side effects.  She states that her cough is nonproductive.  She states that her shortness of breath is on exertion.  She states that she does have partial taste and smell, but cannot differentiate between certain smells and taste.Denies f/c/s, n/v/d, hemoptysis, PND, chest pain or edema. Patient states that she has not had a chest x ray since she was diagnosed with covid.    Note: Patient walked in office today and O2 sats remained stable during entire walk - heart rate stable.   Mini-cog score = 4    Allergies  Allergen Reactions  . Benadryl [Diphenhydramine Hcl (Sleep)]     HYPER     There is no immunization history on file for this patient.  Past Medical History:  Diagnosis Date  . Allergy   . Anxiety   . Depression   . Mini stroke ([place]) 5/ 2015  . Salivary stones   . Thyroid disease     Tobacco History: Social History   Tobacco Use  Smoking Status Never Smoker  Smokeless Tobacco Never Used   Counseling given:  Yes   Outpatient Encounter Medications as of 12/15/2019  Medication Sig  . benzonatate (TESSALON) 200 MG capsule Take 1 capsule (200 mg total) by mouth 3 (three) times daily as needed for cough.  . Calcium Carbonate-Vit D-Min (CALCIUM 1200 PO) Take by mouth.  . Cholecalciferol (D3 ADULT PO) Take 800 mg by mouth daily.  . Ibuprofen (ADVIL PO) Take by mouth as needed.  Marland Kitchen levothyroxine (SYNTHROID) 75 MCG tablet Take 1 tablet (75 mcg total) by mouth daily before breakfast.  . Multiple Vitamins-Minerals (CENTRUM SILVER ADULT 50+ PO) Take 1 tablet by mouth daily.  . Omega-3 Fatty Acids (FISH OIL) 1200 MG CAPS Take 2 capsules by mouth daily.  . vitamin B-12 (CYANOCOBALAMIN) 1000 MCG tablet Take 1,000 mcg by mouth daily.  . [DISCONTINUED] Fluticasone-Salmeterol (ADVAIR DISKUS) 250-50 MCG/DOSE AEPB Inhale 1 puff into the lungs in the morning and at bedtime.  Marland Kitchen albuterol (VENTOLIN HFA) 108 (90 Base) MCG/ACT inhaler Inhale 2 puffs into the lungs every 6 (six) hours as needed for wheezing or shortness of breath.  . budesonide-formoterol (SYMBICORT) 80-4.5 MCG/ACT inhaler Inhale 2 puffs into the lungs 2 (two) times daily.   No facility-administered encounter medications on file as of 12/15/2019.     Review of Systems  Review of Systems  Constitutional: Positive for activity change (decreased) and fatigue. Negative for fever.  HENT:  Negative.   Respiratory: Positive for cough (non productive) and shortness of breath. Negative for wheezing.   Cardiovascular: Negative.  Negative for chest pain, palpitations and leg swelling.  Gastrointestinal: Negative.   Allergic/Immunologic: Negative.   Neurological: Negative.   Psychiatric/Behavioral: Positive for decreased concentration.       Physical Exam  BP 128/88 (BP Location: Left Arm, Patient Position: Sitting, Cuff Size: Small)   Pulse 65   Temp (!) 96.8 F (36 C)   Ht 5' 5.75" (1.67 m)   Wt 150 lb 8 oz (68.3 kg)   LMP 07/01/1996   SpO2 99%    BMI 24.48 kg/m   Wt Readings from Last 5 Encounters:  12/15/19 150 lb 8 oz (68.3 kg)  12/13/19 152 lb 6 oz (69.1 kg)  11/15/19 150 lb (68 kg)  10/25/19 151 lb 12.8 oz (68.9 kg)  04/11/19 142 lb (64.4 kg)     Physical Exam Vitals and nursing note reviewed.  Constitutional:      General: She is not in acute distress.    Appearance: She is well-developed.  Cardiovascular:     Rate and Rhythm: Normal rate and regular rhythm.  Pulmonary:     Effort: Pulmonary effort is normal. No respiratory distress.     Breath sounds: Normal breath sounds. No wheezing or rhonchi.  Musculoskeletal:     Right lower leg: No edema.     Left lower leg: No edema.  Neurological:     Mental Status: She is alert and oriented to person, place, and time.  Psychiatric:        Mood and Affect: Mood normal.        Behavior: Behavior normal.      Lab Results:  CBC    Component Value Date/Time   WBC 3.7 10/25/2019 1404   RBC 4.72 10/25/2019 1404   HGB 13.8 10/25/2019 1404   HCT 41.4 10/25/2019 1404   PLT 220 10/25/2019 1404   MCV 88 10/25/2019 1404   MCH 29.2 10/25/2019 1404   MCHC 33.3 10/25/2019 1404   RDW 12.4 10/25/2019 1404   LYMPHSABS 1.7 10/25/2019 1404   EOSABS 0.1 10/25/2019 1404   BASOSABS 0.0 10/25/2019 1404    BMET    Component Value Date/Time   NA 143 10/25/2019 1404   K 4.1 10/25/2019 1404   CL 102 10/25/2019 1404   CO2 26 10/25/2019 1404   GLUCOSE 80 10/25/2019 1404   BUN 11 10/25/2019 1404   CREATININE 0.94 10/25/2019 1404   CALCIUM 9.5 10/25/2019 1404   GFRNONAA 66 10/25/2019 1404   GFRAA 76 10/25/2019 1404     Assessment & Plan:   History of COVID-19 Shortness of breath:  Stay active  Walked in office today sats remained above 95% for entire walk - heart rate stable  May stop Advair - no relief noted  Will trial Symbicort  May use albuterol as needed for cough and shortness of breath  Cough:  -Trial gastroesophageal reflux disease treatment with  elevating the head your bed and taking antacids  -Trial antihistamines and nasal fluticasone to help with allergic rhinitis  -You need to try to suppress your cough to allow your larynx (voice box) to heal.  For three days don't talk, laugh, sing, or clear your throat. Do everything you can to suppress the cough during this time.  -Use hard candies (sugarless Jolly Ranchers) or non-mint or non-menthol containing cough drops during this time to soothe your throat.    -Use a cough  suppressant (Delsym) around the clock during this time.  After three days, gradually increase the use of your voice and back off on the cough suppressants.  Will order chest x ray and call with results  Brain Fog/Memory Loss:  Will refer to neurology  Loss of taste and Smell      Fenton Foy, NP 12/15/2019

## 2019-12-15 NOTE — Patient Instructions (Addendum)
Shortness of breath:  Stay active  Walked in office today sats remained above 95% for entire walk - heart rate stable  May stop Advair - no relief noted  Will trial Symbicort  May use albuterol as needed for cough and shortness of breath  Cough:  -Trial gastroesophageal reflux disease treatment with elevating the head your bed and taking antacids  -Trial antihistamines and nasal fluticasone to help with allergic rhinitis  -You need to try to suppress your cough to allow your larynx (voice box) to heal.  For three days don't talk, laugh, sing, or clear your throat. Do everything you can to suppress the cough during this time.  -Use hard candies (sugarless Jolly Ranchers) or non-mint or non-menthol containing cough drops during this time to soothe your throat.    -Use a cough suppressant (Delsym) around the clock during this time.  After three days, gradually increase the use of your voice and back off on the cough suppressants.  Will order chest x ray and call with results  Brain Fog/Memory Loss:  Will refer to neurology  Loss of taste and Smell    Follow up as needed

## 2019-12-18 NOTE — Progress Notes (Signed)
Patient notified of results, verbally understood. No additional questions.

## 2019-12-29 ENCOUNTER — Other Ambulatory Visit: Payer: Self-pay

## 2019-12-29 ENCOUNTER — Ambulatory Visit (INDEPENDENT_AMBULATORY_CARE_PROVIDER_SITE_OTHER): Admitting: Nurse Practitioner

## 2019-12-29 DIAGNOSIS — Z8616 Personal history of COVID-19: Secondary | ICD-10-CM

## 2019-12-29 NOTE — Patient Instructions (Addendum)
History of COVID-19 Shortness of breath:  Glad you are getting better!  Stay active  May stop Symbicort - shortness of breath improved with reflux medications  May use albuterol as needed for cough and shortness of breath  Cough:  -Continue - gastroesophageal reflux disease treatment with elevating the head your bed and taking antacids  -Continue -  antihistamines and nasal fluticasone to help with allergic rhinitis  -May use Delsym if needed for cough   Brain Fog/Memory Loss:  Awaiting appointment with neurology  Loss of taste and Smell - slowly improving with use of essential oils  Fatigue:  Now that your shortness of breath and cough are better - start a low impact exercise routine to build your strength back up - water aerobics, walking, or yoga would be good - start slow and increase activity as tolerated.    Follow up after neurology appointment or sooner if needed

## 2019-12-29 NOTE — Progress Notes (Signed)
@Patient  ID: Annette Silva, female    DOB: [birth year], 59 y.o.   MRN: [id number]  Chief Complaint  Patient presents with  . Follow-up    Patient stated she is still having brain fog and tingling in her hands and feet. Aware of neurology appt on 5/24. Patient did recieve her 1st dose of vaccine on 4/22    Referring provider: Claretta Fraise, MD   59 year old female with history of mini stroke, hypothyroidism, anxiety, depression.  Diagnosed with Covid in November 2020.  HPI  Patient presents today for post Covid care clinic follow-up.  Patient states that her shortness of breath and cough are improved.  She was compliant with cough regimen as outlined at her last visit here.  She states that this did work and she is no longer short of breath and no longer has a cough.  She has been able to stop all of her inhalers.  She continues to take Prilosec and Zyrtec daily.  She no longer needs cough medicine.  Her chest x-ray at last visit was clear.  Patient states that she is still experiencing neurological symptoms such as memory loss and brain fog as well as numbness and tingling to bilateral hands and feet.  Patient does have an appointment scheduled with neurology on May 24.  Patient has not been exercising due to previously being short of breath.  She has become somewhat physically deconditioned.  Patient states that she did get her first Covid vaccine.Denies f/c/s, n/v/d, hemoptysis, PND, leg swelling, chest pain or edema.        Allergies  Allergen Reactions  . Benadryl [Diphenhydramine Hcl (Sleep)]     HYPER     There is no immunization history on file for this patient.  Past Medical History:  Diagnosis Date  . Allergy   . Anxiety   . Depression   . Mini stroke ([place]) 5/ 2015  . Salivary stones   . Thyroid disease     Tobacco History: Social History   Tobacco Use  Smoking Status Never Smoker  Smokeless Tobacco Never Used   Counseling given: Not Answered   Outpatient  Encounter Medications as of 12/29/2019  Medication Sig  . albuterol (VENTOLIN HFA) 108 (90 Base) MCG/ACT inhaler Inhale 2 puffs into the lungs every 6 (six) hours as needed for wheezing or shortness of breath.  . benzonatate (TESSALON) 200 MG capsule Take 1 capsule (200 mg total) by mouth 3 (three) times daily as needed for cough.  . budesonide-formoterol (SYMBICORT) 80-4.5 MCG/ACT inhaler Inhale 2 puffs into the lungs 2 (two) times daily.  . Calcium Carbonate-Vit D-Min (CALCIUM 1200 PO) Take by mouth.  . Cholecalciferol (D3 ADULT PO) Take 800 mg by mouth daily.  . Ibuprofen (ADVIL PO) Take by mouth as needed.  Marland Kitchen levothyroxine (SYNTHROID) 75 MCG tablet Take 1 tablet (75 mcg total) by mouth daily before breakfast.  . Multiple Vitamins-Minerals (CENTRUM SILVER ADULT 50+ PO) Take 1 tablet by mouth daily.  . Omega-3 Fatty Acids (FISH OIL) 1200 MG CAPS Take 2 capsules by mouth daily.  . vitamin B-12 (CYANOCOBALAMIN) 1000 MCG tablet Take 1,000 mcg by mouth daily.   No facility-administered encounter medications on file as of 12/29/2019.     Review of Systems  Review of Systems  Constitutional: Positive for fatigue.  HENT: Negative.   Respiratory: Negative for cough, shortness of breath and wheezing.   Cardiovascular: Negative.  Negative for chest pain, palpitations and leg swelling.  Gastrointestinal: Negative.  Allergic/Immunologic: Negative.   Neurological: Negative.   Psychiatric/Behavioral: Positive for decreased concentration.       Physical Exam  BP 116/82 (BP Location: Left Arm, Patient Position: Sitting, Cuff Size: Small)   Pulse 75   Temp (!) 97.3 F (36.3 C)   Wt 154 lb 4 oz (70 kg)   LMP 07/01/1996   SpO2 98%   BMI 25.09 kg/m   Wt Readings from Last 5 Encounters:  12/29/19 154 lb 4 oz (70 kg)  12/15/19 150 lb 8 oz (68.3 kg)  12/13/19 152 lb 6 oz (69.1 kg)  11/15/19 150 lb (68 kg)  10/25/19 151 lb 12.8 oz (68.9 kg)     Physical Exam Vitals and nursing note  reviewed.  Constitutional:      General: She is not in acute distress.    Appearance: She is well-developed.  Cardiovascular:     Rate and Rhythm: Normal rate and regular rhythm.  Pulmonary:     Effort: Pulmonary effort is normal. No respiratory distress.     Breath sounds: Normal breath sounds. No wheezing or rhonchi.  Musculoskeletal:     Right lower leg: No edema.     Left lower leg: No edema.  Neurological:     Mental Status: She is alert and oriented to person, place, and time.  Psychiatric:        Mood and Affect: Mood normal.        Behavior: Behavior normal.     Imaging: DG Chest 2 View  Result Date: 12/15/2019 CLINICAL DATA:  59 year old female status post COVID-19 in November. Continued cough, loss of taste and smell. EXAM: CHEST - 2 VIEW COMPARISON:  None. FINDINGS: Lung volumes are at the upper limits of normal. Mediastinal contours are within normal limits. Visualized tracheal air column is within normal limits. Both lungs appear clear. No pneumothorax or pleural effusion. Osteopenia. No acute osseous abnormality identified. Negative visible bowel gas pattern. IMPRESSION: Negative.  No acute cardiopulmonary abnormality. Electronically Signed   By: Genevie Ann M.D.   On: 12/15/2019 15:04     Assessment & Plan:   History of COVID-19 History of COVID-19 Shortness of breath:  Glad you are getting better!  Stay active  May stop Symbicort - shortness of breath improved with reflux medications  May use albuterol as needed for cough and shortness of breath  Cough:  -Continue - gastroesophageal reflux disease treatment with elevating the head your bed and taking antacids  -Continue -  antihistamines and nasal fluticasone to help with allergic rhinitis  -May use Delsym if needed for cough   Brain Fog/Memory Loss:  Awaiting appointment with neurology  Loss of taste and Smell - slowly improving with use of essential oils  Fatigue:  Now that your  shortness of breath and cough are better - start a low impact exercise routine to build your strength back up - water aerobics, walking, or yoga would be good - start slow and increase activity as tolerated.    Follow up after neurology appointment or sooner if needed      Fenton Foy, NP 12/29/2019

## 2019-12-29 NOTE — Assessment & Plan Note (Signed)
History of COVID-19 Shortness of breath:  Glad you are getting better!  Stay active  May stop Symbicort - shortness of breath improved with reflux medications  May use albuterol as needed for cough and shortness of breath  Cough:  -Continue - gastroesophageal reflux disease treatment with elevating the head your bed and taking antacids  -Continue -  antihistamines and nasal fluticasone to help with allergic rhinitis  -May use Delsym if needed for cough   Brain Fog/Memory Loss:  Awaiting appointment with neurology  Loss of taste and Smell - slowly improving with use of essential oils  Fatigue:  Now that your shortness of breath and cough are better - start a low impact exercise routine to build your strength back up - water aerobics, walking, or yoga would be good - start slow and increase activity as tolerated.    Follow up after neurology appointment or sooner if needed

## 2020-01-22 ENCOUNTER — Other Ambulatory Visit: Payer: Self-pay

## 2020-01-22 ENCOUNTER — Encounter: Payer: Self-pay | Admitting: Diagnostic Neuroimaging

## 2020-01-22 ENCOUNTER — Ambulatory Visit (INDEPENDENT_AMBULATORY_CARE_PROVIDER_SITE_OTHER): Admitting: Diagnostic Neuroimaging

## 2020-01-22 VITALS — BP 120/84 | HR 76 | Ht 66.0 in | Wt 152.0 lb

## 2020-01-22 DIAGNOSIS — F488 Other specified nonpsychotic mental disorders: Secondary | ICD-10-CM | POA: Diagnosis not present

## 2020-01-22 DIAGNOSIS — Z8616 Personal history of COVID-19: Secondary | ICD-10-CM

## 2020-01-22 DIAGNOSIS — R413 Other amnesia: Secondary | ICD-10-CM

## 2020-01-22 DIAGNOSIS — R4189 Other symptoms and signs involving cognitive functions and awareness: Secondary | ICD-10-CM

## 2020-01-22 NOTE — Progress Notes (Signed)
[facility]  PATIENT: Annette Silva DOB: 1960-09-17  REFERRING CLINICIAN: Claretta Fraise, MD HISTORY FROM: patient  REASON FOR VISIT: new consult    HISTORICAL  CHIEF COMPLAINT:  Chief Complaint  Patient presents with  . Memory Loss    rm 7 New Pt, hx of Covid Nov 2020, has received Covid vaccine, "brain fog, can't get words out, forgetting how to do things I've done for a long time; don't have all taste/smell back; hands/feet go numb; some fatigue/insomnia"  MMSE 26    HISTORY OF PRESENT ILLNESS:   59 year old female here for evaluation of post Covid brain fog and memory loss.  Patient had Covid in November 2020.  She had cough shortness of breath fatigue headaches loss of smell and taste, acid reflux and numbness tingling.  Patient had first Covid vaccine in April 2021 and second vaccine last Friday.  Since then symptoms have been stable.  She continues to have insomnia, feeling cold, fatigue, brain fog, loss of smell and taste.  She sleeps about 5 to 6 hours at night.    REVIEW OF SYSTEMS: Full 14 system review of systems performed and negative with exception of: As per HPI.  ALLERGIES: Allergies  Allergen Reactions  . Benadryl [Diphenhydramine Hcl (Sleep)]     HYPER    HOME MEDICATIONS: Outpatient Medications Prior to Visit  Medication Sig Dispense Refill  . Calcium Carbonate-Vit D-Min (CALCIUM 1200 PO) Take by mouth.    . Cholecalciferol (D3 ADULT PO) Take 800 mg by mouth daily.    . Ibuprofen (ADVIL PO) Take by mouth as needed.    Marland Kitchen levothyroxine (SYNTHROID) 75 MCG tablet Take 1 tablet (75 mcg total) by mouth daily before breakfast. 90 tablet 1  . Multiple Vitamins-Minerals (CENTRUM SILVER ADULT 50+ PO) Take 1 tablet by mouth daily.    . Omega-3 Fatty Acids (FISH OIL) 1200 MG CAPS Take 2 capsules by mouth daily.    Marland Kitchen omeprazole (PRILOSEC) 20 MG capsule Take 20 mg by mouth daily.    . vitamin B-12 (CYANOCOBALAMIN) 1000 MCG tablet Take 1,000 mcg  by mouth daily.    Marland Kitchen albuterol (VENTOLIN HFA) 108 (90 Base) MCG/ACT inhaler Inhale 2 puffs into the lungs every 6 (six) hours as needed for wheezing or shortness of breath. (Patient not taking: Reported on 01/22/2020) 6.7 g 0  . benzonatate (TESSALON) 200 MG capsule Take 1 capsule (200 mg total) by mouth 3 (three) times daily as needed for cough. (Patient not taking: Reported on 01/22/2020) 20 capsule 0  . budesonide-formoterol (SYMBICORT) 80-4.5 MCG/ACT inhaler Inhale 2 puffs into the lungs 2 (two) times daily. (Patient not taking: Reported on 01/22/2020) 1 Inhaler 3   No facility-administered medications prior to visit.    PAST MEDICAL HISTORY: Past Medical History:  Diagnosis Date  . Allergy   . Anxiety   . COVID-19 virus infection 07/2019   hx of  . Depression   . Mini stroke ([place]) 5/ 2015  . Salivary stones   . Thyroid disease     PAST SURGICAL HISTORY: Past Surgical History:  Procedure Laterality Date  . ABDOMINAL HYSTERECTOMY  11 / 1997   partial   . [place]  . TOOTH EXTRACTION Left     FAMILY HISTORY: Family History  Problem Relation Age of Onset  . Stroke Mother        passed at age 13  . Arthritis Mother   . COPD Mother   . Depression Mother   .  Diabetes Mother   . Hearing loss Mother   . Heart disease Mother   . Hypertension Mother   . Cancer Father        bone passed at 61    SOCIAL HISTORY: Social History   Socioeconomic History  . Marital status: Married    Spouse name: Eddie Dibbles  . Number of children: Not on file  . Years of education: Not on file  . Highest education level: High school graduate  Occupational History  . Not on file  Tobacco Use  . Smoking status: Never Smoker  . Smokeless tobacco: Never Used  Substance and Sexual Activity  . Alcohol use: Yes    Comment: socially  . Drug use: No  . Sexual activity: Never  Other Topics Concern  . Not on file  Social History Narrative   Lives with husband   Caffeine- coffee 4 c  daily   Social Determinants of Health   Financial Resource Strain:   . Difficulty of Paying Living Expenses:   Food Insecurity:   . Worried About Charity fundraiser in the Last Year:   . Arboriculturist in the Last Year:   Transportation Needs:   . Film/video editor (Medical):   Marland Kitchen Lack of Transportation (Non-Medical):   Physical Activity:   . Days of Exercise per Week:   . Minutes of Exercise per Session:   Stress:   . Feeling of Stress :   Social Connections:   . Frequency of Communication with Friends and Family:   . Frequency of Social Gatherings with Friends and Family:   . Attends Religious Services:   . Active Member of Clubs or Organizations:   . Attends Archivist Meetings:   Marland Kitchen Marital Status:   Intimate Partner Violence:   . Fear of Current or Ex-Partner:   . Emotionally Abused:   Marland Kitchen Physically Abused:   . Sexually Abused:      PHYSICAL EXAM  GENERAL EXAM/CONSTITUTIONAL: Vitals:  Vitals:   01/22/20 0809  BP: 120/84  Pulse: 76  Weight: 152 lb (68.9 kg)  Height: 5\' 6"  (1.676 m)     Body mass index is 24.53 kg/m. Wt Readings from Last 3 Encounters:  01/22/20 152 lb (68.9 kg)  12/29/19 154 lb 4 oz (70 kg)  12/15/19 150 lb 8 oz (68.3 kg)     Patient is in no distress; well developed, nourished and groomed; neck is supple  CARDIOVASCULAR:  Examination of carotid arteries is normal; no carotid bruits  Regular rate and rhythm, no murmurs  Examination of peripheral vascular system by observation and palpation is normal  EYES:  Ophthalmoscopic exam of optic discs and posterior segments is normal; no papilledema or hemorrhages  No exam data present  MUSCULOSKELETAL:  Gait, strength, tone, movements noted in Neurologic exam below  NEUROLOGIC: MENTAL STATUS:  MMSE - Mini Mental State Exam 01/22/2020  Orientation to time 5  Orientation to Place 4  Registration 3  Attention/ Calculation 3  Recall 2  Language- name 2 objects 2   Language- repeat 1  Language- follow 3 step command 3  Language- read & follow direction 1  Write a sentence 1  Copy design 1  Total score 26    awake, alert, oriented to person, place and time  recent and remote memory intact  normal attention and concentration  language fluent, comprehension intact, naming intact  fund of knowledge appropriate  CRANIAL NERVE:   2nd - no papilledema on  fundoscopic exam  2nd, 3rd, 4th, 6th - pupils equal and reactive to light, visual fields full to confrontation, extraocular muscles intact, no nystagmus  5th - facial sensation symmetric  7th - facial strength symmetric  8th - hearing intact  9th - palate elevates symmetrically, uvula midline  11th - shoulder shrug symmetric  12th - tongue protrusion midline  MOTOR:   normal bulk and tone, full strength in the BUE, BLE; EXCEPT RIGHT HF, KE 4 (LIMITED BY RIGHT HIP PAIN)  SENSORY:   normal and symmetric to light touch, temperature, vibration; slightly decr in feet  COORDINATION:   finger-nose-finger, fine finger movements normal  REFLEXES:   deep tendon reflexes TRACE present and symmetric  GAIT/STATION:   narrow based gait     DIAGNOSTIC DATA (LABS, IMAGING, TESTING) - I reviewed patient records, labs, notes, testing and imaging myself where available.  Lab Results  Component Value Date   WBC 3.7 10/25/2019   HGB 13.8 10/25/2019   HCT 41.4 10/25/2019   MCV 88 10/25/2019   PLT 220 10/25/2019      Component Value Date/Time   NA 143 10/25/2019 1404   K 4.1 10/25/2019 1404   CL 102 10/25/2019 1404   CO2 26 10/25/2019 1404   GLUCOSE 80 10/25/2019 1404   BUN 11 10/25/2019 1404   CREATININE 0.94 10/25/2019 1404   CALCIUM 9.5 10/25/2019 1404   PROT 6.5 10/25/2019 1404   ALBUMIN 4.5 10/25/2019 1404   AST 28 10/25/2019 1404   ALT 26 10/25/2019 1404   ALKPHOS 78 10/25/2019 1404   BILITOT 0.3 10/25/2019 1404   GFRNONAA 66 10/25/2019 1404   GFRAA 76 10/25/2019  1404   Lab Results  Component Value Date   CHOL 220 (H) 10/25/2019   HDL 84 10/25/2019   LDLCALC 122 (H) 10/25/2019   TRIG 78 10/25/2019   CHOLHDL 2.6 10/25/2019   No results found for: HGBA1C Lab Results  Component Value Date   VITAMINB12 >2000 (H) 10/25/2019   Lab Results  Component Value Date   TSH 2.890 12/13/2019       ASSESSMENT AND PLAN  59 y.o. year old female here with post Covid symptoms of brain fog, loss of smell and taste, headaches, insomnia, feeling cold since November 2020.  Dx:  1. History of COVID-19   2. Memory loss   3. Brain fog     PLAN:  POST-COVID SYNDROME (fatigue, brain fog, numbness, loss of smell / taste) - supportive care; brain and physical activities reviewed; sleep and nutrition reviewed  Return for pending if symptoms worsen or fail to improve, return to PCP.    Penni Bombard, MD [id number], [id number] AM Certified in Neurology, Neurophysiology and Neuroimaging  [facility] [address], [place], [place] [address] [phone]

## 2020-01-26 ENCOUNTER — Other Ambulatory Visit: Payer: Self-pay | Admitting: Family Medicine

## 2020-01-26 DIAGNOSIS — Z1231 Encounter for screening mammogram for malignant neoplasm of breast: Secondary | ICD-10-CM

## 2020-02-02 ENCOUNTER — Other Ambulatory Visit: Payer: Self-pay

## 2020-02-02 ENCOUNTER — Ambulatory Visit (INDEPENDENT_AMBULATORY_CARE_PROVIDER_SITE_OTHER): Admitting: Nurse Practitioner

## 2020-02-02 VITALS — BP 114/62 | HR 60 | Temp 97.3°F | Wt 153.0 lb

## 2020-02-02 DIAGNOSIS — R202 Paresthesia of skin: Secondary | ICD-10-CM | POA: Diagnosis not present

## 2020-02-02 DIAGNOSIS — R4189 Other symptoms and signs involving cognitive functions and awareness: Secondary | ICD-10-CM

## 2020-02-02 DIAGNOSIS — F488 Other specified nonpsychotic mental disorders: Secondary | ICD-10-CM

## 2020-02-02 DIAGNOSIS — Z8616 Personal history of COVID-19: Secondary | ICD-10-CM | POA: Diagnosis not present

## 2020-02-02 DIAGNOSIS — R200 Anesthesia of skin: Secondary | ICD-10-CM

## 2020-02-02 NOTE — Patient Instructions (Addendum)
Brain Fog/Memory Loss Trouble finding words Numbness and tingling to arms and legs:  Will refer to neurophysical therapy for speech and physical therapy  Loss of taste and Smell - slowly improving with use of essential oils   Follow up:  Follow up in 3 months

## 2020-02-02 NOTE — Progress Notes (Signed)
@Patient  ID: Annette Silva, female    DOB: May 13, 1961, 59 y.o.   MRN: [id number]  Chief Complaint  Patient presents with  . Follow-up    numbess in fingers and feet; brain fog; trouble getting words out    Referring provider: Claretta Fraise, MD   59 year old female with history ofmini stroke, hypothyroidism, anxiety, depression.Diagnosed with Covid in November 2020.  HPI  Patient presents today for post Covid care clinic follow-up.  Since last visit patient has been to see neurology.  She did have cognitive testing done there and neurologist suggested supportive care and thought that symptoms would improve over time.  Patient still complains of ongoing brain fog and numbness and tingling to hands and feet.  She states that she is having trouble at times trying to find her words when having a conversation.  She states that her cough and shortness of breath have completely subsided at this point.  She is getting more energy back. Denies f/c/s, n/v/d, hemoptysis, PND, chest pain or edema.      Allergies  Allergen Reactions  . Benadryl [Diphenhydramine Hcl (Sleep)]     HYPER    Immunization History  Administered Date(s) Administered  . Moderna SARS-COVID-2 Vaccination 12/21/2019, 01/19/2020    Past Medical History:  Diagnosis Date  . Allergy   . Anxiety   . COVID-19 virus infection 07/2019   hx of  . Depression   . Mini stroke ([place]) 5/ 2015  . Salivary stones   . Thyroid disease     Tobacco History: Social History   Tobacco Use  Smoking Status Never Smoker  Smokeless Tobacco Never Used   Counseling given: Not Answered   Outpatient Encounter Medications as of 02/02/2020  Medication Sig  . Calcium Carbonate-Vit D-Min (CALCIUM 1200 PO) Take by mouth.  . Cholecalciferol (D3 ADULT PO) Take 800 mg by mouth daily.  . Ibuprofen (ADVIL PO) Take by mouth as needed.  Marland Kitchen levothyroxine (SYNTHROID) 75 MCG tablet Take 1 tablet (75 mcg total) by mouth daily before breakfast.   . Multiple Vitamins-Minerals (CENTRUM SILVER ADULT 50+ PO) Take 1 tablet by mouth daily.  . Omega-3 Fatty Acids (FISH OIL) 1200 MG CAPS Take 2 capsules by mouth daily.  Marland Kitchen omeprazole (PRILOSEC) 20 MG capsule Take 20 mg by mouth daily.  . vitamin B-12 (CYANOCOBALAMIN) 1000 MCG tablet Take 1,000 mcg by mouth daily.  Marland Kitchen albuterol (VENTOLIN HFA) 108 (90 Base) MCG/ACT inhaler Inhale 2 puffs into the lungs every 6 (six) hours as needed for wheezing or shortness of breath. (Patient not taking: Reported on 01/22/2020)  . budesonide-formoterol (SYMBICORT) 80-4.5 MCG/ACT inhaler Inhale 2 puffs into the lungs 2 (two) times daily. (Patient not taking: Reported on 01/22/2020)  . [DISCONTINUED] benzonatate (TESSALON) 200 MG capsule Take 1 capsule (200 mg total) by mouth 3 (three) times daily as needed for cough. (Patient not taking: Reported on 01/22/2020)   No facility-administered encounter medications on file as of 02/02/2020.     Review of Systems  Review of Systems  Constitutional: Negative.  Negative for chills and fever.  HENT: Negative.   Respiratory: Negative for cough and shortness of breath.   Cardiovascular: Negative.  Negative for chest pain, palpitations and leg swelling.  Gastrointestinal: Negative.   Allergic/Immunologic: Negative.   Neurological: Negative.        Numbness and tingling to hands and feet  Psychiatric/Behavioral: Positive for decreased concentration.       Physical Exam  BP 114/62 (BP Location: Left Arm, Patient  Position: Sitting, Cuff Size: Small)   Pulse 60   Temp (!) 97.3 F (36.3 C)   Wt 153 lb (69.4 kg)   LMP 07/01/1996   SpO2 98%   BMI 24.69 kg/m   Wt Readings from Last 5 Encounters:  02/02/20 153 lb (69.4 kg)  01/22/20 152 lb (68.9 kg)  12/29/19 154 lb 4 oz (70 kg)  12/15/19 150 lb 8 oz (68.3 kg)  12/13/19 152 lb 6 oz (69.1 kg)     Physical Exam Vitals and nursing note reviewed.  Constitutional:      General: She is not in acute distress.     Appearance: She is well-developed.  Cardiovascular:     Rate and Rhythm: Normal rate and regular rhythm.  Pulmonary:     Effort: Pulmonary effort is normal.     Breath sounds: Normal breath sounds.  Musculoskeletal:     Right lower leg: No edema.     Left lower leg: No edema.  Neurological:     Mental Status: She is alert and oriented to person, place, and time.  Psychiatric:        Mood and Affect: Mood normal.        Behavior: Behavior normal.        Assessment & Plan:   History of COVID-19 Brain Fog/Memory Loss Trouble finding words Numbness and tingling to arms and legs:  Will refer to neurorehabilitation center for speech and physical therapy  Loss of taste and Smell - slowly improving with use of essential oils   Follow up:  Follow up in 3 months      Fenton Foy, NP 02/02/2020

## 2020-02-02 NOTE — Assessment & Plan Note (Signed)
Brain Fog/Memory Loss Trouble finding words Numbness and tingling to arms and legs:  Will refer to neurorehabilitation center for speech and physical therapy  Loss of taste and Smell - slowly improving with use of essential oils   Follow up:  Follow up in 3 months

## 2020-02-05 ENCOUNTER — Other Ambulatory Visit: Payer: Self-pay

## 2020-02-05 ENCOUNTER — Ambulatory Visit: Attending: Nurse Practitioner

## 2020-02-05 ENCOUNTER — Ambulatory Visit

## 2020-02-05 DIAGNOSIS — M6281 Muscle weakness (generalized): Secondary | ICD-10-CM | POA: Insufficient documentation

## 2020-02-05 DIAGNOSIS — R482 Apraxia: Secondary | ICD-10-CM | POA: Insufficient documentation

## 2020-02-05 DIAGNOSIS — R209 Unspecified disturbances of skin sensation: Secondary | ICD-10-CM | POA: Diagnosis present

## 2020-02-05 DIAGNOSIS — R41841 Cognitive communication deficit: Secondary | ICD-10-CM | POA: Diagnosis present

## 2020-02-05 DIAGNOSIS — R208 Other disturbances of skin sensation: Secondary | ICD-10-CM

## 2020-02-05 NOTE — Therapy (Signed)
[facility] [address] [place], [place], [address] Phone: [phone]   Fax:  [phone]  Physical Therapy Evaluation  Patient Details  Name: Annette Silva MRN: [id number] Date of Birth: 07/16/1961 Referring Provider (PT): Lazaro Arms, NP   Encounter Date: 02/05/2020  PT End of Session - 02/05/20 0945    Visit Number  1    Number of Visits  1    Authorization Type  [employer]    PT Start Time  9924   finishing up with ST Evaluation   PT Stop Time  0930    PT Time Calculation (min)  39 min    Equipment Utilized During Treatment  Gait belt    Activity Tolerance  Patient tolerated treatment well    Behavior During Therapy  [facility] for tasks assessed/performed       Past Medical History:  Diagnosis Date  . Allergy   . Anxiety   . COVID-19 virus infection 07/2019   hx of  . Depression   . Mini stroke ([place]) 5/ 2015  . Salivary stones   . Thyroid disease     Past Surgical History:  Procedure Laterality Date  . ABDOMINAL HYSTERECTOMY  11 / 1997   partial   . [place]  . TOOTH EXTRACTION Left     There were no vitals filed for this visit.   Subjective Assessment - 02/05/20 0854    Subjective  Patient reports that she was diagnosed with COVID-19 in November 2020. Since then she continues to deal with side effects include: deep cough, SOB, fatigue, brain fog, and insomnia. Reports she has been to 3 appts at [facility]. Reports that her fatigue has gradually improved. Reports her insomnia is getting better, but still continues to sleep only 5-6 hours at night. Patient reports that she has been having numbness in the toes/fingers, reports that it comes and goes. Report feels like they have went to sleep. Reports she doesnt have any physical limitations..    Pertinent History  COVID - 19 Infection, Depression, Thyroid Disease, Mini Stroke    Currently in Pain?  Yes    Pain Score  --   8.5   Pain  Location  Wrist    Pain Orientation  Right    Pain Descriptors / Indicators  Throbbing;Shooting    Pain Type  Chronic pain    Pain Onset  More than a month ago    Pain Frequency  Constant         [facility] PT Assessment - 02/05/20 0906      Assessment   Medical Diagnosis  Post COVID -19 Numbness/Tingling    Referring Provider (PT)  Lazaro Arms, NP    Onset Date/Surgical Date  --   diagnosed with COVID in Nov 2020   Hand Dominance  Right    Prior Therapy  Recieved PT for R Shoulder, History of Frozen Shoulder      Balance Screen   Has the patient fallen in the past 6 months  No    Has the patient had a decrease in activity level because of a fear of falling?   No    Is the patient reluctant to leave their home because of a fear of falling?   No      Home Environment   Living Environment  Private residence    Living Arrangements  Spouse/significant other    Available Help at Discharge  Family    Type of  Home  House    Home Access  Stairs to enter    Entrance Stairs-Number of Steps  1    Entrance Stairs-Rails  None    Home Layout  One level      Prior Function   Level of Independence  Independent    Vocation  Part time employment    Vocation Requirements  Work 5 Hours a Day, Glass blower/designer for [employer], Work in the [employer]   Overall Cognitive Status  Within [place] for tasks assessed    Memory  Impaired    Memory Impairment  Other (comment)   per patient reports      Sensation   Light Touch  Appears Intact    Hot/Cold  Appears Intact   per patient reports   Additional Comments  Reports felt like PT was pressing harder on R side vs. L      ROM / Strength   AROM / PROM / Strength  Strength;AROM      AROM   Overall AROM   Within functional limits for tasks performed    Overall AROM Comments  for BLE's      Strength   Overall Strength Comments  Patient reports that she has chronic B Hip pain which believes is also a  limiting factor    Strength Assessment Site  Hip;Knee;Ankle    Right/Left Hip  Right;Left    Right Hip Flexion  4+/5    Right Hip ABduction  4/5    Right Hip ADduction  4+/5    Left Hip Flexion  4+/5    Left Hip ABduction  4/5    Left Hip ADduction  4+/5    Right/Left Knee  Right;Left    Right Knee Flexion  5/5    Right Knee Extension  4+/5    Left Knee Flexion  5/5    Left Knee Extension  4+/5    Right/Left Ankle  Right;Left    Right Ankle Dorsiflexion  4+/5    Left Ankle Dorsiflexion  4+/5      Transfers   Transfers  Sit to Stand;Stand to Sit    Sit to Stand  5: Supervision    Stand to Sit  5: Supervision    Comments  Completed 30 second chair test - pt able to complete 7 sit <> stands in 30 seconds w/o UE support. Pt reports limited due to strength/hip pain.       Ambulation/Gait   Ambulation/Gait  Yes    Ambulation/Gait Assistance  7: Independent    Ambulation/Gait Assistance Details  pt appears to be independent with ambulation during completion of 6MWT, no instances of LOB, no altered gait pattern    Assistive device  None    Gait Pattern  Within Functional Limits    Ambulation Surface  Level;Indoor    Gait velocity  7.8 secs = 4.20 ft/sec      6 Minute Walk- Baseline   6 Minute Walk- Baseline  yes    Modified Borg Scale for Dyspnea  0- Nothing at all      6 Minute walk- Post Test   6 Minute Walk Post Test  yes    Modified Borg Scale for Dyspnea  1- Very mild shortness of breath    Perceived Rate of Exertion (Borg)  12-      6 minute walk test results    Aerobic Endurance Distance Walked  [phone]  Endurance additional comments  Patient able to ambulate 1514 ft, demonstrating good endurance at this time. No signs of SOB or excessive physical exertion with completion                  Objective measurements completed on examination: See above findings.              PT Education - 02/05/20 0905    Education Details  Educated on POC and  evaluation findings    Person(s) Educated  Patient    Methods  Explanation    Comprehension  Verbalized understanding                  Plan - 02/05/20 0946    Clinical Impression Statement  Patient is a 59 y.o. female referred to Neuro OPPT for neurological symptoms post COVID -19. Patient reports that primary concern regarding physical impairments is numbness in toes/hands bilaterally but has noticed improvements in all symptoms gradually. Upon assessment, patient demos decreased strength in B hips with patient reporting this has been chronic issue due to chronic hip pain. With completion of 6MWT, patient ambulate 1514 ft with no signs of SOB or exertion, demoing good endurance for patient's age. Patient ambulates with normal gait pattern, and no other deficits noted upon evaluation. Currently patient demonstrates no physical impairments limiting function. PT provided education regarding post COVID neurological symptoms such as numbness/tingling in hands/feet typically return gradually and make just take time. PT reporting that patient does not need PT services currently, with patient verbalizing agreement.    Personal Factors and Comorbidities  Comorbidity 2;Time since onset of injury/illness/exacerbation    Comorbidities  COVID - 19 Infection, Depression, Thyroid Disease, Mini Stroke    Examination-Participation Restrictions  Other   Work   Stability/Clinical Decision Making  Stable/Uncomplicated    Clinical Decision Making  Low    Consulted and Agree with Plan of Care  Patient       Patient will benefit from skilled therapeutic intervention in order to improve the following deficits and impairments:  Decreased endurance, Pain, Decreased activity tolerance, Decreased strength  Visit Diagnosis: Muscle weakness (generalized)  Other disturbances of skin sensation     Problem List Patient Active Problem List   Diagnosis Date Noted  . History of COVID-19 12/15/2019  . Cough  12/15/2019  . Memory loss 12/15/2019  . Brain fog 12/15/2019  . Shortness of breath 12/15/2019  . Coronavirus infection 07/04/2019  . Presbycusis of both ears 10/20/2016  . Sialolithiasis of submandibular gland 10/20/2016  . Tinnitus aurium, 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  . Hypothyroidism 04/20/2016  . Overweight (BMI 25.0-29.9) 02/20/2016    Jones Bales, PT, DPT 02/05/2020, 9:56 AM  [facility] [address] [place], [place], [address] Phone: [phone]   Fax:  [phone]  Name: Marieke Lubke MRN: [id number] Date of Birth: [birth year]

## 2020-02-05 NOTE — Therapy (Signed)
[facility] [address] [place], [place], [address] Phone: [phone]   Fax:  [phone]  Speech Language Pathology Evaluation  Patient Details  Name: Annette Silva MRN: [id number] Date of Birth: 1961-03-18 Referring Provider (SLP): Lazaro Arms NP   Encounter Date: 02/05/2020  End of Session - 02/05/20 2152    Visit Number  1    Number of Visits  17    Date for SLP Re-Evaluation  05/03/20   90 days   SLP Start Time  0804    SLP Stop Time   0845    SLP Time Calculation (min)  41 min    Activity Tolerance  Patient tolerated treatment well       Past Medical History:  Diagnosis Date  . Allergy   . Anxiety   . COVID-19 virus infection 07/2019   hx of  . Depression   . Mini stroke ([place]) 5/ 2015  . Salivary stones   . Thyroid disease     Past Surgical History:  Procedure Laterality Date  . ABDOMINAL HYSTERECTOMY  11 / 1997   partial   . [place]  . TOOTH EXTRACTION Left     There were no vitals filed for this visit.  Subjective Assessment - 02/05/20 0811    Subjective  "There will be times that I sit down to do something and I just can't    Currently in Pain?  Yes    Pain Score  8     Pain Location  Wrist    Pain Orientation  Right    Pain Descriptors / Indicators  Throbbing;Shooting    Pain Type  Chronic pain   cortisone shot Aug 31, 2019, returns 02-16-20   Pain Onset  More than a month ago    Pain Frequency  Constant         SLP Evaluation [facility] - 02/05/20 2139      SLP Visit Information   SLP Received On  02/05/20    Referring Provider (SLP)  Lazaro Arms NP    Onset Date  November 2020      Subjective   Patient/Family Stated Goal  Produce speech easier, do things she used to do prior to [place] with more efficiency      Pain Assessment   Pain Onset  More than a month ago      General Information   HPI  Pt with COVID in November 2020 now with "brain fog" and  "trouble saying words". Neurology consult recently told pt that neuro symptoms should resolve over time. However, deficits are affecting pt e.g., now no longer volunteers to read out loud whereas prior to COVID this was something she wouldn't think twice about.      Prior Functional Status   Cognitive/Linguistic Baseline  Within functional limits    Type of Home  House     Lives With  Spouse    Available Support  Family      Cognition   Overall Cognitive Status  Impaired/Different from baseline    Area of Impairment  Memory    Memory Comments  pt reports she will forget procedural sequences "maybe a couple times a week" - this is improved over the last 6 monhts.      Auditory Comprehension   Overall Auditory Comprehension  Appears within functional limits for tasks assessed      Verbal Expression   Overall Verbal Expression  Appears within functional limits for tasks  assessed      Oral Motor/Sensory Function   Labial ROM  Within Functional Limits    Labial Strength  Within Functional Limits    Labial Coordination  WFL    Lingual ROM  Within Functional Limits    Lingual Strength  Within Functional Limits    Lingual Coordination  WFL      Motor Speech   Overall Motor Speech  Impaired    Articulation  --   mildly labored   Motor Planning  Impaired    Level of Impairment  Phrase    Motor Speech Errors  Aware;Inconsistent    Effective Techniques  Slow rate    Phonation  [facility]                      SLP Education - 02/05/20 2151    Education Details  verbal apraxia vs. aphasia, tongue twisters for HEP    Person(s) Educated  Patient    Methods  Explanation;Demonstration;Verbal cues;Handout    Comprehension  Verbalized understanding;Returned demonstration;Verbal cues required;Need further instruction       SLP Short Term Goals - 02/05/20 2159      SLP SHORT TERM GOAL #1   Title  pt will complete cognitive testing PRN    Time  2    Period  Weeks    Status  New       SLP SHORT TERM GOAL #2   Title  pt will perfrom apraxia HEP with rare min A for slower rate over two sessions    Time  4    Period  Weeks   or 9 total sessions, for all remaining STGs   Status  New      SLP SHORT TERM GOAL #3   Title  pt will engage in simple-mod complex conversation for 10 minutes with modified independence    Time  4    Period  Weeks    Status  New      SLP SHORT TERM GOAL #4   Title  pt will read out loud WFL for 2 minutes with modified independence    Time  4    Period  Weeks    Status  New       SLP Long Term Goals - 02/05/20 2202      SLP LONG TERM GOAL #1   Title  pt will read out loud WFL for 3 minutes with modified independence    Time  8    Period  Weeks   or 17 total session   Status  New      SLP LONG TERM GOAL #2   Title  pt will report utilization of compensatory strategies for memory PRN with independence over 3 sessions    Time  8    Period  Weeks    Status  New      SLP LONG TERM GOAL #3   Title  pt will complete 10 minutes of simple to mod complex conversation with modified independence    Time  8    Period  Weeks    Status  New      SLP LONG TERM GOAL #4   Title  pt will report a higher QOL score than on visit 1    Time  [place] - 02/05/20 2153    Clinical Impression Statement  Pt presents today with  mild apraxia of speech and "brain fog" which pt provides example of forgetting procedural sequences. Pt no longer volunteers to read out loud which she did prior to COVID. Pt did not exhibit motor apraxia but did exhibit mild oral non-verbal apraxia. Does not report any difficulty with spelling or writing. Pt is more concerned about her speech than her impaired memory for previously learned abilities (pt gave example of making modifications to documents and graphics). Skilled ST is    Speech Therapy Frequency  2x / week   8 weeks or 17 total visits   Treatment/Interventions  Oral motor  exercises;Compensatory techniques;Functional tasks;SLP instruction and feedback;Cueing hierarchy;Cognitive reorganization;Internal/external aids;Patient/family education    Potential to Achieve Goals  Good    SLP Home Exercise Plan  provided today    Consulted and Agree with Plan of Care  Patient       Patient will benefit from skilled therapeutic intervention in order to improve the following deficits and impairments:   Verbal apraxia  Cognitive communication deficit    Problem List Patient Active Problem List   Diagnosis Date Noted  . History of COVID-19 12/15/2019  . Cough 12/15/2019  . Memory loss 12/15/2019  . Brain fog 12/15/2019  . Shortness of breath 12/15/2019  . Coronavirus infection 07/04/2019  . Presbycusis of both ears 10/20/2016  . Sialolithiasis of submandibular gland 10/20/2016  . Tinnitus aurium, 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  . Hypothyroidism 04/20/2016  . Overweight (BMI 25.0-29.9) 02/20/2016    Dejan Angert ,[place], CCC-SLP  02/05/2020, 10:13 PM  [facility] [address] [place], [place], [address] Phone: [phone]   Fax:  [phone]  Name: Annette Silva MRN: [id number] Date of Birth: [birth year]

## 2020-02-05 NOTE — Patient Instructions (Addendum)
Speech Exercises  Repeat each one 3  times, 2 times a day  Call the cat "Buttercup" A calendar of [place], [place] Four floors to cover Yellow oil ointment Fellow lovers of felines Catastrophe in [place]' plums The church's chimes chimed Telling time 'til eleven Five valve levers Keep the gate closed Go see that guy Fat cows give milk [employer] Gophers Fat frogs flip freely Kohl's into bed Get that game to [employer] Thick thistles stick together Cinnamon aluminum linoleum Black bugs blood Lovely lemon linament Red leather, yellow leather  Big grocery buggy    Purple baby carriage [facility] Proper copper coffee pot Ripe purple cabbage Three free throws [employer] tackled  [employer] dipped the dessert  [facility] [place] that [employer] of [employer] Shirts shrink, shells shouldn't [place] [employer]ers Take the tackle box File the flash message Give me five flapjacks Fundamental relatives Dye the pets purple Talking [place] time after time Dark chocolate chunks Political landscape of the kingdom Estate manager/land agent genius We played yo-yos yesterday  You know [place], you need [place], you know you need [place]  I saw a kitten eating chicken in the kitchen  If a dog chews shoes, whose shoes does he choose?  I thought I thought of thinking of thanking you  How can a clam cram in a clean cream can?  I scream, you scream, we all scream for ice cream  Susie works in a [place].   Can you can a can as a canner can can a can?  I have got a date at a quarter to eight; I'll see you at the gate, so don't be late

## 2020-02-08 ENCOUNTER — Ambulatory Visit: Admitting: Speech Pathology

## 2020-02-08 ENCOUNTER — Other Ambulatory Visit: Payer: Self-pay

## 2020-02-08 DIAGNOSIS — M6281 Muscle weakness (generalized): Secondary | ICD-10-CM | POA: Diagnosis not present

## 2020-02-08 DIAGNOSIS — R41841 Cognitive communication deficit: Secondary | ICD-10-CM

## 2020-02-08 DIAGNOSIS — R482 Apraxia: Secondary | ICD-10-CM

## 2020-02-08 NOTE — Therapy (Signed)
[facility] [address] [place], [place], [address] Phone: [phone]   Fax:  [phone]  Speech Language Pathology Treatment  Patient Details  Name: Annette Silva MRN: [id number] Date of Birth: [birth year] Referring Provider (SLP): Lazaro Arms NP   Encounter Date: 02/08/2020   End of Session - 02/08/20 1052    Visit Number 2    Number of Visits 17    Date for SLP Re-Evaluation 05/03/20   90 days   SLP Start Time 0803    SLP Stop Time  0845    SLP Time Calculation (min) 42 min    Activity Tolerance Patient tolerated treatment well           Past Medical History:  Diagnosis Date  . Allergy   . Anxiety   . COVID-19 virus infection 07/2019   hx of  . Depression   . Mini stroke ([place]) 5/ 2015  . Salivary stones   . Thyroid disease     Past Surgical History:  Procedure Laterality Date  . ABDOMINAL HYSTERECTOMY  11 / 1997   partial   . [place]  . TOOTH EXTRACTION Left     There were no vitals filed for this visit.   Subjective Assessment - 02/08/20 0806    Subjective "To be honest I can't remember."    Currently in Pain? Yes    Pain Score 7     Pain Location Wrist    Pain Orientation Right                 ADULT SLP TREATMENT - 02/08/20 1045      General Information   Behavior/Cognition Alert;Cooperative;Pleasant mood      Treatment Provided   Treatment provided Cognitive-Linquistic      Cognitive-Linquistic Treatment   Treatment focused on Apraxia;Aphasia    Skilled Treatment Patient reports wordfinding difficulties and hesitations in conversation, leading to frustration. Administered BNT 60 item test and pt score (51/60)is WNL for age, however she was noted to hesitate and self cue frequently through both gestures and description (hammock, door knocker, accordion). Most prominent were apraxic errors which pt mostly corrected (whistle, saw, helicopter, camel, harmonica,  stethascope). SLP notes inconsistent lateral lisp with /s/; patient denies this was present prior to her illness. Worked with pt on slowing rate with HEP; she feels she has to pause awkwardly when reading her Bible aloud. SLP requested pt bring this with her next session to habituate slower rate when reading. Discussed compensations for work and taking breaks to reduce cognitive fatigue.        Assessment / Recommendations / Plan   Plan Continue with current plan of care      Progression Toward Goals   Progression toward goals Progressing toward goals            SLP Education - 02/08/20 1052    Education Details take mental breaks at work in a quiet room or take a short walk    Person(s) Educated Patient    Methods Explanation    Comprehension Verbalized understanding            SLP Short Term Goals - 02/08/20 1055      SLP SHORT TERM GOAL #1   Title pt will complete cognitive testing PRN    Time 2    Period Weeks    Status On-going      SLP SHORT TERM GOAL #2   Title pt will perform apraxia  HEP with rare min A for slower rate over two sessions    Time 4    Period Weeks   or 9 total sessions, for all remaining STGs   Status On-going      SLP SHORT TERM GOAL #3   Title pt will engage in simple-mod complex conversation for 10 minutes with modified independence    Time 4    Period Weeks    Status On-going      SLP SHORT TERM GOAL #4   Title pt will read out loud WFL for 2 minutes with modified independence    Time 4    Period Weeks    Status On-going            SLP Long Term Goals - 02/08/20 1055      SLP LONG TERM GOAL #1   Title pt will read out loud WFL for 3 minutes with modified independence    Time 8    Period Weeks   or 17 total session   Status On-going      SLP LONG TERM GOAL #2   Title pt will report utilization of compensatory strategies for memory PRN with independence over 3 sessions    Time 8    Period Weeks    Status On-going      SLP LONG  TERM GOAL #3   Title pt will complete 10 minutes of simple to mod complex conversation with modified independence    Time 8    Period Weeks    Status On-going      SLP LONG TERM GOAL #4   Title pt will report a higher QOL score than on visit 1    Time 8    Period Weeks    Status On-going            Plan - 02/08/20 1053    Clinical Impression Statement 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 Pt read with consistent halting and hesitation in her speech, and stuttering of initial syllables was rarely heard in her conversational speech. Naming items pt had several apraxic errors (self-corrected). Pt no longer volunteers to read out loud which she did prior to COVID. Pt is more concerned about her speech than her impaired memory for previously learned abilities (pt gave example of making modifications to documents and graphics). Skilled ST is recommended to improve communication and cognition for work, home and social situations.    Speech Therapy Frequency 2x / week   8 weeks or 17 total visits   Treatment/Interventions Oral motor exercises;Compensatory techniques;Functional tasks;SLP instruction and feedback;Cueing hierarchy;Cognitive reorganization;Internal/external aids;Patient/family education    Potential to Achieve Goals Good    SLP Home Exercise Plan provided today    Consulted and Agree with Plan of Care Patient           Patient will benefit from skilled therapeutic intervention in order to improve the following deficits and impairments:   Verbal apraxia  Cognitive communication deficit    Problem List Patient Active Problem List   Diagnosis Date Noted  . History of COVID-19 12/15/2019  . Cough 12/15/2019  . Memory loss 12/15/2019  . Brain fog 12/15/2019  . Shortness of breath 12/15/2019  . Coronavirus infection 07/04/2019  . Presbycusis of both ears 10/20/2016  . Sialolithiasis of  submandibular gland 10/20/2016  . Tinnitus aurium, 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  . Hypothyroidism 04/20/2016  . Overweight (BMI 25.0-29.9) 02/20/2016  Deneise Lever, [place], CCC-SLP Speech-Language Pathologist   Aliene Altes 02/08/2020, 10:56 AM  [facility] [address] [place], [place], [address] Phone: [phone]   Fax:  [phone]   Name: Annette Silva MRN: [id number] Date of Birth: 21-Dec-1960

## 2020-02-08 NOTE — Patient Instructions (Signed)
Keep practicing the exercises Glendell Docker gave you. Remember to focus on a SLOW rate. You can bring your Bible with you for reading practice next time, or we can try printing something here.

## 2020-02-14 ENCOUNTER — Ambulatory Visit: Admitting: Speech Pathology

## 2020-02-15 ENCOUNTER — Ambulatory Visit: Admitting: Speech Pathology

## 2020-02-15 ENCOUNTER — Other Ambulatory Visit: Payer: Self-pay

## 2020-02-15 DIAGNOSIS — M6281 Muscle weakness (generalized): Secondary | ICD-10-CM | POA: Diagnosis not present

## 2020-02-15 DIAGNOSIS — R482 Apraxia: Secondary | ICD-10-CM

## 2020-02-15 NOTE — Therapy (Signed)
[facility] [address] [place], [place], [address] Phone: [phone]   Fax:  [phone]  Speech Language Pathology Treatment  Patient Details  Name: Annette Silva MRN: [id number] Date of Birth: 04-28-1961 Referring Provider (SLP): Lazaro Arms NP   Encounter Date: 02/15/2020   End of Session - 02/15/20 1549    Visit Number 3    Number of Visits 17    Date for SLP Re-Evaluation 05/03/20    SLP Start Time 1638    SLP Stop Time  1534    SLP Time Calculation (min) 41 min    Activity Tolerance Patient tolerated treatment well           Past Medical History:  Diagnosis Date  . Allergy   . Anxiety   . COVID-19 virus infection 07/2019   hx of  . Depression   . Mini stroke ([place]) 5/ 2015  . Salivary stones   . Thyroid disease     Past Surgical History:  Procedure Laterality Date  . ABDOMINAL HYSTERECTOMY  11 / 1997   partial   . [place]  . TOOTH EXTRACTION Left     There were no vitals filed for this visit.   Subjective Assessment - 02/15/20 1457    Subjective "To sum-thumb through..."    Currently in Pain? 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 SLP explained how feeling pressure to communicate effectively could contribute to communication breakdowns. Worked with patient on slowing rate in structured tasks, reading and simple conversation. Patient read selections from her Bible with initial mod-max A for slowing rate  (errors most prominent with word-final /s/). SLP used metronome app, modelled slow rate as well as finger tapping to habituate slower rhythm. Pt was most successful with this when reading from Psalms, using her bookmark to track line by line. She is to add this to her home practice.      Assessment / Recommendations / Plan   Plan Continue with current plan of care      Progression Toward Goals   Progression toward goals Progressing toward goals            SLP Education - 02/15/20 1547    Education Details strategies to slow rate when reading    Person(s) Educated Patient    Methods Explanation;Handout    Comprehension Verbalized understanding            SLP Short Term Goals - 02/15/20 1549      SLP SHORT TERM GOAL #1   Title pt will complete cognitive testing PRN    Time 1    Period Weeks    Status On-going      SLP SHORT TERM GOAL #2   Title pt will perform apraxia HEP with rare min A for slower rate over two sessions    Time 3    Period Weeks   or  9 total sessions, for all remaining STGs   Status On-going      SLP SHORT TERM GOAL #3   Title pt will engage in simple-mod complex conversation for 10 minutes with modified independence    Time 3    Period Weeks    Status On-going      SLP SHORT TERM GOAL #4   Title pt will read out loud WFL for 2 minutes with modified independence    Time 3    Period Weeks    Status On-going            SLP Long Term Goals - 02/15/20 1550      SLP LONG TERM GOAL #1   Title pt will read out loud WFL for 3 minutes with modified independence    Time 7    Period Weeks   or 17 total session   Status On-going      SLP LONG TERM GOAL #2   Title pt will report utilization of compensatory strategies for memory PRN with independence over 3 sessions    Time 7    Period Weeks    Status On-going      SLP LONG TERM GOAL #3   Title pt will complete 10 minutes of simple to mod complex conversation with modified independence    Time 7      Period Weeks    Status On-going      SLP LONG TERM GOAL #4   Title pt will report a higher QOL score than on visit 1    Time 7    Period Weeks    Status On-going            Plan - 02/15/20 1549    Clinical Impression Statement 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 Pt read with consistent halting and hesitation in her speech, and stuttering of initial syllables was rarely heard in her conversational speech. Naming items pt had several apraxic errors (self-corrected). Pt no longer volunteers to read out loud which she did prior to COVID. Pt is more concerned about her speech than her impaired memory for previously learned abilities (pt gave example of making modifications to documents and graphics). Skilled ST is recommended to improve communication and cognition for work, home and social situations.    Speech Therapy Frequency 2x / week   8 weeks or 17 total visits   Treatment/Interventions Oral motor exercises;Compensatory techniques;Functional tasks;SLP instruction and feedback;Cueing hierarchy;Cognitive reorganization;Internal/external aids;Patient/family education    Potential to Achieve Goals Good    SLP Home Exercise Plan provided today    Consulted and Agree with Plan of Care Patient           Patient will benefit from skilled therapeutic intervention in order to improve the following deficits and impairments:   Verbal apraxia    Problem List Patient Active Problem List   Diagnosis Date Noted  . History of COVID-19 12/15/2019  . Cough 12/15/2019  . Memory loss 12/15/2019  . Brain fog 12/15/2019  . Shortness of breath 12/15/2019  . Coronavirus infection 07/04/2019  . Presbycusis of both ears 10/20/2016  . Sialolithiasis of submandibular gland 10/20/2016  . Tinnitus aurium, 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  . Hypothyroidism 04/20/2016  . Overweight (BMI 25.0-29.9) 02/20/2016   Deneise Lever, [place], [place] 02/15/2020, 3:50 PM  [facility] [address] [place],  [place], [address] Phone: [phone]   Fax:  [phone]   Name: Annette Silva MRN: [id number] Date of Birth: [birth year]

## 2020-02-15 NOTE — Patient Instructions (Signed)
Practice reading [place]. You can do this after your other tongue twisters. Remember to do these slowly. Use your bookmark as a guide. You can tap your finger or your hand slowly before you start (maybe 1 beat per second) to help you pace yourself. If something comes out scrambled, start the sentence over and go slower.

## 2020-02-27 ENCOUNTER — Ambulatory Visit: Admitting: Speech Pathology

## 2020-02-29 ENCOUNTER — Other Ambulatory Visit: Payer: Self-pay

## 2020-02-29 ENCOUNTER — Ambulatory Visit: Attending: Nurse Practitioner | Admitting: Speech Pathology

## 2020-02-29 DIAGNOSIS — R482 Apraxia: Secondary | ICD-10-CM | POA: Insufficient documentation

## 2020-02-29 DIAGNOSIS — R41841 Cognitive communication deficit: Secondary | ICD-10-CM | POA: Diagnosis present

## 2020-02-29 NOTE — Therapy (Signed)
[facility] [address] [place], [place], [address] Phone: [phone]   Fax:  [phone]  Speech Language Pathology Treatment  Patient Details  Name: Annette Silva MRN: [id number] Date of Birth: [birth year] Referring Provider (SLP): Lazaro Arms NP   Encounter Date: 02/29/2020   End of Session - 02/29/20 1003    Visit Number 4    Number of Visits 17    Date for SLP Re-Evaluation 05/03/20    SLP Start Time 0810    SLP Stop Time  0847    SLP Time Calculation (min) 37 min    Activity Tolerance Patient tolerated treatment well           Past Medical History:  Diagnosis Date  . Allergy   . Anxiety   . COVID-19 virus infection 07/2019   hx of  . Depression   . Mini stroke ([place]) 5/ 2015  . Salivary stones   . Thyroid disease     Past Surgical History:  Procedure Laterality Date  . ABDOMINAL HYSTERECTOMY  11 / 1997   partial   . [place]  . TOOTH EXTRACTION Left     There were no vitals filed for this visit.   Subjective Assessment - 02/29/20 0958    Subjective "I read in church!"    Currently in Pain? No/denies                 ADULT SLP TREATMENT - 02/29/20 0959      General Information   Behavior/Cognition Alert;Cooperative;Pleasant mood      Treatment Provided   Treatment provided Cognitive-Linquistic      Pain Assessment   Pain Assessment No/denies pain      Cognitive-Linquistic Treatment   Treatment focused on Apraxia;Aphasia    Skilled Treatment Patient volunteered to read 4 verses in church: "My husband said it sounded OK." She continues to practice reading her Bible aloud with her husband. Patient discussed trouble saying words, and SLP inquired about difficulties with production vs wordfinding, and educated pt on these distinctions. She is experiencing both. SLP reviewed and provided handout with strategies to try when this occurs. Pt read from her Bible initially  without slowing rate, several apraxic errors. SLP had pt read again, tapping her finger twice at each comma or period. Pt then read again without taps, but slight pauses at the "pause opportunities" she identified. She is to continue practicing this way at home. Suggested pt could arrange with her pastor to plan a reading that she could practice beforehand.       Assessment / Recommendations / Plan   Plan Continue with current plan of care      Progression Toward Goals   Progression toward goals Progressing toward goals            SLP Education - 02/29/20 1003    Education Details compensations for anomia and apraxia, pause/slow rate when reading to improve accuracy    Person(s) Educated Patient    Methods Explanation;Handout;Demonstration;Tactile cues;Verbal cues    Comprehension Verbalized understanding;Returned demonstration;Verbal cues required;Tactile cues required            SLP Short Term Goals - 02/29/20 1006      SLP SHORT TERM GOAL #1   Title pt will complete cognitive testing PRN    Time 1    Period Weeks    Status Deferred      SLP SHORT TERM GOAL #2   Title pt will perform  apraxia HEP with rare min A for slower rate over two sessions    Time 2    Period Weeks   or 9 total sessions, for all remaining STGs   Status On-going      SLP SHORT TERM GOAL #3   Title pt will engage in simple-mod complex conversation for 10 minutes with modified independence    Time 2    Period Weeks    Status On-going      SLP SHORT TERM GOAL #4   Title pt will read out loud [facility] for 2 minutes with modified independence    Baseline 02/29/20    Time 2    Period Weeks    Status Achieved            SLP Long Term Goals - 02/29/20 1006      SLP LONG TERM GOAL #1   Title pt will read out loud WFL for 3 minutes with modified independence    Time 6    Period Weeks   or 17 total session   Status On-going      SLP LONG TERM GOAL #2   Title pt will report utilization of compensatory  strategies for memory PRN with independence over 3 sessions    Time 6    Period Weeks    Status On-going      SLP LONG TERM GOAL #3   Title pt will complete 10 minutes of simple to mod complex conversation with modified independence    Time 6    Period Weeks    Status On-going      SLP LONG TERM GOAL #4   Title pt will report a higher QOL score than on visit 1    Time 6    Period Weeks    Status On-going            Plan - 02/29/20 1003    Clinical Impression Statement 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 Patient reports she read a short passage aloud in church and is slowly gaining confidence. She still struggles with halting speech, wordfinding. Reviewed strategies for anomia, apraxia today. Tactile self-cuing (finger tapping) helpful for pt in IDing opportunities to pause, slow rate when reading. Pt is more concerned about her speech than her impaired memory for previously learned abilities (pt gave example of making modifications to documents and graphics). Skilled ST is recommended to improve communication and cognition for work, home and social situations.    Speech Therapy Frequency 2x / week   8 weeks or 17 total visits   Treatment/Interventions Oral motor exercises;Compensatory techniques;Functional tasks;SLP instruction and feedback;Cueing hierarchy;Cognitive reorganization;Internal/external aids;Patient/family education    Potential to Achieve Goals Good    SLP Home Exercise Plan provided today    Consulted and Agree with Plan of Care Patient           Patient will benefit from skilled therapeutic intervention in order to improve the following deficits and impairments:   Verbal apraxia  Cognitive communication deficit    Problem List Patient Active Problem List   Diagnosis Date Noted  . History of COVID-19 12/15/2019  . Cough 12/15/2019  . Memory loss 12/15/2019  . Brain fog  12/15/2019  . Shortness of breath 12/15/2019  . Coronavirus infection 07/04/2019  . Presbycusis of both ears 10/20/2016  . Sialolithiasis of submandibular gland 10/20/2016  . Tinnitus aurium, 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  . Hypothyroidism 04/20/2016  .  Overweight (BMI 25.0-29.9) 02/20/2016   Deneise Lever, [place], [place] 02/29/2020, 10:07 AM  [facility] [address] [place], [place], [address] Phone: [phone]   Fax:  [phone]   Name: Elleigh Cassetta MRN: [id number] Date of Birth: 1961-06-16

## 2020-02-29 NOTE — Patient Instructions (Signed)
Motor speech (I know the word, but my muscles can't figure out how to say it)  - Try writing it first - Try saying another word that sounds like it  Wordfinding (I know the idea of what I want to say, but the word won't come)  -Gestures can help -Describe it: size, what is it made of, where would you find it, what is it used for, what does it remind you of? -Use a different word  Reading out loud: strategy to slow your pace  - Read once through, tap your finger twice for each pause opportunity (pay attention to commas and periods) -Read again without taps, but with a slight hesitation at the pause opportunities you identified  -Consider asking your pastor if there is a passage he would like you to read and let him know it might help you to practice it in advance.

## 2020-03-05 ENCOUNTER — Other Ambulatory Visit: Payer: Self-pay

## 2020-03-05 ENCOUNTER — Ambulatory Visit: Admitting: Speech Pathology

## 2020-03-05 DIAGNOSIS — R482 Apraxia: Secondary | ICD-10-CM

## 2020-03-05 DIAGNOSIS — R41841 Cognitive communication deficit: Secondary | ICD-10-CM

## 2020-03-05 NOTE — Therapy (Signed)
[facility] [address] [place], [place], [address] Phone: [phone]   Fax:  [phone]  Speech Language Pathology Treatment  Patient Details  Name: Annette Silva MRN: [id number] Date of Birth: 1961/02/14 Referring Provider (SLP): Lazaro Arms NP   Encounter Date: 03/05/2020   End of Session - 03/05/20 1050    Visit Number 5    Number of Visits 17    Date for SLP Re-Evaluation 05/03/20    SLP Start Time 0848    SLP Stop Time  0930    SLP Time Calculation (min) 42 min    Activity Tolerance Patient tolerated treatment well           Past Medical History:  Diagnosis Date  . Allergy   . Anxiety   . COVID-19 virus infection 07/2019   hx of  . Depression   . Mini stroke ([place]) 5/ 2015  . Salivary stones   . Thyroid disease     Past Surgical History:  Procedure Laterality Date  . ABDOMINAL HYSTERECTOMY  11 / 1997   partial   . [place]  . TOOTH EXTRACTION Left     There were no vitals filed for this visit.   Subjective Assessment - 03/05/20 0849    Subjective "I'm practicing the tips you've given me for reading."    Currently in Pain? No/denies                 ADULT SLP TREATMENT - 03/05/20 0859      General Information   Behavior/Cognition Alert;Cooperative;Pleasant mood      Treatment Provided   Treatment provided Cognitive-Linquistic      Cognitive-Linquistic Treatment   Treatment focused on Aphasia;Apraxia    Skilled Treatment Patient reported using compensations for reading (using handout given previous session) but admitted to difficulties using strategies with wordfinding. SLP used verbal expression activity with patient today to facilitate use of anomia compensations. Patient provided verbal descriptions, gestures,  with occasional min-mod A fading to rare min A, referencing whiteboard with strategies written. Patient reports she will ask for extra time with her  husband to practice this at home instead of allowing him to "give me the word." She reports using taps to help ID pause opportunities when reading her Bible with her husband, and he noticed her reading was slower and more accurate. Today she required mod A initially to pause when tapping while reading from Psalms, fading to modified independence.       Assessment / Recommendations / Plan   Plan Continue with current plan of care      Progression Toward Goals   Progression toward goals Progressing toward goals            SLP Education - 03/05/20 1050    Education Details compensations for anomia    Person(s) Educated Patient    Methods Explanation    Comprehension Verbalized understanding            SLP Short Term Goals - 03/05/20 1051      SLP SHORT TERM GOAL #1   Title pt will complete cognitive testing PRN    Time 1    Period Weeks    Status Deferred      SLP SHORT TERM GOAL #2   Title pt will perform apraxia HEP with rare min A for slower rate over two sessions    Time 1    Period Weeks   or 9 total sessions, for  all remaining STGs   Status On-going      SLP SHORT TERM GOAL #3   Title pt will engage in simple-mod complex conversation for 10 minutes with modified independence    Time 1    Period Weeks    Status On-going      SLP SHORT TERM GOAL #4   Title pt will read out loud [facility] for 2 minutes with modified independence    Baseline 02/29/20    Time 2    Period Weeks    Status Achieved            SLP Long Term Goals - 03/05/20 1051      SLP LONG TERM GOAL #1   Title pt will read out loud WFL for 3 minutes with modified independence    Time 5    Period Weeks   or 17 total session   Status On-going      SLP LONG TERM GOAL #2   Title pt will report utilization of compensatory strategies for memory PRN with independence over 3 sessions    Time 5    Period Weeks    Status On-going      SLP LONG TERM GOAL #3   Title pt will complete 10 minutes of simple  to mod complex conversation with modified independence    Time 5    Period Weeks    Status On-going      SLP LONG TERM GOAL #4   Title pt will report a higher QOL score than on visit 1    Time 5    Period Weeks    Status On-going            Plan - 03/05/20 1050    Clinical Impression Statement 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 Patient reports she read a short passage aloud in church and is slowly gaining confidence. She still struggles with halting speech, wordfinding. Reviewed strategies for anomia, apraxia today. Tactile self-cuing (finger tapping) helpful for pt in IDing opportunities to pause, slow rate when reading. Pt is more concerned about her speech than her impaired memory for previously learned abilities (pt gave example of making modifications to documents and graphics). Skilled ST is recommended to improve communication and cognition for work, home and social situations.    Speech Therapy Frequency 2x / week   8 weeks or 17 total visits   Treatment/Interventions Oral motor exercises;Compensatory techniques;Functional tasks;SLP instruction and feedback;Cueing hierarchy;Cognitive reorganization;Internal/external aids;Patient/family education    Potential to Achieve Goals Good    SLP Home Exercise Plan provided today    Consulted and Agree with Plan of Care Patient           Patient will benefit from skilled therapeutic intervention in order to improve the following deficits and impairments:   Verbal apraxia  Cognitive communication deficit    Problem List Patient Active Problem List   Diagnosis Date Noted  . History of COVID-19 12/15/2019  . Cough 12/15/2019  . Memory loss 12/15/2019  . Brain fog 12/15/2019  . Shortness of breath 12/15/2019  . Coronavirus infection 07/04/2019  . Presbycusis of both ears 10/20/2016  . Sialolithiasis of submandibular gland 10/20/2016  .  Tinnitus aurium, 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  . Hypothyroidism 04/20/2016  . Overweight (BMI 25.0-29.9) 02/20/2016   Deneise Lever, [place], [place] 03/05/2020, 10:52 AM  [facility] [address]  [place], [place], [address] Phone: [phone]   Fax:  [phone]   Name: Annette Silva MRN: [id number] Date of Birth: [birth year]

## 2020-03-05 NOTE — Patient Instructions (Signed)
When you find yourself in conversation with your husband forgetting a word, ask him to give you extra time and let you try to come up with it. Remember those strategies we practiced: description, use another word, think about physical characteristics, location, use, etc.    Check out Taboo (board game) or Heads Up (app on your phone)

## 2020-03-08 ENCOUNTER — Ambulatory Visit
Admission: RE | Admit: 2020-03-08 | Discharge: 2020-03-08 | Disposition: A | Discharge status: Home / Self Care | Source: Ambulatory Visit | Attending: Family Medicine | Admitting: Family Medicine

## 2020-03-08 ENCOUNTER — Other Ambulatory Visit: Payer: Self-pay

## 2020-03-08 ENCOUNTER — Other Ambulatory Visit: Payer: Self-pay | Admitting: Family Medicine

## 2020-03-08 DIAGNOSIS — N644 Mastodynia: Secondary | ICD-10-CM

## 2020-03-08 DIAGNOSIS — Z1231 Encounter for screening mammogram for malignant neoplasm of breast: Secondary | ICD-10-CM

## 2020-03-11 ENCOUNTER — Ambulatory Visit

## 2020-03-11 ENCOUNTER — Other Ambulatory Visit: Payer: Self-pay

## 2020-03-11 DIAGNOSIS — R41841 Cognitive communication deficit: Secondary | ICD-10-CM

## 2020-03-11 DIAGNOSIS — R482 Apraxia: Secondary | ICD-10-CM | POA: Diagnosis not present

## 2020-03-11 NOTE — Therapy (Signed)
[facility] [address] [place], [place], [address] Phone: [phone]   Fax:  [phone]  Speech Language Pathology Treatment  Patient Details  Name: Annette Silva MRN: [id number] Date of Birth: [birth year] Referring Provider (SLP): Lazaro Arms NP   Encounter Date: 03/11/2020   End of Session - 03/11/20 0912    Visit Number 6    Number of Visits 17    Date for SLP Re-Evaluation 05/03/20    SLP Start Time 0806    SLP Stop Time  0846    SLP Time Calculation (min) 40 min    Activity Tolerance Patient tolerated treatment well           Past Medical History:  Diagnosis Date  . Allergy   . Anxiety   . COVID-19 virus infection 07/2019   hx of  . Depression   . Mini stroke ([place]) 5/ 2015  . Salivary stones   . Thyroid disease     Past Surgical History:  Procedure Laterality Date  . ABDOMINAL HYSTERECTOMY  11 / 1997   partial   . [place]  . TOOTH EXTRACTION Left     There were no vitals filed for this visit.   Subjective Assessment - 03/11/20 0816    Subjective Pt reports poor sleep last night. "I think (not starting out tapping) is a memory problem. Without (the [place]) I don't know how I would cope. (tearful)"    Currently in Pain? Yes    Pain Location Scapula   "under the scapula"   Pain Orientation Left    Pain Descriptors / Indicators Sore    Pain Type Acute pain    Pain Onset In the past 7 days                 ADULT SLP TREATMENT - 03/11/20 0828      General Information   Behavior/Cognition Alert;Cooperative;Pleasant mood      Treatment Provided   Treatment provided Cognitive-Linquistic      Cognitive-Linquistic Treatment   Treatment focused on Aphasia;Apraxia;Cognition    Skilled Treatment Cognitive (15 minutes): SLP and pt discussed strategies to enable pt to recall to tap when she reads, and why tapping is helpful.(speech tx-ind) SLP engaged pt in mod  complex-complex conversation for 22 minutes and pt had 3 anomic episodes which she compensataed for successfully. Homework to describe flowers in her flower garden as well as worksheet to describe by attribute, adn reading out loud as often as she can using the yellow sticky  note. Tearful (brief) throughout session today.       Assessment / Recommendations / Plan   Plan Continue with current plan of care      Progression Toward Goals   Progression toward goals Progressing toward goals            SLP Education - 03/11/20 0911    Education Details why reading out loud is important    Person(s) Educated Patient    Methods Explanation    Comprehension Verbalized understanding            SLP Short Term Goals - 03/05/20 1051      SLP SHORT TERM GOAL #1   Title pt will complete cognitive testing PRN    Time 1    Period Weeks    Status Deferred      SLP SHORT TERM GOAL #2   Title pt will perform apraxia HEP with rare min A for  slower rate over two sessions    Time 1    Period Weeks   or 9 total sessions, for all remaining STGs   Status On-going      SLP SHORT TERM GOAL #3   Title pt will engage in simple-mod complex conversation for 10 minutes with modified independence    Time 1    Period Weeks    Status On-going      SLP SHORT TERM GOAL #4   Title pt will read out loud [facility] for 2 minutes with modified independence    Baseline 02/29/20    Time 2    Period Weeks    Status Achieved            SLP Long Term Goals - 03/11/20 0915      SLP LONG TERM GOAL #1   Title pt will read out loud WFL for 3 minutes with modified independence    Time 4    Period Weeks   or 17 total session   Status On-going      SLP LONG TERM GOAL #2   Title pt will report utilization of compensatory strategies for memory PRN with independence over 3 sessions    Time 4    Period Weeks    Status On-going      SLP LONG TERM GOAL #3   Title pt will complete 10 minutes of simple to mod complex  conversation with modified independence    Baseline 03-11-20    Time 4    Period Weeks    Status On-going      SLP LONG TERM GOAL #4   Title pt will report a higher QOL score than on visit 1    Time 4    Period Weeks    Status On-going            Plan - 03/11/20 0912    Clinical Impression Statement 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 Patient reports she reads passages each day but today deferred to her husband - SLP encourgaed pt to have read a portion of the passage instead. Speech today was rather fluent given the complexity of topics, however pt cont to struggle with wordfinding and used compensatory strategies successfully with extra time. Reviewed strategies for anomia, apraxia today. Tactile self-cuing (finger tapping) helpful for pt in IDing opportunities to pause, slow rate when reading. Skilled ST continues recommended to improve communication and cognition for work, home and social situations.    Speech Therapy Frequency 2x / week   8 weeks or 17 total visits   Treatment/Interventions Oral motor exercises;Compensatory techniques;Functional tasks;SLP instruction and feedback;Cueing hierarchy;Cognitive reorganization;Internal/external aids;Patient/family education    Potential to Achieve Goals Good    SLP Home Exercise Plan provided today    Consulted and Agree with Plan of Care Patient           Patient will benefit from skilled therapeutic intervention in order to improve the following deficits and impairments:   Verbal apraxia  Cognitive communication deficit    Problem List Patient Active Problem List   Diagnosis Date Noted  . History of COVID-19 12/15/2019  . Cough 12/15/2019  . Memory loss 12/15/2019  . Brain fog 12/15/2019  . Shortness of breath 12/15/2019  . Coronavirus infection 07/04/2019  . Presbycusis of both ears 10/20/2016  . Sialolithiasis of submandibular gland  10/20/2016  . Tinnitus aurium, 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  . Hypothyroidism 04/20/2016  .  Overweight (BMI 25.0-29.9) 02/20/2016    Hosie Sharman ,MS, CCC-SLP  03/11/2020, 9:16 AM  [facility] [address] [place], [place], [address] Phone: [phone]   Fax:  [phone]   Name: Janeane Cozart MRN: [id number] Date of Birth: [birth year]

## 2020-03-11 NOTE — Patient Instructions (Signed)
  Please complete the assigned speech therapy homework prior to your next session and return it to the speech therapist at your next visit.  

## 2020-03-18 ENCOUNTER — Ambulatory Visit

## 2020-03-21 ENCOUNTER — Ambulatory Visit
Admission: RE | Admit: 2020-03-21 | Discharge: 2020-03-21 | Disposition: A | Discharge status: Home / Self Care | Source: Ambulatory Visit | Attending: Family Medicine | Admitting: Family Medicine

## 2020-03-21 ENCOUNTER — Other Ambulatory Visit: Payer: Self-pay

## 2020-03-21 DIAGNOSIS — N644 Mastodynia: Secondary | ICD-10-CM

## 2020-03-25 ENCOUNTER — Ambulatory Visit

## 2020-03-25 ENCOUNTER — Other Ambulatory Visit: Payer: Self-pay

## 2020-03-25 DIAGNOSIS — R41841 Cognitive communication deficit: Secondary | ICD-10-CM

## 2020-03-25 DIAGNOSIS — R482 Apraxia: Secondary | ICD-10-CM | POA: Diagnosis not present

## 2020-03-25 NOTE — Therapy (Signed)
[facility] [address] [place], [place], [address] Phone: [phone]   Fax:  [phone]  Speech Language Pathology Treatment  Patient Details  Name: Annette Silva MRN: [id number] Date of Birth: 1960/12/12 Referring Provider (SLP): Lazaro Arms NP   Encounter Date: 03/25/2020   End of Session - 03/25/20 1002    Visit Number 7    Number of Visits 17    Date for SLP Re-Evaluation 05/03/20    SLP Start Time 0805    SLP Stop Time  0846    SLP Time Calculation (min) 41 min    Activity Tolerance Patient tolerated treatment well           Past Medical History:  Diagnosis Date  . Allergy   . Anxiety   . COVID-19 virus infection 07/2019   hx of  . Depression   . Mini stroke ([place]) 5/ 2015  . Salivary stones   . Thyroid disease     Past Surgical History:  Procedure Laterality Date  . ABDOMINAL HYSTERECTOMY  11 / 1997   partial   . [place]  . TOOTH EXTRACTION Left     There were no vitals filed for this visit.   Subjective Assessment - 03/25/20 0807    Subjective "I volunteered to help in [employer] - and I worked during the day. I learned I wasn't ready to do that."  "I don't have to tap anymore."                 ADULT SLP TREATMENT - 03/25/20 0812      General Information   Behavior/Cognition Alert;Cooperative;Pleasant mood      Treatment Provided   Treatment provided Cognitive-Linquistic      Cognitive-Linquistic Treatment   Treatment focused on Aphasia;Apraxia;Cognition    Skilled Treatment "I found out that when I'm tired like in the evening, (my talking) is worse." SLP talked through what strategies would be good for pt to use on days when she knows she has something to talk for later on in evening/night. SLP had pt describe in detail her flower garden - pt provided excellent description with 2 anomic events. SLP encouraged pt that her talking is more fluid than previous  session with SLP - suggested she make audio recording of her talking to track her progress, as pt stated nothing had changed with her speech since last session Pt to bring a picture of her flower garden for next ST session.       Assessment / Recommendations / Plan   Plan Continue with current plan of care      Progression Toward Goals   Progression toward goals Progressing toward goals            SLP Education - 03/25/20 1000    Education Details keep an audio log of her progress, compensations for fatigue in PMs    Person(s) Educated Patient    Methods Explanation    Comprehension Verbalized understanding            SLP Short Term Goals - 03/25/20 1005      SLP SHORT TERM GOAL #1   Title pt will complete cognitive testing PRN    Time 1    Period Weeks    Status Deferred      SLP SHORT TERM GOAL #2   Title pt will perform apraxia HEP with rare min A for slower rate over two sessions    Time 1  Period Weeks   or 9 total sessions, for all remaining STGs   Status Achieved      SLP SHORT TERM GOAL #3   Title pt will engage in simple-mod complex conversation for 10 minutes with modified independence    Time 1    Period Weeks    Status Achieved      SLP SHORT TERM GOAL #4   Title pt will read out loud [facility] for 2 minutes with modified independence    Baseline 02/29/20    Time 2    Period Weeks    Status Achieved            SLP Long Term Goals - 03/25/20 1005      SLP LONG TERM GOAL #1   Title pt will read out loud WFL for 3 minutes with modified independence    Time 3    Period Weeks   or 17 total session   Status On-going      SLP LONG TERM GOAL #2   Title pt will report utilization of compensatory strategies for memory PRN with independence over 3 sessions    Time 3    Period Weeks    Status On-going      SLP LONG TERM GOAL #3   Title pt will complete 10 minutes of mod complex-complex conversation with modified independence    Baseline 03-11-20    Time  3    Period Weeks    Status Revised      SLP LONG TERM GOAL #4   Title pt will report a higher QOL score than on visit 1    Time 3    Period Weeks    Status On-going            Plan - 03/25/20 1002    Clinical Impression Statement 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 Speech today continued even more fluent than previous session given the complexity of topics; SP congratulated pt on her improvement from last session. Pt used anomia compensatory strategies successfully with extra time (description). Skilled ST continues recommended to improve communication and cognition for work, home and social situations.    Speech Therapy Frequency 2x / week   8 weeks or 17 total visits   Treatment/Interventions Oral motor exercises;Compensatory techniques;Functional tasks;SLP instruction and feedback;Cueing hierarchy;Cognitive reorganization;Internal/external aids;Patient/family education    Potential to Achieve Goals Good    SLP Home Exercise Plan provided today    Consulted and Agree with Plan of Care Patient           Patient will benefit from skilled therapeutic intervention in order to improve the following deficits and impairments:   Verbal apraxia  Cognitive communication deficit    Problem List Patient Active Problem List   Diagnosis Date Noted  . History of COVID-19 12/15/2019  . Cough 12/15/2019  . Memory loss 12/15/2019  . Brain fog 12/15/2019  . Shortness of breath 12/15/2019  . Coronavirus infection 07/04/2019  . Presbycusis of both ears 10/20/2016  . Sialolithiasis of submandibular gland 10/20/2016  . Tinnitus aurium, 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  . Hypothyroidism 04/20/2016  . Overweight (BMI 25.0-29.9) 02/20/2016    Zariyah Stephens ,[place], CCC-SLP  03/25/2020, 10:06 AM  [facility] [address] [place], [place], [address] Phone: [phone]   Fax:  [phone]   Name: Rebacca Votaw MRN: [id number] Date of Birth: [birth year]

## 2020-03-26 ENCOUNTER — Telehealth: Payer: Self-pay | Admitting: Family Medicine

## 2020-03-26 NOTE — Telephone Encounter (Signed)
Pt was contacted to resched 9/10 appt

## 2020-04-09 ENCOUNTER — Ambulatory Visit: Attending: Nurse Practitioner

## 2020-04-09 ENCOUNTER — Other Ambulatory Visit: Payer: Self-pay

## 2020-04-09 DIAGNOSIS — R41841 Cognitive communication deficit: Secondary | ICD-10-CM

## 2020-04-09 DIAGNOSIS — R482 Apraxia: Secondary | ICD-10-CM | POA: Diagnosis not present

## 2020-04-09 NOTE — Therapy (Signed)
[facility] [address] [place], [place], [address] Phone: [phone]   Fax:  [phone]  Speech Language Pathology Treatment  Patient Details  Name: Annette Silva MRN: [id number] Date of Birth: [birth year] Referring Provider (SLP): Lazaro Arms NP   Encounter Date: 04/09/2020   End of Session - 04/09/20 1310    Visit Number 8    Number of Visits 17    Date for SLP Re-Evaluation 05/03/20    SLP Start Time 0804    SLP Stop Time  0845    SLP Time Calculation (min) 41 min    Activity Tolerance Patient tolerated treatment well           Past Medical History:  Diagnosis Date  . Allergy   . Anxiety   . COVID-19 virus infection 07/2019   hx of  . Depression   . Mini stroke ([place]) 5/ 2015  . Salivary stones   . Thyroid disease     Past Surgical History:  Procedure Laterality Date  . ABDOMINAL HYSTERECTOMY  11 / 1997   partial   . [place]  . TOOTH EXTRACTION Left     There were no vitals filed for this visit.          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 Pt compained of rare skipping of a line when she reads out loud - she uses a bookmark to keep her place - SLP suggested pt could also use a colored pen to keep place as well.  Pt cont to compensate well for mild memory difficulties at home, she reports.       Assessment / Recommendations / Plan   Plan Other (Comment)   remain at once every other week     Progression Toward Goals   Progression toward goals  Progressing toward goals              SLP Short Term Goals - 03/25/20 1005      SLP SHORT TERM GOAL #1   Title pt will complete cognitive testing PRN    Time 1    Period Weeks    Status Deferred      SLP SHORT TERM GOAL #2   Title pt will perform apraxia HEP with rare min A for slower rate over two sessions    Time 1    Period Weeks   or 9 total sessions, for all remaining STGs   Status Achieved      SLP SHORT TERM GOAL #3   Title pt will engage in simple-mod complex conversation for 10 minutes with modified independence    Time 1    Period Weeks    Status Achieved      SLP SHORT TERM GOAL #4   Title pt will read out loud [facility] for 2 minutes with modified independence    Baseline 02/29/20    Time 2    Period Weeks    Status Achieved            SLP Long Term Goals -  04/09/20 0809      SLP LONG TERM GOAL #1   Title pt will read out loud WFL for 3 minutes with modified independence    Time 2    Period Weeks   or 17 total session   Status On-going      SLP LONG TERM GOAL #2   Title pt will report utilization of compensatory strategies for memory PRN with independence over 3 sessions    Baseline 04-09-20    Time 2    Period Weeks    Status On-going      SLP LONG TERM GOAL #3   Title pt will complete 10 minutes of mod complex-complex conversation with modified independence x2 sessions    Baseline 03-11-20    Time 2    Period Weeks    Status Revised      SLP LONG TERM GOAL #4   Title pt will report a higher QOL score than on visit 1    Time 3    Period Weeks    Status On-going            Plan - 04/09/20 1310    Clinical Impression Statement Pt presents today with minimal/no evidence of apraxia of speech; In conversation, once, pt used anomia compensatory strategies in one opportunity successfully. Skilled ST continues recommended, at once every other week (possible d/c next session), to improve communication and cognition for work, home and social  situations.    Speech Therapy Frequency --   8 weeks or 17 total visits   Duration --   8 weeks or 17 total sessions   Treatment/Interventions Oral motor exercises;Compensatory techniques;Functional tasks;SLP instruction and feedback;Cueing hierarchy;Cognitive reorganization;Internal/external aids;Patient/family education    Potential to Achieve Goals Good    Consulted and Agree with Plan of Care Patient           Patient will benefit from skilled therapeutic intervention in order to improve the following deficits and impairments:   Verbal apraxia  Cognitive communication deficit    Problem List Patient Active Problem List   Diagnosis Date Noted  . History of COVID-19 12/15/2019  . Cough 12/15/2019  . Memory loss 12/15/2019  . Brain fog 12/15/2019  . Shortness of breath 12/15/2019  . Coronavirus infection 07/04/2019  . Presbycusis of both ears 10/20/2016  . Sialolithiasis of submandibular gland 10/20/2016  . Tinnitus aurium, 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  . Hypothyroidism 04/20/2016  . Overweight (BMI 25.0-29.9) 02/20/2016    Annette Silva ,MS, CCC-SLP  04/09/2020, 1:15 PM  [facility] [address] [place], [place], [address] Phone: [phone]   Fax:  [phone]   Name: Annette Silva MRN: [id number] Date of Birth: Jan 04, 1961

## 2020-04-15 ENCOUNTER — Ambulatory Visit

## 2020-04-16 ENCOUNTER — Telehealth: Payer: Self-pay | Admitting: Family Medicine

## 2020-04-16 DIAGNOSIS — E039 Hypothyroidism, unspecified: Secondary | ICD-10-CM

## 2020-04-16 MED ORDER — VALACYCLOVIR HCL 1 G PO TABS
1000.0000 mg | ORAL_TABLET | Freq: Three times a day (TID) | ORAL | 0 refills | Status: DC
Start: 2020-04-16 — End: 2020-05-03

## 2020-04-16 MED ORDER — LEVOTHYROXINE SODIUM 75 MCG PO TABS: 75.0000 ug | ORAL_TABLET | Freq: Every day | ORAL | 0 refills | Status: DC

## 2020-04-16 NOTE — Telephone Encounter (Signed)
Refills sent to pharmacy and appt made for follow up.  Patient aware that she will need to keep this appointment for any further refills

## 2020-04-16 NOTE — Telephone Encounter (Signed)
°  Prescription Request  04/16/2020  What is the name of the medication or equipment? rx for shingles and llevothyroxine (SYNTHROID) 75 MCG tablet  Have you contacted your pharmacy to request a refill? (if applicable) no  Which pharmacy would you like this sent to? [facility]   Patient notified that their request is being sent to the clinical staff for review and that they should receive a response within 2 business days.

## 2020-04-24 ENCOUNTER — Ambulatory Visit

## 2020-05-03 ENCOUNTER — Encounter: Payer: Self-pay | Admitting: Family Medicine

## 2020-05-03 ENCOUNTER — Ambulatory Visit (INDEPENDENT_AMBULATORY_CARE_PROVIDER_SITE_OTHER): Admitting: Family Medicine

## 2020-05-03 ENCOUNTER — Other Ambulatory Visit: Payer: Self-pay

## 2020-05-03 VITALS — BP 104/66 | HR 72 | Temp 97.9°F | Ht 66.0 in | Wt 156.0 lb

## 2020-05-03 DIAGNOSIS — K2100 Gastro-esophageal reflux disease with esophagitis, without bleeding: Secondary | ICD-10-CM | POA: Diagnosis not present

## 2020-05-03 DIAGNOSIS — E039 Hypothyroidism, unspecified: Secondary | ICD-10-CM

## 2020-05-03 MED ORDER — LEVOTHYROXINE SODIUM 75 MCG PO TABS: 75.0000 ug | ORAL_TABLET | Freq: Every day | ORAL | 1 refills | Status: DC

## 2020-05-03 MED ORDER — OMEPRAZOLE 40 MG PO CPDR: 40.0000 mg | DELAYED_RELEASE_CAPSULE | Freq: Every day | ORAL | 1 refills | Status: DC

## 2020-05-03 NOTE — Progress Notes (Signed)
Subjective:  Patient ID: Annette Silva, female    DOB: 05/04/1961  Age: 59 y.o. MRN: [id number]  CC: Follow-up (3 month)   HPI Annette Silva presents for  follow-up on  thyroid. The patient has a history of hypothyroidism for many years. It has been stable recently. Pt. denies any change in  voice, loss of hair, heat or cold intolerance. Energy level has beenfair. Improved overall  Patient denies constipation and diarrhea. No myxedema. Medication is as noted below. Verified that pt is taking it daily on an empty stomach. Well tolerated.  Pt. Going to long [facility] clinic. OVerall much better.  They discovered that she had reflux.  She was describing shortness of breath and chest heaviness.  After ruling out Covid related issues she was treated with Prilosec and her symptoms have gotten better.  However, she does have to be careful with the food she eats.  She has been taking the omeprazole and the Synthroid together since both required an empty stomach.     Depression screen [facility] 2/9 05/03/2020 12/15/2019 12/13/2019  Decreased Interest 0 0 0  Down, Depressed, Hopeless 0 0 1  PHQ - 2 Score 0 0 1  Altered sleeping - - -  Tired, decreased energy - - -  Change in appetite - - -  Feeling bad or failure about yourself  - - -  Trouble concentrating - - -  Moving slowly or fidgety/restless - - -  Suicidal thoughts - - -  PHQ-9 Score - - -  Difficult doing work/chores - - -    History Annette Silva has a past medical history of Allergy, Anxiety, COVID-19 virus infection (07/2019), Depression, Mini stroke ([place]) (5/ 2015), Salivary stones, and Thyroid disease.   She has a past surgical history that includes Abdominal hysterectomy (11 / 1997); Cesarean section (1993); and Tooth extraction (Left).   Her family history includes Arthritis in her mother; COPD in her mother; Cancer in her father; Depression in her mother; Diabetes in her mother; Hearing loss in her mother; Heart disease in her mother;  Hypertension in her mother; Stroke in her mother.She reports that she has never smoked. She has never used smokeless tobacco. She reports current alcohol use. She reports that she does not use drugs.    ROS Review of Systems  Constitutional: Positive for fatigue (still feels like napping after work).  HENT: Negative for congestion.   Eyes: Negative for visual disturbance.  Respiratory: Negative for shortness of breath.   Cardiovascular: Negative for chest pain.  Gastrointestinal: Negative for abdominal pain, constipation, diarrhea, nausea and vomiting.  Genitourinary: Negative for difficulty urinating.  Musculoskeletal: Negative for arthralgias and myalgias.  Neurological: Positive for numbness (ankles go to sleep occasionally). Negative for headaches.  Psychiatric/Behavioral: Negative for sleep disturbance.    Objective:  BP 104/66   Pulse 72   Temp 97.9 F (36.6 C) (Temporal)   Ht 5' 6"  (1.676 m)   Wt 156 lb (70.8 kg)   LMP 07/01/1996   BMI 25.18 kg/m   BP Readings from Last 3 Encounters:  05/03/20 104/66  02/02/20 114/62  01/22/20 120/84    Wt Readings from Last 3 Encounters:  05/03/20 156 lb (70.8 kg)  02/02/20 153 lb (69.4 kg)  01/22/20 152 lb (68.9 kg)     Physical Exam Constitutional:      General: She is not in acute distress.    Appearance: She is well-developed.  Cardiovascular:     Rate and Rhythm: Normal rate  and regular rhythm.  Pulmonary:     Breath sounds: Normal breath sounds.  Skin:    General: Skin is warm and dry.  Neurological:     Mental Status: She is alert and oriented to person, place, and time.       Assessment & Plan:   Lylah was seen today for follow-up.  Diagnoses and all orders for this visit:  Acquired hypothyroidism -     CBC with Differential/Platelet -     CMP14+EGFR -     TSH + free T4  Gastroesophageal reflux disease with esophagitis without hemorrhage -     CBC with Differential/Platelet -      CMP14+EGFR  Hypothyroidism, unspecified type -     levothyroxine (SYNTHROID) 75 MCG tablet; Take 1 tablet (75 mcg total) by mouth daily before breakfast.  Other orders -     omeprazole (PRILOSEC) 40 MG capsule; Take 1 capsule (40 mg total) by mouth daily.       I have discontinued Tia Masker. Annette Silva's budesonide-formoterol, albuterol, and valACYclovir. I have also changed her omeprazole. Additionally, I am having her maintain her Multiple Vitamins-Minerals (CENTRUM SILVER ADULT 50+ PO), Calcium Carbonate-Vit D-Min (CALCIUM 1200 PO), Cholecalciferol (D3 ADULT PO), vitamin B-12, Fish Oil, Ibuprofen (ADVIL PO), and levothyroxine.  Allergies as of 05/03/2020      Reactions   Benadryl [diphenhydramine Hcl (sleep)]    HYPER      Medication List       Accurate as of May 03, 2020  3:38 PM. If you have any questions, ask your nurse or doctor.        STOP taking these medications   albuterol 108 (90 Base) MCG/ACT inhaler Commonly known as: VENTOLIN HFA Stopped by: Claretta Fraise, MD   budesonide-formoterol 80-4.5 MCG/ACT inhaler Commonly known as: SYMBICORT Stopped by: Claretta Fraise, MD   valACYclovir 1000 MG tablet Commonly known as: VALTREX Stopped by: Claretta Fraise, MD     TAKE these medications   ADVIL PO Take by mouth as needed.   CALCIUM 1200 PO Take by mouth.   CENTRUM SILVER ADULT 50+ PO Take 1 tablet by mouth daily.   D3 ADULT PO Take 800 mg by mouth daily.   Fish Oil 1200 MG Caps Take 2 capsules by mouth daily.   levothyroxine 75 MCG tablet Commonly known as: SYNTHROID Take 1 tablet (75 mcg total) by mouth daily before breakfast.   omeprazole 40 MG capsule Commonly known as: PRILOSEC Take 1 capsule (40 mg total) by mouth daily. What changed:   medication strength  how much to take Changed by: Claretta Fraise, MD   vitamin B-12 1000 MCG tablet Commonly known as: CYANOCOBALAMIN Take 1,000 mcg by mouth daily.        Follow-up: No follow-ups on  file.  Claretta Fraise, M.D.

## 2020-05-04 LAB — CBC WITH DIFFERENTIAL/PLATELET
Basophils Absolute: 0 10*3/uL (ref 0.0–0.2)
Basos: 1 %
EOS (ABSOLUTE): 0 10*3/uL (ref 0.0–0.4)
Eos: 1 %
Hematocrit: 40.2 % (ref 34.0–46.6)
Hemoglobin: 13.5 g/dL (ref 11.1–15.9)
Immature Grans (Abs): 0 10*3/uL (ref 0.0–0.1)
Immature Granulocytes: 0 %
Lymphocytes Absolute: 2 10*3/uL (ref 0.7–3.1)
Lymphs: 43 %
MCH: 29.4 pg (ref 26.6–33.0)
MCHC: 33.6 g/dL (ref 31.5–35.7)
MCV: 88 fL (ref 79–97)
Monocytes Absolute: 0.4 10*3/uL (ref 0.1–0.9)
Monocytes: 7 %
Neutrophils Absolute: 2.3 10*3/uL (ref 1.4–7.0)
Neutrophils: 48 %
Platelets: 214 10*3/uL (ref 150–450)
RBC: 4.59 x10E6/uL (ref 3.77–5.28)
RDW: 13.6 % (ref 11.7–15.4)
WBC: 4.8 10*3/uL (ref 3.4–10.8)

## 2020-05-04 LAB — CMP14+EGFR
ALT: 14 IU/L (ref 0–32)
AST: 21 IU/L (ref 0–40)
Albumin/Globulin Ratio: 2.4 — ABNORMAL HIGH (ref 1.2–2.2)
Albumin: 4.5 g/dL (ref 3.8–4.8)
Alkaline Phosphatase: 80 IU/L (ref 48–121)
BUN/Creatinine Ratio: 15 (ref 12–28)
BUN: 16 mg/dL (ref 8–27)
Bilirubin Total: 0.3 mg/dL (ref 0.0–1.2)
CO2: 26 mmol/L (ref 20–29)
Calcium: 9.4 mg/dL (ref 8.7–10.3)
Chloride: 103 mmol/L (ref 96–106)
Creatinine, Ser: 1.07 mg/dL — ABNORMAL HIGH (ref 0.57–1.00)
GFR calc Af Amer: 65 mL/min/{1.73_m2} (ref >59)
GFR calc non Af Amer: 56 mL/min/{1.73_m2} — ABNORMAL LOW (ref >59)
Globulin, Total: 1.9 g/dL (ref 1.5–4.5)
Glucose: 81 mg/dL (ref 65–99)
Potassium: 4 mmol/L (ref 3.5–5.2)
Sodium: 141 mmol/L (ref 134–144)
Total Protein: 6.4 g/dL (ref 6.0–8.5)

## 2020-05-04 LAB — TSH+FREE T4
Free T4: 1.33 ng/dL (ref 0.82–1.77)
TSH: 1.95 u[IU]/mL (ref 0.450–4.500)

## 2020-05-05 NOTE — Progress Notes (Signed)
Hello Annette Silva,  Your lab result is normal and/or stable.Some minor variations that are not significant are commonly marked abnormal, but do not represent any medical problem for you.  Best regards, Camauri Fleece, M.D.

## 2020-05-07 ENCOUNTER — Ambulatory Visit

## 2020-05-09 ENCOUNTER — Other Ambulatory Visit: Payer: Self-pay

## 2020-05-09 ENCOUNTER — Ambulatory Visit (INDEPENDENT_AMBULATORY_CARE_PROVIDER_SITE_OTHER): Admitting: Nurse Practitioner

## 2020-05-09 DIAGNOSIS — Z8616 Personal history of COVID-19: Secondary | ICD-10-CM

## 2020-05-09 NOTE — Progress Notes (Signed)
@Patient  ID: Annette Silva, female    DOB: Feb 01, 1961, 59 y.o.   MRN: [id number]  Chief Complaint  Patient presents with  . Follow-up    3 month follow up, no questions or concerns    Referring provider: Claretta Fraise, MD  59 year old female with history ofmini stroke, hypothyroidism, anxiety, depression.Diagnosed with Covid in November 2020.   HPI  Patient presents today for post Covid care clinic follow-up.  Patient states that since her last visit here she has been about the same.  She has been following up with physical therapy and speech therapy.  She states that she is going to have to quit going to these appointments due to financial concerns.  The physical therapist and speech therapist have given her exercises that she can do at home that will hopefully help her improve over time.  Patient states that her cough and shortness of breath have completely subsided at this point she states that she is getting more energy back.  Patient states that she is still having ongoing issues with loss of taste and smell.  She states that she has been using some essential holes at home which have slightly helped.  Patient refuses to reach out to [facility] or have another neurology referral at this time.  Denies f/c/s, n/v/d, hemoptysis, PND, chest pain or edema.       Allergies  Allergen Reactions  . Benadryl [Diphenhydramine Hcl (Sleep)]     HYPER    Immunization History  Administered Date(s) Administered  . Moderna SARS-COVID-2 Vaccination 12/21/2019, 01/19/2020    Past Medical History:  Diagnosis Date  . Allergy   . Anxiety   . COVID-19 virus infection 07/2019   hx of  . Depression   . Mini stroke ([place]) 5/ 2015  . Salivary stones   . Thyroid disease     Tobacco History: Social History   Tobacco Use  Smoking Status Never Smoker  Smokeless Tobacco Never Used   Counseling given: Yes   Outpatient Encounter Medications as of 05/09/2020  Medication  Sig  . Calcium Carbonate-Vit D-Min (CALCIUM 1200 PO) Take by mouth.  . Cholecalciferol (D3 ADULT PO) Take 800 mg by mouth daily.  . Ibuprofen (ADVIL PO) Take by mouth as needed.  Marland Kitchen levothyroxine (SYNTHROID) 75 MCG tablet Take 1 tablet (75 mcg total) by mouth daily before breakfast.  . Multiple Vitamins-Minerals (CENTRUM SILVER ADULT 50+ PO) Take 1 tablet by mouth daily.  . Omega-3 Fatty Acids (FISH OIL) 1200 MG CAPS Take 2 capsules by mouth daily.  Marland Kitchen omeprazole (PRILOSEC) 40 MG capsule Take 1 capsule (40 mg total) by mouth daily.  . vitamin B-12 (CYANOCOBALAMIN) 1000 MCG tablet Take 1,000 mcg by mouth daily.   No facility-administered encounter medications on file as of 05/09/2020.     Review of Systems  Review of Systems  Constitutional: Negative.  Negative for fever.  HENT: Negative.   Respiratory: Negative for cough and shortness of breath.   Cardiovascular: Negative.  Negative for chest pain, palpitations and leg swelling.  Gastrointestinal: Negative.   Allergic/Immunologic: Negative.   Neurological: Positive for speech difficulty.  Psychiatric/Behavioral: Negative.        Physical Exam  BP 124/82 (BP Location: Right Arm)   Pulse 60   Temp (!) 96.7 F (35.9 C)   Wt 157 lb (71.2 kg)   LMP 07/01/1996   SpO2 97%   BMI 25.34 kg/m   Wt Readings from Last 5 Encounters:  05/09/20 157  lb (71.2 kg)  05/03/20 156 lb (70.8 kg)  02/02/20 153 lb (69.4 kg)  01/22/20 152 lb (68.9 kg)  12/29/19 154 lb 4 oz (70 kg)     Physical Exam Vitals and nursing note reviewed.  Constitutional:      General: She is not in acute distress.    Appearance: She is well-developed.  Cardiovascular:     Rate and Rhythm: Normal rate and regular rhythm.  Pulmonary:     Effort: Pulmonary effort is normal.     Breath sounds: Normal breath sounds.  Musculoskeletal:     Right lower leg: No edema.     Left lower leg: No edema.  Neurological:     Mental Status: She is alert and oriented to  person, place, and time.  Psychiatric:        Mood and Affect: Mood normal.        Behavior: Behavior normal.       Assessment & Plan:   History of COVID-19 Brain Fog/Memory Loss Trouble finding words Numbness and tingling to arms and legs Loss of taste and smell:  Patient does not want to proceed with physical therapy or speech therapy due to financial concerns  Patient does not want to try recommendations for loss of taste and smell  Stay active  Stay well hydrated   Follow up:  Follow up with PCP as scheduled      Fenton Foy, NP 05/10/2020

## 2020-05-10 ENCOUNTER — Ambulatory Visit

## 2020-05-10 NOTE — Patient Instructions (Signed)
History of Covid Brain Fog/Memory Loss Trouble finding words Numbness and tingling to arms and legs Loss of taste and smell:  Patient does not want to proceed with physical therapy or speech therapy due to financial concerns  Patient does not want to try recommendations for loss of taste and smell  Stay active  Stay well hydrated   Follow up:  Follow up with PCP as scheduled

## 2020-05-10 NOTE — Assessment & Plan Note (Signed)
Brain Fog/Memory Loss Trouble finding words Numbness and tingling to arms and legs Loss of taste and smell:  Patient does not want to proceed with physical therapy or speech therapy due to financial concerns  Patient does not want to try recommendations for loss of taste and smell  Stay active  Stay well hydrated   Follow up:  Follow up with PCP as scheduled

## 2020-05-31 ENCOUNTER — Ambulatory Visit (INDEPENDENT_AMBULATORY_CARE_PROVIDER_SITE_OTHER): Admitting: Family Medicine

## 2020-05-31 ENCOUNTER — Other Ambulatory Visit: Payer: Self-pay

## 2020-05-31 ENCOUNTER — Encounter: Payer: Self-pay | Admitting: Family Medicine

## 2020-05-31 VITALS — BP 108/57 | HR 62 | Temp 97.1°F | Ht 66.0 in | Wt 156.2 lb

## 2020-05-31 DIAGNOSIS — M79604 Pain in right leg: Secondary | ICD-10-CM

## 2020-05-31 DIAGNOSIS — Z808 Family history of malignant neoplasm of other organs or systems: Secondary | ICD-10-CM

## 2020-05-31 DIAGNOSIS — M79605 Pain in left leg: Secondary | ICD-10-CM | POA: Diagnosis not present

## 2020-05-31 MED ORDER — PREDNISONE 10 MG (21) PO TBPK: ORAL_TABLET | ORAL | 0 refills | Status: DC

## 2020-05-31 NOTE — Progress Notes (Signed)
Subjective: CC: Bilateral lower extremity pain PCP: Claretta Fraise, MD IAX:[id number] Annette Silva is a 59 y.o. female presenting to clinic today for:  1.  Bilateral leg pain Patient reports about a 1 week history of bilateral leg pain of uncertain etiology that she describes as aching.  She denies any change in physical activity, medications.  She hydrates well.  Denies any discoloration of the legs.  Pain is constant and exacerbated by activity.  She reports easy bruising that has been ongoing since being sick with Covid last November.  She does not feel the pain to be muscular but rather bony in nature.  Denies any night sweats, unplanned weight loss.  She does have a family history significant for bone cancer in her father, who passed away in his late 3s from the illness.  She is uncertain as to what type of bone cancer he had.  She denies any family history of autoimmune disease or demyelinating diseases including multiple sclerosis.  No sensory changes to the lower extremity.  Does not feel weakness.  She has tried OTC analgesics including NSAIDs and Tylenol but these have been unhelpful and therefore she discontinued them.   ROS: Per HPI  Allergies  Allergen Reactions  . Benadryl [Diphenhydramine Hcl (Sleep)]     HYPER   Past Medical History:  Diagnosis Date  . Allergy   . Anxiety   . COVID-19 virus infection 07/2019   hx of  . Depression   . Mini stroke ([place]) 5/ 2015  . Salivary stones   . Thyroid disease     Current Outpatient Medications:  .  Calcium Carbonate-Vit D-Min (CALCIUM 1200 PO), Take by mouth., Disp: , Rfl:  .  Cholecalciferol (D3 ADULT PO), Take 800 mg by mouth daily., Disp: , Rfl:  .  Ibuprofen (ADVIL PO), Take by mouth as needed., Disp: , Rfl:  .  levothyroxine (SYNTHROID) 75 MCG tablet, Take 1 tablet (75 mcg total) by mouth daily before breakfast., Disp: 90 tablet, Rfl: 1 .  Multiple Vitamins-Minerals (CENTRUM SILVER ADULT 50+ PO), Take 1 tablet by mouth  daily., Disp: , Rfl:  .  Omega-3 Fatty Acids (FISH OIL) 1200 MG CAPS, Take 2 capsules by mouth daily., Disp: , Rfl:  .  omeprazole (PRILOSEC) 40 MG capsule, Take 1 capsule (40 mg total) by mouth daily., Disp: 90 capsule, Rfl: 1 .  vitamin B-12 (CYANOCOBALAMIN) 1000 MCG tablet, Take 1,000 mcg by mouth daily., Disp: , Rfl:  Social History   Socioeconomic History  . Marital status: Married    Spouse name: Eddie Dibbles  . Number of children: Not on file  . Years of education: Not on file  . Highest education level: High school graduate  Occupational History  . Not on file  Tobacco Use  . Smoking status: Never Smoker  . Smokeless tobacco: Never Used  Vaping Use  . Vaping Use: Never used  Substance and Sexual Activity  . Alcohol use: Yes    Comment: socially  . Drug use: No  . Sexual activity: Never  Other Topics Concern  . Not on file  Social History Narrative   Lives with husband   Caffeine- coffee 4 c daily   Social Determinants of Health   Financial Resource Strain:   . Difficulty of Paying Living Expenses: Not on file  Food Insecurity:   . Worried About Charity fundraiser in the Last Year: Not on file  . Ran Out of Food in the Last Year: Not on file  Transportation Needs:   . Film/video editor (Medical): Not on file  . Lack of Transportation (Non-Medical): Not on file  Physical Activity:   . Days of Exercise per Week: Not on file  . Minutes of Exercise per Session: Not on file  Stress:   . Feeling of Stress : Not on file  Social Connections:   . Frequency of Communication with Friends and Family: Not on file  . Frequency of Social Gatherings with Friends and Family: Not on file  . Attends Religious Services: Not on file  . Active Member of Clubs or Organizations: Not on file  . Attends Archivist Meetings: Not on file  . Marital Status: Not on file  Intimate Partner Violence:   . Fear of Current or Ex-Partner: Not on file  . Emotionally Abused: Not on file   . Physically Abused: Not on file  . Sexually Abused: Not on file   Family History  Problem Relation Age of Onset  . Stroke Mother        passed at age 32  . Arthritis Mother   . COPD Mother   . Depression Mother   . Diabetes Mother   . Hearing loss Mother   . Heart disease Mother   . Hypertension Mother   . Cancer Father        bone passed at 20    Objective: Office vital signs reviewed. BP (!) 108/57   Pulse 62   Temp (!) 97.1 F (36.2 C)   Ht 5' 6"  (1.676 m)   Wt 156 lb 3.2 oz (70.9 kg)   LMP 07/01/1996   SpO2 99%   BMI 25.21 kg/m   Physical Examination:  General: Awake, alert, well nourished, No acute distress Extremities: warm, well perfused, No edema, cyanosis or clubbing; +2 pulses bilaterally MSK: mildly antalgic gait and station; no joint deformities, swelling, erythema or increased warmth.  Extremities nontender to palpation.  No palpable bony or soft tissue abnormalities. Skin: dry; intact; no rashes or lesions Neuro: 4/5 knee Strength in extension and flexion. 5/5 hip flexor and extensor strenght and light touch sensation grossly intact, patellar DTRs 1/4  Assessment/ Plan: 59 y.o. female   1. Bilateral leg pain Uncertain etiology.  Her symptoms almost sound like tibial stress syndrome but there is been no preceding activities or inciting events.  She had excellent pulses on exam so I doubt peripheral artery disease.  There were no focal findings on exam.  Given her family history of bone cancer in her father, I have ordered a pathologist smear review, LDH.  We will also obtain CRP, sed rate and CMP.  I will also place her on a short course of prednisone to see if perhaps this might help given refractory symptoms to OTC analgesics.  Will consider xray if no improvement (this was unavailable at time of visit) - C-reactive protein - Sedimentation Rate - CMP14+EGFR - Pathologist smear review - Lactate Dehydrogenase  2. Family history of bone cancer In  father.  - Pathologist smear review - Lactate Dehydrogenase   No orders of the defined types were placed in this encounter.  No orders of the defined types were placed in this encounter.    Janora Norlander, DO [place] [phone]

## 2020-06-01 LAB — CMP14+EGFR
ALT: 15 IU/L (ref 0–32)
AST: 18 IU/L (ref 0–40)
Albumin/Globulin Ratio: 2.1 (ref 1.2–2.2)
Albumin: 4.6 g/dL (ref 3.8–4.8)
Alkaline Phosphatase: 82 IU/L (ref 44–121)
BUN/Creatinine Ratio: 15 (ref 12–28)
BUN: 14 mg/dL (ref 8–27)
Bilirubin Total: 0.5 mg/dL (ref 0.0–1.2)
CO2: 28 mmol/L (ref 20–29)
Calcium: 9.8 mg/dL (ref 8.7–10.3)
Chloride: 103 mmol/L (ref 96–106)
Creatinine, Ser: 0.96 mg/dL (ref 0.57–1.00)
GFR calc Af Amer: 74 mL/min/{1.73_m2} (ref >59)
GFR calc non Af Amer: 64 mL/min/{1.73_m2} (ref >59)
Globulin, Total: 2.2 g/dL (ref 1.5–4.5)
Glucose: 96 mg/dL (ref 65–99)
Potassium: 4.2 mmol/L (ref 3.5–5.2)
Sodium: 142 mmol/L (ref 134–144)
Total Protein: 6.8 g/dL (ref 6.0–8.5)

## 2020-06-01 LAB — LACTATE DEHYDROGENASE: LDH: 77 IU/L — ABNORMAL LOW (ref 119–226)

## 2020-06-01 LAB — SEDIMENTATION RATE: Sed Rate: 2 mm/hr (ref 0–40)

## 2020-06-01 LAB — C-REACTIVE PROTEIN: CRP: <1 mg/L (ref 0–10)

## 2020-06-04 LAB — PATHOLOGIST SMEAR REVIEW
Basophils Absolute: 0 10*3/uL (ref 0.0–0.2)
Basos: 1 %
EOS (ABSOLUTE): 0 10*3/uL (ref 0.0–0.4)
Eos: 1 %
Hematocrit: 42.1 % (ref 34.0–46.6)
Hemoglobin: 14.4 g/dL (ref 11.1–15.9)
Immature Grans (Abs): 0 10*3/uL (ref 0.0–0.1)
Immature Granulocytes: 0 %
Lymphocytes Absolute: 2.2 10*3/uL (ref 0.7–3.1)
Lymphs: 45 %
MCH: 29.9 pg (ref 26.6–33.0)
MCHC: 34.2 g/dL (ref 31.5–35.7)
MCV: 88 fL (ref 79–97)
Monocytes Absolute: 0.4 10*3/uL (ref 0.1–0.9)
Monocytes: 8 %
Neutrophils Absolute: 2.2 10*3/uL (ref 1.4–7.0)
Neutrophils: 45 %
Platelets: 238 10*3/uL (ref 150–450)
RBC: 4.81 x10E6/uL (ref 3.77–5.28)
RDW: 13.2 % (ref 11.7–15.4)
WBC: 4.9 10*3/uL (ref 3.4–10.8)

## 2020-06-12 ENCOUNTER — Ambulatory Visit (INDEPENDENT_AMBULATORY_CARE_PROVIDER_SITE_OTHER)

## 2020-06-12 ENCOUNTER — Encounter: Payer: Self-pay | Admitting: Family Medicine

## 2020-06-12 ENCOUNTER — Other Ambulatory Visit: Payer: Self-pay

## 2020-06-12 ENCOUNTER — Ambulatory Visit (INDEPENDENT_AMBULATORY_CARE_PROVIDER_SITE_OTHER): Admitting: Family Medicine

## 2020-06-12 VITALS — BP 117/76 | HR 62 | Temp 97.9°F | Ht 66.0 in | Wt 158.0 lb

## 2020-06-12 DIAGNOSIS — M533 Sacrococcygeal disorders, not elsewhere classified: Secondary | ICD-10-CM

## 2020-06-12 DIAGNOSIS — M79604 Pain in right leg: Secondary | ICD-10-CM | POA: Diagnosis not present

## 2020-06-12 DIAGNOSIS — M79605 Pain in left leg: Secondary | ICD-10-CM

## 2020-06-12 DIAGNOSIS — M2550 Pain in unspecified joint: Secondary | ICD-10-CM | POA: Diagnosis not present

## 2020-06-12 MED ORDER — PREDNISONE 20 MG PO TABS: ORAL_TABLET | ORAL | 0 refills | Status: DC

## 2020-06-12 NOTE — Progress Notes (Signed)
Subjective:  Patient ID: Annette Silva, female    DOB: Aug 30, 1961  Age: 59 y.o. MRN: [id number]  CC: Bilateral leg pain (3 weeks or more) and Tailbone Pain (Notice friday night)   HPI Annette Silva presents for continued muscle and joint pains.  This is been ongoing almost a year now ever since she contracted Covid in November of last year.  The current leg pain is moderately severe.  It has been going on over 3 weeks.  It is just the latest and a year-long scenario of multiple aches and pains.  Today she expresses frustration through tearfully expressing that she is sick and tired of being  sick and tired.  She went to the post Covid clinic without any resolution or new insights obtained.  She is hurting and does not understand why no one can come up with a reason for it.  She has bone pain at that shins also.  Is intermittent through the day.  She is also having some right ankle pain and the left is normal.  She denies any edema of the lower extremities.  After discussion I realized that she had seen Dr. Lajuana Ripple in my absence a couple of weeks ago and that I had missed that report.  I went back and reviewed that with her as a result.  This seem to shake her confidence in my knowledge of her case unfortunately.  Depression screen [facility] 2/9 06/12/2020 05/31/2020 05/03/2020  Decreased Interest 0 0 0  Down, Depressed, Hopeless 0 0 0  PHQ - 2 Score 0 0 0  Altered sleeping - - -  Tired, decreased energy - - -  Change in appetite - - -  Feeling bad or failure about yourself  - - -  Trouble concentrating - - -  Moving slowly or fidgety/restless - - -  Suicidal thoughts - - -  PHQ-9 Score - - -  Difficult doing work/chores - - -    History Annette Silva has a past medical history of Allergy, Anxiety, COVID-19 virus infection (07/2019), Depression, Mini stroke ([place]) (5/ 2015), Salivary stones, and Thyroid disease.   She has a past surgical history that includes Abdominal hysterectomy (11 / 1997);  Cesarean section (1993); and Tooth extraction (Left).   Her family history includes Arthritis in her mother; COPD in her mother; Cancer in her father; Depression in her mother; Diabetes in her mother; Hearing loss in her mother; Heart disease in her mother; Hypertension in her mother; Stroke in her mother.She reports that she has never smoked. She has never used smokeless tobacco. She reports current alcohol use. She reports that she does not use drugs.    ROS Review of Systems  Constitutional: Negative.   HENT: Negative.   Eyes: Negative for visual disturbance.  Respiratory: Negative for shortness of breath.   Cardiovascular: Negative for chest pain.  Gastrointestinal: Negative for abdominal pain.  Musculoskeletal: Positive for arthralgias and myalgias.  Hematological: Does not bruise/bleed easily.    Objective:  BP 117/76   Pulse 62   Temp 97.9 F (36.6 C) (Temporal)   Ht 5\' 6"  (1.676 m)   Wt 158 lb (71.7 kg)   LMP 07/01/1996   BMI 25.50 kg/m   BP Readings from Last 3 Encounters:  06/12/20 117/76  05/31/20 (!) 108/57  05/09/20 124/82    Wt Readings from Last 3 Encounters:  06/12/20 158 lb (71.7 kg)  05/31/20 156 lb 3.2 oz (70.9 kg)  05/09/20 157 lb (71.2 kg)  Physical Exam Constitutional:      General: She is not in acute distress.    Appearance: She is well-developed.  Cardiovascular:     Rate and Rhythm: Normal rate and regular rhythm.  Pulmonary:     Breath sounds: Normal breath sounds.  Skin:    General: Skin is warm and dry.  Neurological:     Mental Status: She is alert and oriented to person, place, and time.  Psychiatric:        Attention and Perception: Attention and perception normal.        Mood and Affect: Mood is depressed. Affect is angry.        Behavior: Behavior normal. Behavior is cooperative.        Cognition and Memory: Cognition normal.        Judgment: Judgment is impulsive.       Assessment & Plan:   Emmilia was seen today  for bilateral leg pain and tailbone pain.  Diagnoses and all orders for this visit:  Bilateral leg pain -     DG Tibia/Fibula Right; Future -     DG Tibia/Fibula Left; Future -     predniSONE (DELTASONE) 20 MG tablet; One twice daily with food for 2 weeks. Then one daily for 2 weeks -     CYCLIC CITRUL PEPTIDE ANTIBODY, IGG/IGA -     Arthritis Panel -     Anti-DNA antibody, double-stranded -     Scleroderma Diagnostic Profile -     Specimen status report  Tail bone pain -     DG Sacrum/Coccyx; Future -     predniSONE (DELTASONE) 20 MG tablet; One twice daily with food for 2 weeks. Then one daily for 2 weeks -     CYCLIC CITRUL PEPTIDE ANTIBODY, IGG/IGA -     Arthritis Panel -     Anti-DNA antibody, double-stranded  Arthralgia of multiple sites, bilateral -     predniSONE (DELTASONE) 20 MG tablet; One twice daily with food for 2 weeks. Then one daily for 2 weeks -     CYCLIC CITRUL PEPTIDE ANTIBODY, IGG/IGA -     Arthritis Panel -     Anti-DNA antibody, double-stranded -     Scleroderma Diagnostic Profile -     Specimen status report       I have discontinued Tia Masker. Hearty's predniSONE. I am also having her start on predniSONE. Additionally, I am having her maintain her Multiple Vitamins-Minerals (CENTRUM SILVER ADULT 50+ PO), Calcium Carbonate-Vit D-Min (CALCIUM 1200 PO), Cholecalciferol (D3 ADULT PO), vitamin B-12, Fish Oil, Ibuprofen (ADVIL PO), levothyroxine, and omeprazole.  Allergies as of 06/12/2020      Reactions   Benadryl [diphenhydramine Hcl (sleep)]    HYPER      Medication List       Accurate as of June 12, 2020 11:59 PM. If you have any questions, ask your nurse or doctor.        STOP taking these medications   predniSONE 10 MG (21) Tbpk tablet Commonly known as: STERAPRED UNI-PAK 21 TAB Replaced by: predniSONE 20 MG tablet Stopped by: Claretta Fraise, MD     TAKE these medications   ADVIL PO Take by mouth as needed.   CALCIUM 1200 PO Take  by mouth.   CENTRUM SILVER ADULT 50+ PO Take 1 tablet by mouth daily.   D3 ADULT PO Take 800 mg by mouth daily.   Fish Oil 1200 MG Caps Take 2 capsules by mouth daily.  levothyroxine 75 MCG tablet Commonly known as: SYNTHROID Take 1 tablet (75 mcg total) by mouth daily before breakfast.   omeprazole 40 MG capsule Commonly known as: PRILOSEC Take 1 capsule (40 mg total) by mouth daily.   predniSONE 20 MG tablet Commonly known as: DELTASONE One twice daily with food for 2 weeks. Then one daily for 2 weeks Replaces: predniSONE 10 MG (21) Tbpk tablet Started by: Claretta Fraise, MD   vitamin B-12 1000 MCG tablet Commonly known as: CYANOCOBALAMIN Take 1,000 mcg by mouth daily.        Follow-up: No follow-ups on file.  Claretta Fraise, M.D.

## 2020-06-14 LAB — ARTHRITIS PANEL
Basophils Absolute: 0 10*3/uL (ref 0.0–0.2)
Basos: 1 %
EOS (ABSOLUTE): 0.1 10*3/uL (ref 0.0–0.4)
Eos: 2 %
Hematocrit: 43.1 % (ref 34.0–46.6)
Hemoglobin: 14.1 g/dL (ref 11.1–15.9)
Immature Grans (Abs): 0 10*3/uL (ref 0.0–0.1)
Immature Granulocytes: 0 %
Lymphocytes Absolute: 1.6 10*3/uL (ref 0.7–3.1)
Lymphs: 40 %
MCH: 28.8 pg (ref 26.6–33.0)
MCHC: 32.7 g/dL (ref 31.5–35.7)
MCV: 88 fL (ref 79–97)
Monocytes Absolute: 0.3 10*3/uL (ref 0.1–0.9)
Monocytes: 7 %
Neutrophils Absolute: 2 10*3/uL (ref 1.4–7.0)
Neutrophils: 50 %
Platelets: 237 10*3/uL (ref 150–450)
RBC: 4.9 x10E6/uL (ref 3.77–5.28)
RDW: 13.2 % (ref 11.7–15.4)
Rheumatoid fact SerPl-aCnc: <10 IU/mL (ref 0.0–13.9)
Sed Rate: 2 mm/hr (ref 0–40)
Uric Acid: 5.2 mg/dL (ref 3.0–7.2)
WBC: 3.9 10*3/uL (ref 3.4–10.8)

## 2020-06-14 LAB — CYCLIC CITRUL PEPTIDE ANTIBODY, IGG/IGA: Cyclic Citrullin Peptide Ab: 7 units (ref 0–19)

## 2020-06-14 LAB — ANTI-DNA ANTIBODY, DOUBLE-STRANDED: dsDNA Ab: 2 IU/mL (ref 0–9)

## 2020-06-15 LAB — SCLERODERMA DIAGNOSTIC PROFILE
Anti Nuclear Antibody (ANA): NEGATIVE
Scleroderma (Scl-70) (ENA) Antibody, IgG: <0.2 AI (ref 0.0–0.9)

## 2020-06-15 LAB — SPECIMEN STATUS REPORT

## 2020-07-11 ENCOUNTER — Ambulatory Visit (INDEPENDENT_AMBULATORY_CARE_PROVIDER_SITE_OTHER): Admitting: Family Medicine

## 2020-07-11 ENCOUNTER — Other Ambulatory Visit: Payer: Self-pay

## 2020-07-11 ENCOUNTER — Encounter: Payer: Self-pay | Admitting: Family Medicine

## 2020-07-11 VITALS — BP 132/74 | HR 70 | Temp 97.6°F | Resp 20 | Ht 66.0 in | Wt 160.0 lb

## 2020-07-11 DIAGNOSIS — M3581 Multisystem inflammatory syndrome: Secondary | ICD-10-CM | POA: Diagnosis not present

## 2020-07-11 MED ORDER — MIRTAZAPINE 7.5 MG PO TABS: 7.5000 mg | ORAL_TABLET | Freq: Every day | ORAL | 2 refills | Status: DC

## 2020-07-11 NOTE — Progress Notes (Signed)
Subjective:  Patient ID: Annette Silva, female    DOB: 05-11-1961  Age: 59 y.o. MRN: [id number]  CC: 3 week recheck   HPI Annette Silva presents for follow-up on her widespread intermittent pains.  The shins seem to be the most significantly affected.  She still has brain fog continuously.  She has trouble concentrating.  She cannot sleep very well but she stays very tired.  She cannot stay asleep at night so she does not want to take something to help her get to sleep.  Patient reports that the prednisone did not help and she was very concerned about side effects so she discontinued it after about a week.  Depression screen [facility] 2/9 07/11/2020 06/12/2020 05/31/2020  Decreased Interest 0 0 0  Down, Depressed, Hopeless 0 0 0  PHQ - 2 Score 0 0 0  Altered sleeping - - -  Tired, decreased energy - - -  Change in appetite - - -  Feeling bad or failure about yourself  - - -  Trouble concentrating - - -  Moving slowly or fidgety/restless - - -  Suicidal thoughts - - -  PHQ-9 Score - - -  Difficult doing work/chores - - -    History Annette Silva has a past medical history of Allergy, Anxiety, COVID-19 virus infection (07/2019), Depression, Mini stroke ([place]) (5/ 2015), Salivary stones, and Thyroid disease.   She has a past surgical history that includes Abdominal hysterectomy (11 / 1997); Cesarean section (1993); and Tooth extraction (Left).   Her family history includes Arthritis in her mother; COPD in her mother; Cancer in her father; Depression in her mother; Diabetes in her mother; Hearing loss in her mother; Heart disease in her mother; Hypertension in her mother; Stroke in her mother.She reports that she has never smoked. She has never used smokeless tobacco. She reports current alcohol use. She reports that she does not use drugs.    ROS Review of Systems  Constitutional: Negative.   HENT: Negative.   Eyes: Negative for visual disturbance.  Respiratory: Negative for shortness of  breath.   Cardiovascular: Negative for chest pain.  Gastrointestinal: Negative for abdominal pain.  Musculoskeletal: Positive for arthralgias and myalgias.  Psychiatric/Behavioral: Positive for agitation, confusion, decreased concentration, dysphoric mood and sleep disturbance. The patient is nervous/anxious.     Objective:  BP 132/74   Pulse 70   Temp 97.6 F (36.4 C) (Temporal)   Resp 20   Ht 5\' 6"  (1.676 m)   Wt 160 lb (72.6 kg)   LMP 07/01/1996   BMI 25.82 kg/m   BP Readings from Last 3 Encounters:  07/11/20 132/74  06/12/20 117/76  05/31/20 (!) 108/57    Wt Readings from Last 3 Encounters:  07/11/20 160 lb (72.6 kg)  06/12/20 158 lb (71.7 kg)  05/31/20 156 lb 3.2 oz (70.9 kg)     Physical Exam Constitutional:      General: She is not in acute distress.    Appearance: She is well-developed.  Cardiovascular:     Rate and Rhythm: Normal rate and regular rhythm.  Pulmonary:     Breath sounds: Normal breath sounds.  Skin:    General: Skin is warm and dry.  Neurological:     Mental Status: She is alert and oriented to person, place, and time.  Psychiatric:        Attention and Perception: Attention normal.        Mood and Affect: Mood is anxious. Affect is labile.  Speech: Speech is rapid and pressured.        Behavior: Behavior is agitated and aggressive.        Thought Content: Thought content normal.        Cognition and Memory: Cognition normal.        Judgment: Judgment is impulsive.       Assessment & Plan:   Annette Silva was seen today for 3 week recheck.  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 For sleep    She should reconnect with post CoVID clinic   I have discontinued Tia Masker. Uhls's predniSONE. I am also having  her start on mirtazapine. Additionally, I am having her maintain her Multiple Vitamins-Minerals (CENTRUM SILVER ADULT 50+ PO), Calcium Carbonate-Vit D-Min (CALCIUM 1200 PO), Cholecalciferol (D3 ADULT PO), vitamin B-12, Fish Oil, Ibuprofen (ADVIL PO), levothyroxine, and omeprazole.  Allergies as of 07/11/2020      Reactions   Benadryl [diphenhydramine Hcl (sleep)]    HYPER      Medication List       Accurate as of July 11, 2020 11:59 PM. If you have any questions, ask your nurse or doctor.        STOP taking these medications   predniSONE 20 MG tablet Commonly known as: DELTASONE Stopped by: Claretta Fraise, MD     TAKE these medications   ADVIL PO Take by mouth as needed.   CALCIUM 1200 PO Take by mouth.   CENTRUM SILVER ADULT 50+ PO Take 1 tablet by mouth daily.   D3 ADULT PO Take 800 mg by mouth daily.   Fish Oil 1200 MG Caps Take 2 capsules by mouth daily.   levothyroxine 75 MCG tablet Commonly known as: SYNTHROID Take 1 tablet (75 mcg total) by mouth daily before breakfast.   mirtazapine 7.5 MG tablet Commonly known as: REMERON Take 1 tablet (7.5 mg total) by mouth at bedtime. For sleep Started by: Claretta Fraise, MD   omeprazole 40 MG capsule Commonly known as: PRILOSEC Take 1 capsule (40 mg total) by mouth daily. What changed:   when to take this  reasons to take this   vitamin B-12 1000 MCG tablet Commonly known as: CYANOCOBALAMIN Take 1,000 mcg by mouth daily.        Follow-up: Return in about 2 months (around 09/10/2020).  Claretta Fraise, M.D.

## 2020-07-14 ENCOUNTER — Encounter: Payer: Self-pay | Admitting: Family Medicine

## 2020-09-10 ENCOUNTER — Encounter: Payer: Self-pay | Admitting: Family Medicine

## 2020-09-10 ENCOUNTER — Ambulatory Visit (INDEPENDENT_AMBULATORY_CARE_PROVIDER_SITE_OTHER): Admitting: Family Medicine

## 2020-09-10 ENCOUNTER — Other Ambulatory Visit: Payer: Self-pay

## 2020-09-10 VITALS — BP 110/70 | HR 72 | Temp 97.9°F | Ht 66.0 in | Wt 162.8 lb

## 2020-09-10 DIAGNOSIS — M722 Plantar fascial fibromatosis: Secondary | ICD-10-CM

## 2020-09-10 DIAGNOSIS — E039 Hypothyroidism, unspecified: Secondary | ICD-10-CM

## 2020-09-10 DIAGNOSIS — M3581 Multisystem inflammatory syndrome: Secondary | ICD-10-CM | POA: Diagnosis not present

## 2020-09-10 NOTE — Progress Notes (Signed)
Subjective:  Patient ID: Annette Silva, female    DOB: 1960/09/10  Age: 60 y.o. MRN: [id number]  CC: Medical Management of Chronic Issues (2 month/)   HPI Annette Silva presents for  follow-up on  thyroid. The patient has a history of hypothyroidism for many years. It has been stable recently. Pt. denies any change in  voice, loss of hair, heat or cold intolerance. Energy level has been adequate to good. Patient denies constipation and diarrhea. No myxedema. Medication is as noted below. Verified that pt is taking it daily on an empty stomach. Well tolerated.  covid still causes bad days, but they are decreasing in number and severity. She struggles on those days. She crys a lot.  She still has brain fog.  She is not as tired as often.  She is adapting to sit and rest when she gets tired.  Depression screen [facility] 2/9 09/10/2020 07/11/2020 06/12/2020  Decreased Interest 0 0 0  Down, Depressed, Hopeless 0 0 0  PHQ - 2 Score 0 0 0  Altered sleeping - - -  Tired, decreased energy - - -  Change in appetite - - -  Feeling bad or failure about yourself  - - -  Trouble concentrating - - -  Moving slowly or fidgety/restless - - -  Suicidal thoughts - - -  PHQ-9 Score - - -  Difficult doing work/chores - - -    History Annette Silva has a past medical history of Allergy, Anxiety, COVID-19 virus infection (07/2019), Depression, Mini stroke ([place]) (5/ 2015), Salivary stones, and Thyroid disease.   She has a past surgical history that includes Abdominal hysterectomy (11 / 1997); Cesarean section (1993); and Tooth extraction (Left).   Her family history includes Arthritis in her mother; COPD in her mother; Cancer in her father; Depression in her mother; Diabetes in her mother; Hearing loss in her mother; Heart disease in her mother; Hypertension in her mother; Stroke in her mother.She reports that she has never smoked. She has never used smokeless tobacco. She reports current alcohol use. She reports  that she does not use drugs.    ROS Review of Systems  Constitutional: Negative.   HENT: Negative.   Eyes: Negative for visual disturbance.  Respiratory: Negative for shortness of breath.   Cardiovascular: Negative for chest pain.  Gastrointestinal: Negative for abdominal pain.  Musculoskeletal: Positive for arthralgias (base of heel on left) and neck pain (at base of skull, posteriorly).    Objective:  BP 110/70   Pulse 72   Temp 97.9 F (36.6 C) (Temporal)   Ht _0  (1.676 m)   Wt 162 lb 12.8 oz (73.8 kg)   LMP 07/01/1996   BMI 26.28 kg/m   BP Readings from Last 3 Encounters:  09/10/20 110/70  07/11/20 132/74  06/12/20 117/76    Wt Readings from Last 3 Encounters:  09/10/20 162 lb 12.8 oz (73.8 kg)  07/11/20 160 lb (72.6 kg)  06/12/20 158 lb (71.7 kg)     Physical Exam Constitutional:      General: She is not in acute distress.    Appearance: She is well-developed.  HENT:     Head: Normocephalic and atraumatic.  Eyes:     Conjunctiva/sclera: Conjunctivae normal.     Pupils: Pupils are equal, round, and reactive to light.  Neck:     Thyroid: No thyromegaly.  Cardiovascular:     Rate and Rhythm: Normal rate and regular rhythm.     Heart  sounds: Normal heart sounds. No murmur heard.   Pulmonary:     Effort: Pulmonary effort is normal. No respiratory distress.     Breath sounds: Normal breath sounds. No wheezing or rales.  Abdominal:     General: Bowel sounds are normal. There is no distension.     Palpations: Abdomen is soft.     Tenderness: There is no abdominal tenderness.  Musculoskeletal:        General: Tenderness (At the origin of the left plantar fascia.  Also at the nape of the neck at the origin of the trapezius to the left) present. Normal range of motion.     Cervical back: Normal range of motion and neck supple.  Lymphadenopathy:     Cervical: No cervical adenopathy.  Skin:    General: Skin is warm and dry.  Neurological:     Mental  Status: She is alert and oriented to person, place, and time.  Psychiatric:        Behavior: Behavior normal.        Thought Content: Thought content normal.        Judgment: Judgment normal.       Assessment & Plan:   Annette Silva was seen today for medical management of chronic issues.  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 Mceuen maintain her Multiple Vitamins-Minerals (CENTRUM SILVER ADULT 50+ PO), Calcium Carbonate-Vit D-Min (CALCIUM 1200 PO), Cholecalciferol (D3 ADULT PO), vitamin B-12, Fish Oil, Ibuprofen (ADVIL PO), levothyroxine, omeprazole, and mirtazapine.  Allergies as of 09/10/2020      Reactions   Benadryl [diphenhydramine Hcl (sleep)]    HYPER      Medication List       Accurate as of September 10, 2020  5:20 PM. If you have any questions, ask your nurse or doctor.        ADVIL PO Take by mouth as needed.   CALCIUM 1200 PO Take by mouth.   CENTRUM SILVER ADULT 50+ PO Take 1 tablet by mouth daily.   D3 ADULT PO Take 800 mg by mouth daily.   Fish Oil 1200 MG Caps Take 2 capsules by mouth daily.   levothyroxine 75 MCG tablet Commonly known as: SYNTHROID Take 1 tablet (75 mcg total) by mouth daily before breakfast.   mirtazapine 7.5 MG tablet Commonly known as: REMERON Take 1 tablet (7.5 mg total) by mouth at bedtime. For sleep   omeprazole 40 MG capsule Commonly known as: PRILOSEC Take 1 capsule (40 mg total) by mouth daily. What changed:   when to take this  reasons to take this   vitamin B-12 1000 MCG tablet Commonly known as: CYANOCOBALAMIN Take 1,000 mcg by mouth daily.        Follow-up: Return in about 6 months (around 03/10/2021),  or if symptoms worsen or fail to improve.  Claretta Fraise, M.D.

## 2020-09-11 LAB — TSH+FREE T4
Free T4: 1.42 ng/dL (ref 0.82–1.77)
TSH: 1.27 u[IU]/mL (ref 0.450–4.500)

## 2020-09-11 LAB — LIPID PANEL
Chol/HDL Ratio: 2.8 ratio (ref 0.0–4.4)
Cholesterol, Total: 210 mg/dL — ABNORMAL HIGH (ref 100–199)
HDL: 75 mg/dL (ref >39)
LDL Chol Calc (NIH): 119 mg/dL — ABNORMAL HIGH (ref 0–99)
Triglycerides: 88 mg/dL (ref 0–149)
VLDL Cholesterol Cal: 16 mg/dL (ref 5–40)

## 2020-09-11 LAB — CBC WITH DIFFERENTIAL/PLATELET
Basophils Absolute: 0 10*3/uL (ref 0.0–0.2)
Basos: 1 %
EOS (ABSOLUTE): 0.1 10*3/uL (ref 0.0–0.4)
Eos: 2 %
Hematocrit: 40.8 % (ref 34.0–46.6)
Hemoglobin: 13.8 g/dL (ref 11.1–15.9)
Immature Grans (Abs): 0 10*3/uL (ref 0.0–0.1)
Immature Granulocytes: 0 %
Lymphocytes Absolute: 2 10*3/uL (ref 0.7–3.1)
Lymphs: 43 %
MCH: 29.4 pg (ref 26.6–33.0)
MCHC: 33.8 g/dL (ref 31.5–35.7)
MCV: 87 fL (ref 79–97)
Monocytes Absolute: 0.3 10*3/uL (ref 0.1–0.9)
Monocytes: 7 %
Neutrophils Absolute: 2.2 10*3/uL (ref 1.4–7.0)
Neutrophils: 47 %
Platelets: 252 10*3/uL (ref 150–450)
RBC: 4.69 x10E6/uL (ref 3.77–5.28)
RDW: 12.7 % (ref 11.7–15.4)
WBC: 4.7 10*3/uL (ref 3.4–10.8)

## 2020-09-11 LAB — CMP14+EGFR
ALT: 16 IU/L (ref 0–32)
AST: 21 IU/L (ref 0–40)
Albumin/Globulin Ratio: 2.1 (ref 1.2–2.2)
Albumin: 4.4 g/dL (ref 3.8–4.8)
Alkaline Phosphatase: 95 IU/L (ref 44–121)
BUN/Creatinine Ratio: 13 (ref 12–28)
BUN: 12 mg/dL (ref 8–27)
Bilirubin Total: 0.3 mg/dL (ref 0.0–1.2)
CO2: 28 mmol/L (ref 20–29)
Calcium: 9.3 mg/dL (ref 8.7–10.3)
Chloride: 106 mmol/L (ref 96–106)
Creatinine, Ser: 0.94 mg/dL (ref 0.57–1.00)
GFR calc Af Amer: 76 mL/min/{1.73_m2} (ref >59)
GFR calc non Af Amer: 66 mL/min/{1.73_m2} (ref >59)
Globulin, Total: 2.1 g/dL (ref 1.5–4.5)
Glucose: 87 mg/dL (ref 65–99)
Potassium: 3.9 mmol/L (ref 3.5–5.2)
Sodium: 145 mmol/L — ABNORMAL HIGH (ref 134–144)
Total Protein: 6.5 g/dL (ref 6.0–8.5)

## 2020-09-17 NOTE — Progress Notes (Signed)
Hello Maybell,  Your lab result is normal and/or stable.Some minor variations that are not significant are commonly marked abnormal, but do not represent any medical problem for you.  Best regards, Doralee Kocak, M.D.

## 2020-09-25 ENCOUNTER — Encounter: Payer: Self-pay | Admitting: Family Medicine

## 2020-09-26 IMAGING — MG DIGITAL DIAGNOSTIC BILAT W/ TOMO W/ CAD
6 of 10 series · 6 of 30 positions shown · non-contrast
Comparison: Previous exam(s).

CLINICAL DATA: Focal pain in the right breast

EXAM:
DIGITAL DIAGNOSTIC RIGHT MAMMOGRAM WITH CAD AND TOMO
ULTRASOUND RIGHT BREAST

[L MLO synth-2D]
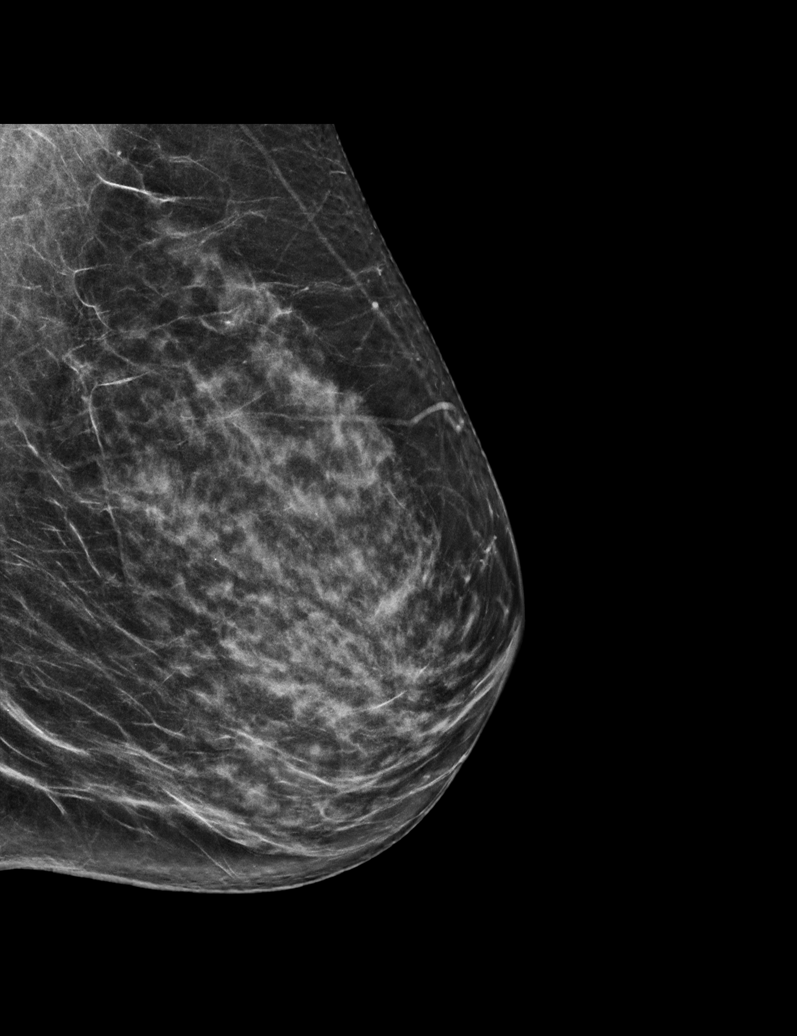

[R CC synth-2D]
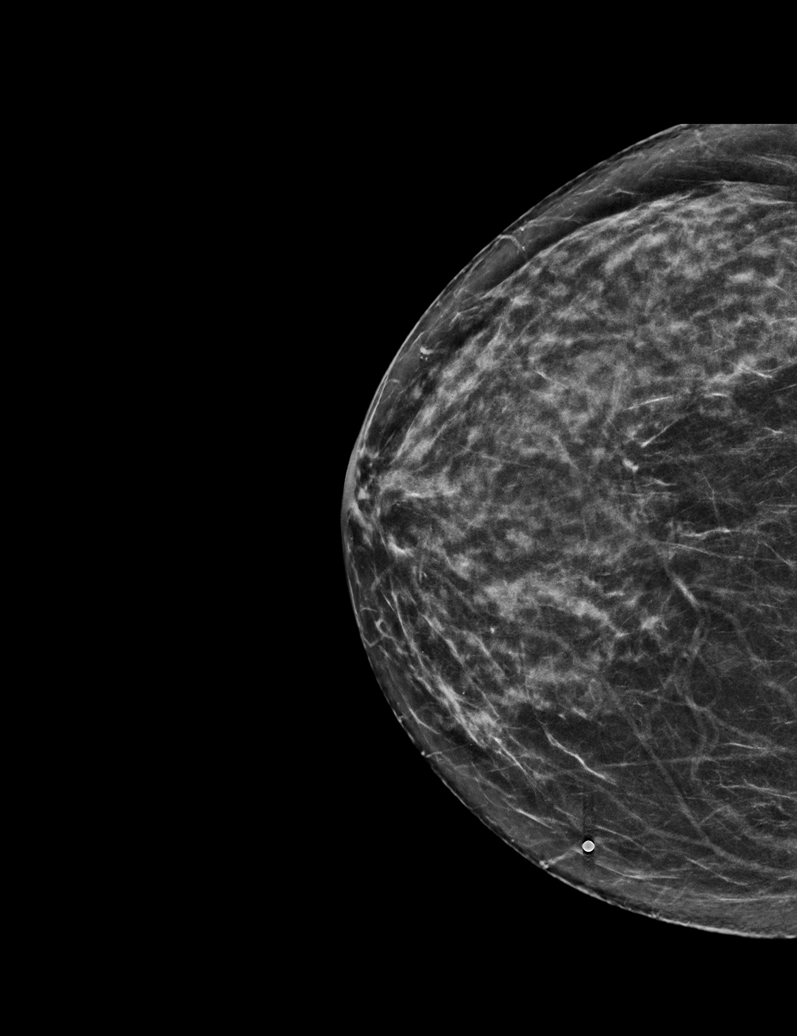

[L CC synth-2D]
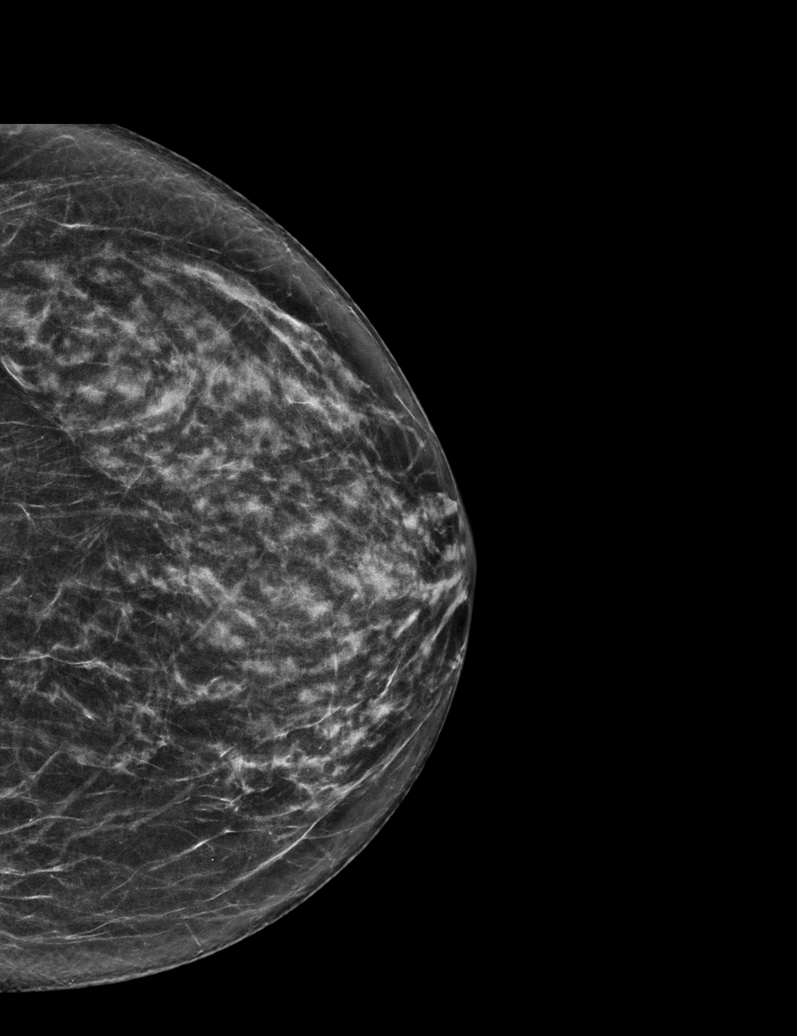

[R TAN synth-2D]
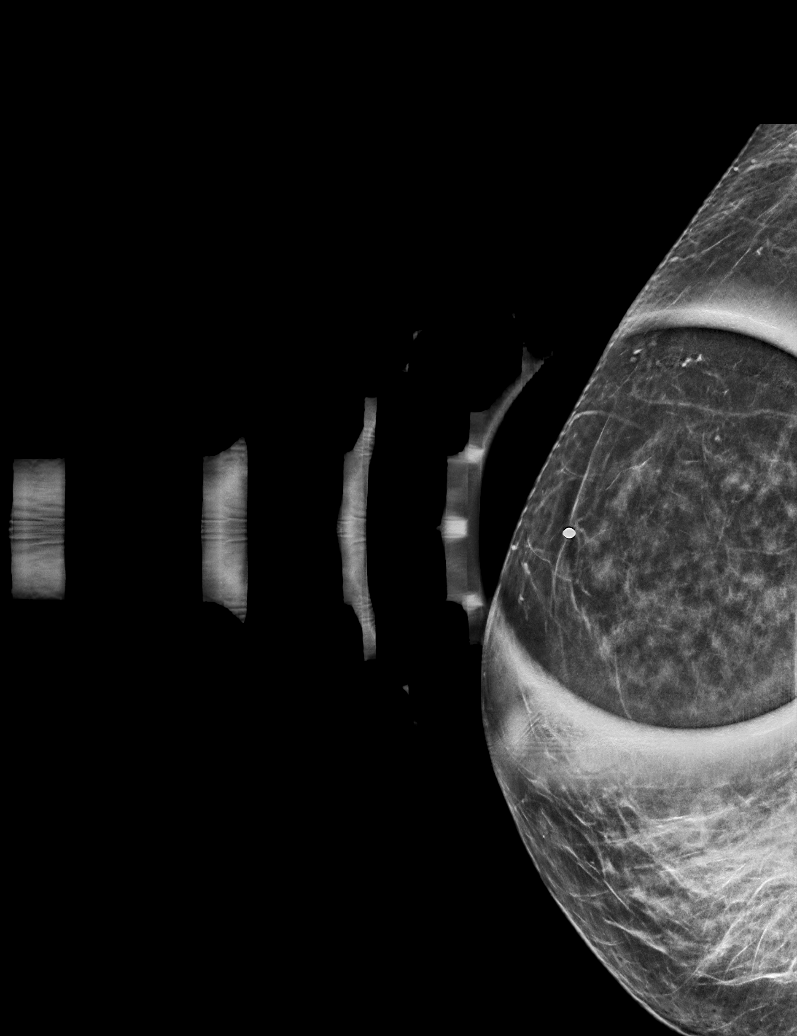

[R MLO synth-2D]
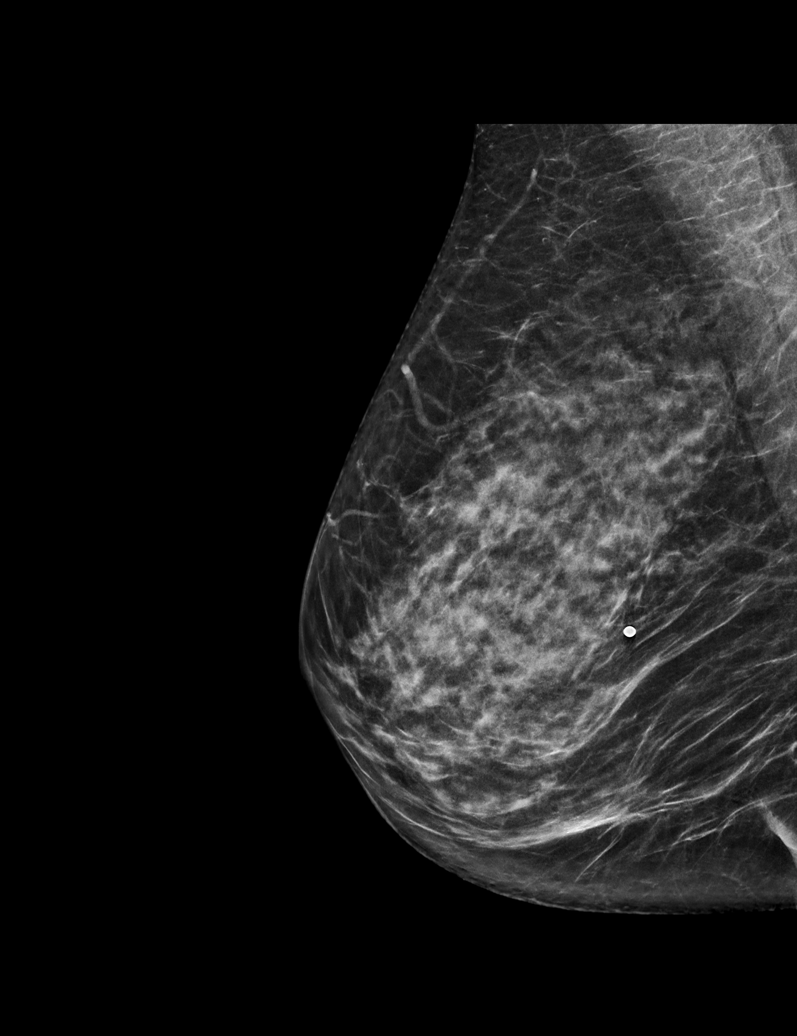

[R TAN tomo · tomo slice 27/54.0]
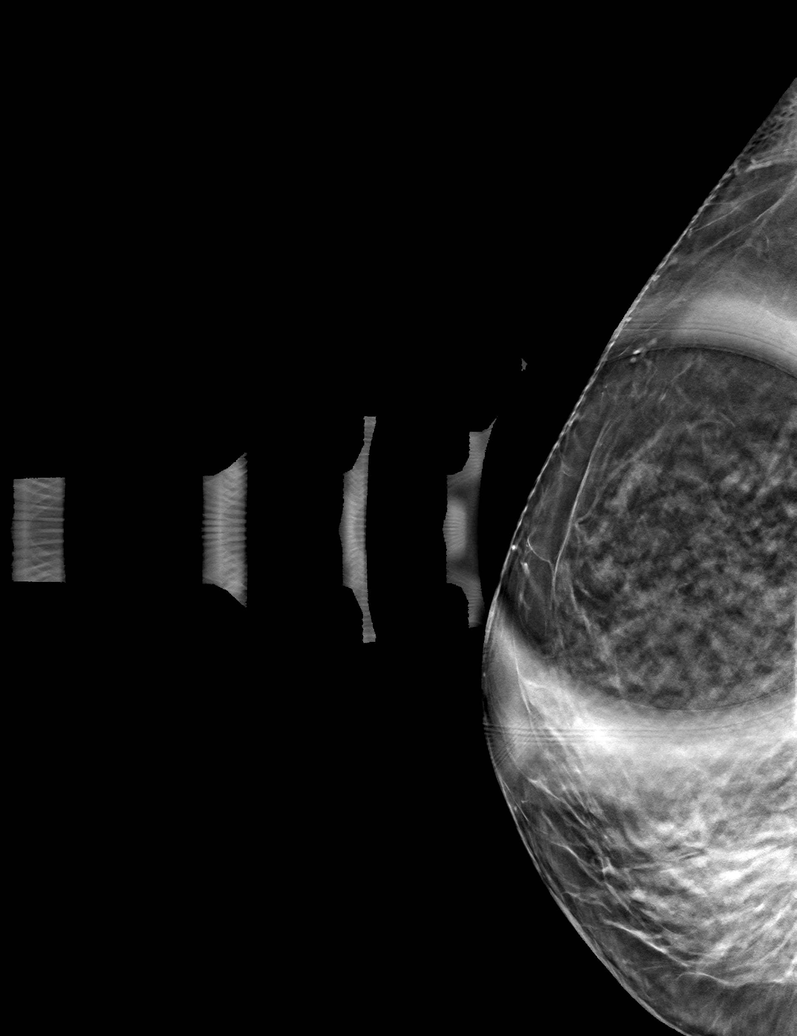

[6 of 30 positions shown; findings below may reference images not displayed]

ACR Breast Density Category c: The breast tissue is heterogeneously
dense, which may obscure small masses.
FINDINGS: No suspicious masses, calcifications, or distortion are seen in
either breast.

Mammographic images were processed with CAD.

On physical exam, no suspicious lumps are identified.

Targeted ultrasound is performed, showing no abnormalities in the
region of the focal pain on the right.
IMPRESSION: No mammographic or sonographic evidence of malignancy.

RECOMMENDATION:
Treatment of the patient's focal pain should be based on clinical
and physical exam given lack of imaging findings. Recommend annual
screening mammography.

I have discussed the findings and recommendations with the patient.
If applicable, a reminder letter will be sent to the patient
regarding the next appointment.

BI-RADS CATEGORY  1: Negative.

## 2020-09-27 ENCOUNTER — Other Ambulatory Visit: Payer: Self-pay | Admitting: Family Medicine

## 2020-09-27 DIAGNOSIS — Z1211 Encounter for screening for malignant neoplasm of colon: Secondary | ICD-10-CM

## 2020-11-06 ENCOUNTER — Ambulatory Visit: Admitting: Family Medicine

## 2020-11-14 ENCOUNTER — Other Ambulatory Visit: Payer: Self-pay | Admitting: Family Medicine

## 2020-11-14 DIAGNOSIS — E039 Hypothyroidism, unspecified: Secondary | ICD-10-CM

## 2020-11-18 ENCOUNTER — Ambulatory Visit (INDEPENDENT_AMBULATORY_CARE_PROVIDER_SITE_OTHER): Admitting: Family Medicine

## 2020-11-18 ENCOUNTER — Other Ambulatory Visit: Payer: Self-pay

## 2020-11-18 ENCOUNTER — Encounter: Payer: Self-pay | Admitting: Family Medicine

## 2020-11-18 VITALS — BP 119/73 | HR 63 | Temp 96.8°F | Ht 66.0 in | Wt 162.6 lb

## 2020-11-18 DIAGNOSIS — K1120 Sialoadenitis, unspecified: Secondary | ICD-10-CM | POA: Diagnosis not present

## 2020-11-18 MED ORDER — AMOXICILLIN-POT CLAVULANATE 875-125 MG PO TABS: 1.0000 | ORAL_TABLET | Freq: Two times a day (BID) | ORAL | 0 refills | Status: DC

## 2020-11-18 NOTE — Progress Notes (Signed)
Chief Complaint  Patient presents with  . Ear Pain    HPI  Patient presents today for pain under the jawline on the right that seems to radiate into her ear.  She has a history of sialolith on the right.  Sialoadenitis  PMH: Smoking status noted ROS: Per HPI  Objective: BP 119/73   Pulse 63   Temp (!) 96.8 F (36 C)   Ht 5\' 6"  (1.676 m)   Wt 162 lb 9.6 oz (73.8 kg)   LMP 07/01/1996   SpO2 98%   BMI 26.24 kg/m  Gen: NAD, alert, cooperative with exam HEENT: NCAT, EOMI, PERRL there is edema and tenderness of the right submandibular gland.  The oral cavity is unremarkable.  The TMs are clear. CV: RRR, good S1/S2, no murmur  Ext: No edema, warm Neuro: Alert and oriented, No gross deficits  Assessment and plan:  1. Sialoadenitis of submandibular gland     Meds ordered this encounter  Medications  . amoxicillin-clavulanate (AUGMENTIN) 875-125 MG tablet    Sig: Take 1 tablet by mouth 2 (two) times daily. Take all of this medication    Dispense:  20 tablet    Refill:  0    No orders of the defined types were placed in this encounter.   Follow up as needed.  Claretta Fraise, MD

## 2020-12-24 ENCOUNTER — Encounter: Payer: Self-pay | Admitting: *Deleted

## 2021-02-05 ENCOUNTER — Other Ambulatory Visit: Payer: Self-pay | Admitting: Family Medicine

## 2021-02-05 DIAGNOSIS — Z1231 Encounter for screening mammogram for malignant neoplasm of breast: Secondary | ICD-10-CM

## 2021-03-04 ENCOUNTER — Ambulatory Visit: Admitting: Family Medicine

## 2021-03-05 ENCOUNTER — Ambulatory Visit: Admitting: Family Medicine

## 2021-04-07 ENCOUNTER — Other Ambulatory Visit: Payer: Self-pay

## 2021-04-07 ENCOUNTER — Ambulatory Visit
Admission: RE | Admit: 2021-04-07 | Discharge: 2021-04-07 | Disposition: A | Discharge status: Home / Self Care | Source: Ambulatory Visit | Attending: Family Medicine | Admitting: Family Medicine

## 2021-04-07 DIAGNOSIS — Z1231 Encounter for screening mammogram for malignant neoplasm of breast: Secondary | ICD-10-CM

## 2021-05-15 ENCOUNTER — Other Ambulatory Visit: Payer: Self-pay | Admitting: Physician Assistant

## 2021-05-15 DIAGNOSIS — K1120 Sialoadenitis, unspecified: Secondary | ICD-10-CM

## 2021-05-31 DIAGNOSIS — K115 Sialolithiasis: Secondary | ICD-10-CM

## 2021-05-31 HISTORY — DX: Sialolithiasis: K11.5

## 2021-06-02 ENCOUNTER — Ambulatory Visit
Admission: RE | Admit: 2021-06-02 | Discharge: 2021-06-02 | Disposition: A | Discharge status: Home / Self Care | Source: Ambulatory Visit | Attending: Physician Assistant | Admitting: Physician Assistant

## 2021-06-02 DIAGNOSIS — K1120 Sialoadenitis, unspecified: Secondary | ICD-10-CM

## 2021-06-02 MED ORDER — IOPAMIDOL (ISOVUE-300) INJECTION 61%
75.0000 mL | Freq: Once | INTRAVENOUS | Status: AC | PRN
  Administered 2021-06-02: 75 mL via INTRAVENOUS

## 2021-06-06 ENCOUNTER — Emergency Department (HOSPITAL_COMMUNITY)

## 2021-06-06 ENCOUNTER — Inpatient Hospital Stay (HOSPITAL_COMMUNITY)
Admission: EM | Admit: 2021-06-06 | Discharge: 2021-06-11 | DRG: 139 | Disposition: A | Discharge status: Home / Self Care | Attending: Family Medicine | Admitting: Family Medicine

## 2021-06-06 ENCOUNTER — Other Ambulatory Visit: Payer: Self-pay

## 2021-06-06 DIAGNOSIS — Z833 Family history of diabetes mellitus: Secondary | ICD-10-CM | POA: Diagnosis not present

## 2021-06-06 DIAGNOSIS — R1310 Dysphagia, unspecified: Secondary | ICD-10-CM | POA: Diagnosis present

## 2021-06-06 DIAGNOSIS — Z818 Family history of other mental and behavioral disorders: Secondary | ICD-10-CM

## 2021-06-06 DIAGNOSIS — Z8249 Family history of ischemic heart disease and other diseases of the circulatory system: Secondary | ICD-10-CM | POA: Diagnosis not present

## 2021-06-06 DIAGNOSIS — R609 Edema, unspecified: Secondary | ICD-10-CM | POA: Diagnosis not present

## 2021-06-06 DIAGNOSIS — F32A Depression, unspecified: Secondary | ICD-10-CM | POA: Diagnosis not present

## 2021-06-06 DIAGNOSIS — M6748 Ganglion, other site: Secondary | ICD-10-CM | POA: Diagnosis not present

## 2021-06-06 DIAGNOSIS — Z8261 Family history of arthritis: Secondary | ICD-10-CM

## 2021-06-06 DIAGNOSIS — R110 Nausea: Secondary | ICD-10-CM | POA: Diagnosis not present

## 2021-06-06 DIAGNOSIS — Z808 Family history of malignant neoplasm of other organs or systems: Secondary | ICD-10-CM

## 2021-06-06 DIAGNOSIS — K115 Sialolithiasis: Principal | ICD-10-CM | POA: Diagnosis present

## 2021-06-06 DIAGNOSIS — Z823 Family history of stroke: Secondary | ICD-10-CM | POA: Diagnosis not present

## 2021-06-06 DIAGNOSIS — E039 Hypothyroidism, unspecified: Secondary | ICD-10-CM | POA: Diagnosis not present

## 2021-06-06 DIAGNOSIS — Z792 Long term (current) use of antibiotics: Secondary | ICD-10-CM | POA: Diagnosis not present

## 2021-06-06 DIAGNOSIS — I889 Nonspecific lymphadenitis, unspecified: Secondary | ICD-10-CM | POA: Diagnosis not present

## 2021-06-06 DIAGNOSIS — K1120 Sialoadenitis, unspecified: Secondary | ICD-10-CM | POA: Diagnosis present

## 2021-06-06 DIAGNOSIS — Z79890 Hormone replacement therapy (postmenopausal): Secondary | ICD-10-CM

## 2021-06-06 DIAGNOSIS — Z825 Family history of asthma and other chronic lower respiratory diseases: Secondary | ICD-10-CM

## 2021-06-06 DIAGNOSIS — Z8616 Personal history of COVID-19: Secondary | ICD-10-CM | POA: Diagnosis not present

## 2021-06-06 DIAGNOSIS — K1122 Acute recurrent sialoadenitis: Secondary | ICD-10-CM | POA: Diagnosis present

## 2021-06-06 DIAGNOSIS — Z20822 Contact with and (suspected) exposure to covid-19: Secondary | ICD-10-CM | POA: Diagnosis not present

## 2021-06-06 DIAGNOSIS — K1121 Acute sialoadenitis: Secondary | ICD-10-CM | POA: Diagnosis present

## 2021-06-06 DIAGNOSIS — K1123 Chronic sialoadenitis: Secondary | ICD-10-CM | POA: Diagnosis not present

## 2021-06-06 DIAGNOSIS — B9689 Other specified bacterial agents as the cause of diseases classified elsewhere: Secondary | ICD-10-CM | POA: Diagnosis present

## 2021-06-06 LAB — CBC
HCT: 35.4 % — ABNORMAL LOW (ref 36.0–46.0)
Hemoglobin: 11.8 g/dL — ABNORMAL LOW (ref 12.0–15.0)
MCH: 29.1 pg (ref 26.0–34.0)
MCHC: 33.3 g/dL (ref 30.0–36.0)
MCV: 87.2 fL (ref 80.0–100.0)
Platelets: 258 10*3/uL (ref 150–400)
RBC: 4.06 MIL/uL (ref 3.87–5.11)
RDW: 13 % (ref 11.5–15.5)
WBC: 7.2 10*3/uL (ref 4.0–10.5)
nRBC: 0 % (ref 0.0–0.2)

## 2021-06-06 LAB — CBC WITH DIFFERENTIAL/PLATELET
Abs Immature Granulocytes: 0.03 10*3/uL (ref 0.00–0.07)
Basophils Absolute: 0 10*3/uL (ref 0.0–0.1)
Basophils Relative: 0 %
Eosinophils Absolute: 0 10*3/uL (ref 0.0–0.5)
Eosinophils Relative: 0 %
HCT: 40.8 % (ref 36.0–46.0)
Hemoglobin: 13.6 g/dL (ref 12.0–15.0)
Immature Granulocytes: 0 %
Lymphocytes Relative: 15 %
Lymphs Abs: 1.1 10*3/uL (ref 0.7–4.0)
MCH: 29.1 pg (ref 26.0–34.0)
MCHC: 33.3 g/dL (ref 30.0–36.0)
MCV: 87.4 fL (ref 80.0–100.0)
Monocytes Absolute: 0.6 10*3/uL (ref 0.1–1.0)
Monocytes Relative: 8 %
Neutro Abs: 5.5 10*3/uL (ref 1.7–7.7)
Neutrophils Relative %: 77 %
Platelets: 275 10*3/uL (ref 150–400)
RBC: 4.67 MIL/uL (ref 3.87–5.11)
RDW: 13 % (ref 11.5–15.5)
WBC: 7.3 10*3/uL (ref 4.0–10.5)
nRBC: 0 % (ref 0.0–0.2)

## 2021-06-06 LAB — BASIC METABOLIC PANEL
Anion gap: 8 (ref 5–15)
BUN: 9 mg/dL (ref 8–23)
CO2: 25 mmol/L (ref 22–32)
Calcium: 9.4 mg/dL (ref 8.9–10.3)
Chloride: 106 mmol/L (ref 98–111)
Creatinine, Ser: 0.84 mg/dL (ref 0.44–1.00)
GFR, Estimated: >60 mL/min (ref >60)
Glucose, Bld: 102 mg/dL — ABNORMAL HIGH (ref 70–99)
Potassium: 4.2 mmol/L (ref 3.5–5.1)
Sodium: 139 mmol/L (ref 135–145)

## 2021-06-06 LAB — HIV ANTIBODY (ROUTINE TESTING W REFLEX): HIV Screen 4th Generation wRfx: NONREACTIVE

## 2021-06-06 LAB — CREATININE, SERUM
Creatinine, Ser: 0.83 mg/dL (ref 0.44–1.00)
GFR, Estimated: >60 mL/min (ref >60)

## 2021-06-06 LAB — RESP PANEL BY RT-PCR (FLU A&B, COVID) ARPGX2
Influenza A by PCR: NEGATIVE
Influenza B by PCR: NEGATIVE
SARS Coronavirus 2 by RT PCR: NEGATIVE

## 2021-06-06 MED ORDER — SODIUM CHLORIDE 0.9 % IV SOLN
3.0000 g | Freq: Four times a day (QID) | INTRAVENOUS | Status: DC
  Administered 2021-06-06 – 2021-06-11 (×19): 3 g via INTRAVENOUS
  Filled 2021-06-06 (×23): qty 8

## 2021-06-06 MED ORDER — IOHEXOL 300 MG/ML  SOLN
75.0000 mL | Freq: Once | INTRAMUSCULAR | Status: AC | PRN
  Administered 2021-06-06: 75 mL via INTRAVENOUS

## 2021-06-06 MED ORDER — CLINDAMYCIN PHOSPHATE 600 MG/50ML IV SOLN: 600.0000 mg | Freq: Once | INTRAVENOUS | Status: DC

## 2021-06-06 MED ORDER — DEXAMETHASONE SODIUM PHOSPHATE 10 MG/ML IJ SOLN
8.0000 mg | Freq: Three times a day (TID) | INTRAMUSCULAR | Status: DC
  Administered 2021-06-06 – 2021-06-09 (×10): 8 mg via INTRAVENOUS
  Administered 2021-06-10: 10 mg via INTRAVENOUS
  Administered 2021-06-10 – 2021-06-11 (×3): 8 mg via INTRAVENOUS
  Filled 2021-06-06 (×2): qty 0.8
  Filled 2021-06-06: qty 1
  Filled 2021-06-06 (×12): qty 0.8

## 2021-06-06 MED ORDER — MORPHINE SULFATE (PF) 2 MG/ML IV SOLN
2.0000 mg | INTRAVENOUS | Status: DC | PRN
  Administered 2021-06-06 – 2021-06-11 (×15): 2 mg via INTRAVENOUS
  Filled 2021-06-06 (×14): qty 1

## 2021-06-06 MED ORDER — DEXAMETHASONE SODIUM PHOSPHATE 10 MG/ML IJ SOLN
10.0000 mg | Freq: Once | INTRAMUSCULAR | Status: DC
  Filled 2021-06-06: qty 1

## 2021-06-06 MED ORDER — MORPHINE SULFATE (PF) 4 MG/ML IV SOLN
4.0000 mg | Freq: Once | INTRAVENOUS | Status: AC
  Administered 2021-06-06: 4 mg via INTRAVENOUS
  Filled 2021-06-06: qty 1

## 2021-06-06 MED ORDER — LACTATED RINGERS IV SOLN: INTRAVENOUS | Status: DC

## 2021-06-06 MED ORDER — ONDANSETRON HCL 4 MG/2ML IJ SOLN
4.0000 mg | Freq: Once | INTRAMUSCULAR | Status: AC
  Administered 2021-06-06: 4 mg via INTRAVENOUS
  Filled 2021-06-06: qty 2

## 2021-06-06 MED ORDER — DEXAMETHASONE SODIUM PHOSPHATE 10 MG/ML IJ SOLN
10.0000 mg | Freq: Once | INTRAMUSCULAR | Status: AC
  Administered 2021-06-06: 10 mg via INTRAVENOUS

## 2021-06-06 MED ORDER — MORPHINE SULFATE (PF) 4 MG/ML IV SOLN
4.0000 mg | Freq: Once | INTRAVENOUS | Status: AC
Start: 2021-06-06 — End: 2021-06-06
  Administered 2021-06-06: 4 mg via INTRAVENOUS
  Filled 2021-06-06: qty 1

## 2021-06-06 MED ORDER — ENOXAPARIN SODIUM 40 MG/0.4ML IJ SOSY
40.0000 mg | PREFILLED_SYRINGE | INTRAMUSCULAR | Status: DC
  Administered 2021-06-06 – 2021-06-09 (×4): 40 mg via SUBCUTANEOUS
  Filled 2021-06-06 (×3): qty 0.4

## 2021-06-06 NOTE — ED Triage Notes (Signed)
Pt with pain and swelling in throat x 8 weeks. Had CT scan on Monday for same but pain and swelling significantly worse since then. Unable to swallow this am.

## 2021-06-06 NOTE — ED Provider Notes (Signed)
[facility] EMERGENCY DEPARTMENT Provider Note   CSN: [id number] Arrival date & time: 06/06/21  2683     History No chief complaint on file.   Annette Silva is a 60 y.o. female who presents emergency department with chief complaint worsening right-sided pain and swelling.  Patient has a submandibular CLO adenitis which has been present for the past 8 weeks.  She has been seen in the outpatient setting by Dr. West Carbo at [facility] ear nose and throat.  Patient has completed a course of clindamycin had a recent CT scan.  Patient complains that pain has been severe and progressively worsening over the past week.  She has been unable to swallow liquids or tolerate food and has not been able to eat for the past 18 hours.   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     Family History  Problem Relation Age of Onset   Stroke Mother        passed at age 55   Arthritis Mother    COPD Mother    Depression Mother    Diabetes Mother    Hearing loss Mother    Heart disease Mother    Hypertension Mother    Cancer Father        bone passed at 62     Social History   Tobacco Use   Smoking status: Never   Smokeless tobacco: Never  Vaping Use   Vaping Use: Never used  Substance Use Topics   Alcohol use: Yes    Comment: socially   Drug use: No    Home Medications Prior to Admission medications   Medication Sig Start Date End Date Taking? Authorizing Provider  APPLE CIDER VINEGAR PO Take 2 tablets by mouth daily.   Yes [provider]  Calcium Carbonate-Vit D-Min (CALCIUM 1200 PO) Take 1 tablet by mouth daily.   Yes [provider]  ibuprofen (ADVIL) 200 MG tablet Take 200 mg by mouth every 6 (six) hours as needed for mild pain.   Yes [provider]  levothyroxine (SYNTHROID) 75 MCG tablet TAKE 1 TABLET ONCE DAILY BEFORE BREAKFAST Patient taking differently: Take 75 mcg by mouth daily. 11/14/20  Yes Stacks, Cletus Gash, MD  Multiple Vitamins-Minerals (CENTRUM SILVER ADULT 50+ PO) Take 1 tablet by mouth daily.   Yes [provider]  amoxicillin-clavulanate (AUGMENTIN) 875-125 MG tablet Take 1 tablet by mouth 2 (two) times daily. Take all of this medication Patient not taking: Reported on 06/06/2021 11/18/20  Claretta Fraise, MD  mirtazapine (REMERON) 7.5 MG tablet Take 1 tablet (7.5 mg total) by mouth at bedtime. For sleep Patient not taking: No sig reported 07/11/20   Claretta Fraise, MD  Omega-3 Fatty Acids (FISH OIL) 1200 MG CAPS Take 2 capsules by mouth daily. Patient not taking: Reported on 06/06/2021    [provider]  omeprazole (PRILOSEC) 40 MG capsule Take 1 capsule (40 mg total) by mouth daily. Patient not taking: Reported on 06/06/2021 05/03/20   Claretta Fraise, MD    Allergies    Benadryl [diphenhydramine hcl (sleep)]  Review of Systems   Review of Systems Ten systems reviewed and are negative for acute change, except as noted in the HPI.   Physical Exam Updated Vital Signs BP (!) 93/59   Pulse 80   Temp 98.8 F (37.1 C) (Oral)   Resp 16   LMP 07/01/1996   SpO2 95%    Physical Exam Vitals and nursing note reviewed.  Constitutional:      General: She is not in acute distress.    Appearance: She is well-developed. She is not diaphoretic.  HENT:     Head: Normocephalic and atraumatic.     Right Ear: External ear normal.     Left Ear: External ear normal.     Nose: Nose normal.     Mouth/Throat:     Mouth: Mucous membranes are moist.  Eyes:     General: No scleral icterus.    Conjunctiva/sclera: Conjunctivae normal.  Neck:     Comments: Significant pain and swelling along the angle of the mandible and tracking along the medial border of the right sternocleidomastoid muscle.  Area is exquisitely tender to palpation.  Patient has associated trismus and foul odor of breath. Cardiovascular:     Rate and Rhythm: Normal rate and regular rhythm.     Heart sounds: Normal heart sounds. No murmur heard.   No friction rub. No gallop.  Pulmonary:     Effort: Pulmonary effort is normal. No respiratory distress.     Breath sounds: Normal breath sounds.  Abdominal:     General: Bowel sounds are normal. There is no distension.     Palpations: Abdomen is soft. There is no mass.     Tenderness: There is no abdominal tenderness. There is no guarding.  Musculoskeletal:     Cervical back: Normal range of motion. Edema and tenderness present.  Skin:    General: Skin is warm and dry.  Neurological:     Mental Status: She is alert and oriented to person, place, and time.  Psychiatric:        Behavior: Behavior normal.    ED Results / Procedures / Treatments   Labs (all labs ordered are listed, but only abnormal results are displayed) Labs Reviewed  BASIC METABOLIC PANEL - Abnormal; Notable for the following components:      Result Value   Glucose, Bld 102 (*)    All other components within normal limits  RESP PANEL BY RT-PCR (FLU A&B, COVID) ARPGX2  CBC WITH DIFFERENTIAL/PLATELET  HIV ANTIBODY (ROUTINE TESTING W REFLEX)  CBC  CREATININE, SERUM     EKG None  Radiology CT Soft Tissue Neck W Contrast  Result Date: 06/06/2021 CLINICAL DATA:  Foreign body suspected, unable to swallow, silo adenitis of the right submandibular gland EXAM: CT NECK WITH CONTRAST TECHNIQUE: Multidetector CT imaging of the neck was performed using the standard protocol following the bolus administration of intravenous contrast. CONTRAST:  15mL OMNIPAQUE  IOHEXOL 300 MG/ML  SOLN COMPARISON:  06/02/2021. FINDINGS: Pharynx and larynx: Mild mass effect from edema related to the known right submandibular sialadenitis, with leftward deviation of the hypopharynx and edema extending into the epiglottis, with effacement of the right vallecula and piriform recess. Salivary glands: The parotid glands and left submandibular gland are normal. 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 Unchanged appearance of the dominant stone within the submandibular gland, which measures 11 mm, with additional more distal 5 mm stone in the duct. Possible low-density collection with mild surrounding enhancement inferior to the dominant stone (series 100, image 44), which was not present on the prior exam, measuring approximately 1.6 x 2.3 x 1.8 cm. Increased edema and fat stranding about the right submandibular gland, particularly along the inferior aspect Thyroid: Normal. Lymph nodes: Prominent right level 1A, 1B, and 2A lymph nodes, likely reactive. Vascular: Negative. Limited intracranial: Negative. Visualized orbits: Not included in the field of view. Mastoids and visualized paranasal sinuses: Clear. Skeleton: No acute osseous abnormality. Upper chest: Negative. Other: None. IMPRESSION: 1. Redemonstrated right sialadenitis, with increased associated edema. Possible low-density collection with surrounding enhancement inferior to the dominant stone, concerning for phlegmon or developing  abscess. Increased associated fat stranding and superficial edema. 2. Edema from aforementioned sialadenitis extends into the hypopharynx and causes leftward deviation of the airway, without significant airway narrowing. These results were called by telephone at the time of interpretation on 06/06/2021 at 1:50 pm to provider Tommaso Cavitt , who verbally acknowledged these results. Electronically Signed   By: Merilyn Baba M.D.   On: 06/06/2021 13:52    Procedures Procedures   Medications Ordered in ED Medications  Ampicillin-Sulbactam (UNASYN) 3 g in sodium chloride 0.9 % 100 mL IVPB (3 g Intravenous New Bag/Given 06/06/21 1523)  dexamethasone (DECADRON) injection 8 mg (has no administration in time range)  enoxaparin (LOVENOX) injection 40 mg (has no administration in time range)  lactated ringers infusion (has no administration in time range)  morphine 2 MG/ML injection 2 mg (has no administration in time range)  morphine 4 MG/ML injection 4 mg (4 mg Intravenous Given 06/06/21 1109)  ondansetron (ZOFRAN) injection 4 mg (4 mg Intravenous Given 06/06/21 1109)  iohexol (OMNIPAQUE) 300 MG/ML solution 75 mL (75 mLs Intravenous Contrast Given 06/06/21 1303)  morphine 4 MG/ML injection 4 mg (4 mg Intravenous Given 06/06/21 1454)  dexamethasone (DECADRON) injection 10 mg (10 mg Intravenous Given 06/06/21 1520)    ED Course  I have reviewed the triage vital signs and the nursing notes.  Pertinent labs & imaging results that were available during my care of the patient were reviewed by me and considered in my medical decision making (see chart for details).    MDM Rules/Calculators/A&P                           60 year old female here with worsening CLO adenitis of the submandibular gland on the right side. I ordered and reviewed labs that includes a CBC without abnormality, BMP with only mildly elevated glucose, COVID panel currently pending.  I ordered and reviewed images of CT soft tissue of the  neck with contrast.  Which shows worsening swelling and potential development of phlegmon versus abscess with extension to the hypopharynx and deviation leftward of the airway.  I discussed the case with Dr. Isaias Cowman of ENT who recommends admission for pain control  and hydration along with 10 mg of IV Decadron and 8 mg of Decadron every 8 hours.  Patient is also on Unasyn which she may continue.  She recommends nonsugary sialogogues such as lemon slices or sugar-free sour candies to encourage salivation.  Final Clinical Impression(s) / ED Diagnoses Final diagnoses:  Infectious sialoadenitis of major salivary gland    Rx / DC Orders ED Discharge Orders     None        Margarita Mail, PA-C 06/06/21 1625    Regan Lemming, MD 06/06/21 1857

## 2021-06-06 NOTE — ED Provider Notes (Addendum)
Emergency Medicine Provider Triage Evaluation Note  Emmalise Huard , a 60 y.o. female  was evaluated in triage.  Pt complains of severe facial swelling, worsening pain rapidly over the past week.  Recent CT maxillofacial.  Severe pain with swallowing.  Unable to wait for repeat evaluation this coming Monday due to progressive worsening of situation.  She has known sialoadenitis which she has been dealing with for 8 weeks..  Review of Systems  Positive: Right-sided parotitis and swelling Negative: Fever  Physical Exam  BP (!) 132/95 (BP Location: Right Arm)   Pulse 93   Resp 16   LMP 07/01/1996   SpO2 100%  Gen:   Awake, tearful   Resp:  Normal effort  MSK:   Moves extremities without difficulty  Other:  Significant swelling of the right parotid gland, swelling to the anterior neck down the SCM.  Severe pain.  Trismus, foul odor  Medical Decision Making  Medically screening exam initiated at 10:52 AM.  Appropriate orders placed.  Ronn Melena Laborde was informed that the remainder of the evaluation will be completed by another provider, this initial triage assessment does not replace that evaluation, and the importance of remaining in the ED until their evaluation is complete.  Patient given pain medication, repeat CT soft tissue of the neck ordered.   Margarita Mail, PA-C 06/06/21 1057    Regan Lemming, MD 06/06/21 1107    Margarita Mail, PA-C 06/06/21 1351    Regan Lemming, MD 06/06/21 [phone]

## 2021-06-06 NOTE — H&P (Signed)
History and Physical:    Annette Silva   ZOX:[id number] DOB: 1961-07-11 DOA: 06/06/2021  Referring MD/provider: Lawanda Cousins PCP: Adaline Sill, NP   Patient coming from: Home  Chief Complaint: Inability to swallow/choking on water over the past day  History of Present Illness:   Annette Silva is an 60 y.o. female with hypothyroidism with PMH significant for right submandibular sialadenitis secondary to 2 large 11 mm and 6 mm stones which has been managed as an outpatient by ENT.  She has been treated intermittently with antibiotics and warm compresses and sucking on multiple lemons.  However stones have not passed.  Over the past 2 to 3 days patient has noted that she feels worse with decreased ability to keep p.o.'s.  Last night she said she choked on water.  Admits to some mild chills, no rigors.  No difficulty speaking or managing secretions.  ED Course:  The patient was afebrile with normal vital signs.  CT scan of neck reveals increased edema with stranding as well as possible development of phlegmon versus abscess.  Associated edema apparently extends into the hypopharynx and causes some leftward deviation of the airway without airway narrowing.    PA Harris discussed the patient with ENT Dr. Isaias Cowman and her note is as follows: "I discussed the case with Dr. Isaias Cowman of ENT who recommends admission for pain control and hydration along with 10 mg of IV Decadron and 8 mg of Decadron every 8 hours.  Patient is also on Unasyn which she may continue." ENT will provide a formal consultation later.  Patient was started on Decadron and Unasyn as recommended.  ROS:   ROS   Review of Systems: General: Denies fever, chills, malaise,  Eyes: Denies recent change in vision, no discharge, redness, pain noted Endocrine: Denies heat/cold intolerance, polyuria or weight loss. Respiratory: Denies cough, SOB at rest or hemoptysis Cardiovascular: Denies chest pain or  palpitations GI: Denies nausea, vomiting, diarrhea or constipation GU: Denies dysuria, frequency or hematuria CNS: Denies HA, dizziness, confusion, new weakness or clumsiness. Blood/lymphatics: Denies easy bruising or bleeding   Past Medical History:   Past Medical History:  Diagnosis Date   Allergy    Anxiety    COVID-19 virus infection 07/2019   hx of   Depression    Mini stroke ([place]) 5/ 2015   Salivary stones    Thyroid disease     Past Surgical History:   Past Surgical History:  Procedure Laterality Date   ABDOMINAL HYSTERECTOMY  11 / 1997   partial    CESAREAN SECTION  1993   TOOTH EXTRACTION Left     Social History:   Social History   Socioeconomic History   Marital status: Married    Spouse name: Eddie Dibbles   Number of children: Not on file   Years of education: Not on file   Highest education level: High school graduate  Occupational History   Not on file  Tobacco Use   Smoking status: Never   Smokeless tobacco: Never  Vaping Use   Vaping Use: Never used  Substance and Sexual Activity   Alcohol use: Yes    Comment: socially   Drug use: No   Sexual activity: Never  Other Topics Concern   Not on file  Social History Narrative   Lives with husband   Caffeine- coffee 4 c daily   Social Determinants of Health   Financial Resource Strain: Not on file  Food Insecurity: Not on file  Transportation Needs: Not on file  Physical Activity: Not on file  Stress: Not on file  Social Connections: Not on file  Intimate Partner Violence: Not on file    Allergies   Benadryl [diphenhydramine hcl (sleep)]  Family history:   Family History  Problem Relation Age of Onset   Stroke Mother        passed at age 10   Arthritis Mother    COPD Mother    Depression Mother    Diabetes Mother    Hearing loss Mother    Heart disease Mother    Hypertension Mother    Cancer Father        bone passed at 73    Current Medications:   Prior to Admission  medications   Medication Sig Start Date End Date Taking? Authorizing Provider  amoxicillin-clavulanate (AUGMENTIN) 875-125 MG tablet Take 1 tablet by mouth 2 (two) times daily. Take all of this medication 11/18/20   Claretta Fraise, MD  Calcium Carbonate-Vit D-Min (CALCIUM 1200 PO) Take by mouth.    [provider]  Cholecalciferol (D3 ADULT PO) Take 800 mg by mouth daily.    [provider]  Ibuprofen (ADVIL PO) Take by mouth as needed.    [provider]  levothyroxine (SYNTHROID) 75 MCG tablet TAKE 1 TABLET ONCE DAILY BEFORE BREAKFAST 11/14/20   Claretta Fraise, MD  mirtazapine (REMERON) 7.5 MG tablet Take 1 tablet (7.5 mg total) by mouth at bedtime. For sleep Patient not taking: No sig reported 07/11/20   Claretta Fraise, MD  Multiple Vitamins-Minerals (CENTRUM SILVER ADULT 50+ PO) Take 1 tablet by mouth daily.    [provider]  Omega-3 Fatty Acids (FISH OIL) 1200 MG CAPS Take 2 capsules by mouth daily.    [provider]  omeprazole (PRILOSEC) 40 MG capsule Take 1 capsule (40 mg total) by mouth daily. Patient taking differently: Take 40 mg by mouth daily as needed. 05/03/20   Claretta Fraise, MD  vitamin B-12 (CYANOCOBALAMIN) 1000 MCG tablet Take 1,000 mcg by mouth daily.    [provider]    Physical Exam:   Vitals:   06/06/21 1048 06/06/21 1055 06/06/21 1453 06/06/21 1500  BP: (!) 132/95  119/73 (!) 93/59  Pulse: 93  80 80  Resp: 16  (!) 21 16  Temp:  98.8 F (37.1 C)    TempSrc:  Oral    SpO2: 100%  97% 95%     Physical Exam: Blood pressure (!) 93/59, pulse 80, temperature 98.8 F (37.1 C), temperature source Oral, resp. rate 16, last menstrual period 07/01/1996, SpO2 95 %. Gen: Relatively comfortable appearing female with clear swelling of right submandibular area in NAD, able to speak in full sentences, managing secretions without any difficulty, no stridor, no respiratory distress. HEENT: sclera anicteric, conjuctiva mildly  injected bilaterally, some trismus with attempts to open mouth, unable to see oropharynx or submandibular area due to difficulty opening mouth. CVS: S1-S2, regulary, no gallops Respiratory: CTA  GI: NABS, soft, NT  LE: No edema. No cyanosis Neuro: A/O x 3, Moving all extremities equally with normal strength, CN 3-12 intact, grossly nonfocal.  Psych: patient is logical and coherent, judgement and insight appear normal, mood and affect appropriate to situation. Skin: no rashes or lesions or ulcers,    Data Review:    Labs: Basic Metabolic Panel: Recent Labs  Lab 06/06/21 1053  NA 139  K 4.2  CL 106  CO2 25  GLUCOSE 102*  BUN 9  CREATININE 0.84  CALCIUM 9.4   Liver Function Tests: No results for input(s): AST, ALT, ALKPHOS, BILITOT, PROT, ALBUMIN in the last 168 hours. No results for input(s): LIPASE, AMYLASE in the last 168 hours. No results for input(s): AMMONIA in the last 168 hours. CBC: Recent Labs  Lab 06/06/21 1053  WBC 7.3  NEUTROABS 5.5  HGB 13.6  HCT 40.8  MCV 87.4  PLT 275   Cardiac Enzymes: No results for input(s): CKTOTAL, CKMB, CKMBINDEX, TROPONINI in the last 168 hours.  BNP (last 3 results) No results for input(s): PROBNP in the last 8760 hours. CBG: No results for input(s): GLUCAP in the last 168 hours.  Urinalysis    Component Value Date/Time   APPEARANCEUR Clear 10/14/2018 1202   GLUCOSEU Negative 10/14/2018 1202   BILIRUBINUR Negative 10/14/2018 1202   PROTEINUR Negative 10/14/2018 1202   NITRITE Negative 10/14/2018 1202   LEUKOCYTESUR Negative 10/14/2018 1202      Radiographic Studies: CT Soft Tissue Neck W Contrast  Result Date: 06/06/2021 CLINICAL DATA:  Foreign body suspected, unable to swallow, silo adenitis of the right submandibular gland EXAM: CT NECK WITH CONTRAST TECHNIQUE: Multidetector CT imaging of the neck was performed using the standard protocol following the bolus administration of intravenous contrast. CONTRAST:   110mL OMNIPAQUE IOHEXOL 300 MG/ML  SOLN COMPARISON:  06/02/2021. FINDINGS: Pharynx and larynx: Mild mass effect from edema related to the known right submandibular sialadenitis, with leftward deviation of the hypopharynx and edema extending into the epiglottis, with effacement of the right vallecula and piriform recess. Salivary glands: The parotid glands and left submandibular gland are normal. 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 Unchanged appearance of the dominant stone within the submandibular gland, which measures 11 mm, with additional more distal 5 mm stone in the duct. Possible low-density collection with mild surrounding enhancement inferior to the dominant stone (series 100, image 44), which was not present on the prior exam, measuring approximately 1.6 x 2.3 x 1.8 cm. Increased edema and fat stranding about the right submandibular gland, particularly along the inferior aspect Thyroid: Normal. Lymph nodes: Prominent right level 1A, 1B, and 2A lymph nodes, likely reactive. Vascular: Negative. Limited intracranial: Negative. Visualized orbits: Not included in the field of view. Mastoids and visualized paranasal sinuses: Clear. Skeleton: No acute osseous abnormality. Upper chest: Negative. Other: None. IMPRESSION: 1. Redemonstrated right sialadenitis, with increased associated edema. Possible low-density collection with surrounding enhancement inferior to the dominant stone, concerning for phlegmon or developing abscess. Increased associated fat stranding and superficial edema. 2. Edema from aforementioned sialadenitis extends into the hypopharynx and causes leftward deviation of the airway, without significant airway narrowing. These results were called by telephone at the time of interpretation on 06/06/2021 at 1:50 pm to provider ABIGAIL HARRIS , who verbally acknowledged these results.  Electronically Signed   By: Merilyn Baba M.D.   On: 06/06/2021 13:52    EKG: Independently reviewed.  Ordered and pending   Assessment/Plan:   Active Problems:   Sialolithiasis of submandibular gland   Acute bacterial sialadenitis   60 year old female with 8 weeks conservative management of right submandibular sialadenitis with impacted 11 mm stone now presents with inability to swallow and possible development of abscess versus phlegmon.  Right submandibular sialadenitis with impacted 11 mm stone Per ENT recommendations, patient on Decadron 8mg  every 8h and Unasyn 3 g every 6.   At this point they are not recommending  any surgical attempt at stone removal, she may possibly need lithotripsy which would need to be done at [facility]. Although she has some deviation of airway, patient appears very comfortable and there is no airway narrowing on CT.  Decreased p.o. intake Patient said she choked on water earlier, hopefully Decadron will help, will place patient as n.p.o. with sips of water and clear liquids if she tolerates it. LR at 125 cc x 48 hours  Hypothyroidism Hold Synthroid, patient states she was afraid she would choke if she took it. N.p.o. for anything other than liquids if she tolerates it.    Other information:   DVT prophylaxis: Lovenox ordered. Code Status: Full Family Communication: Patient's husband was at bedside Disposition Plan: Home Consults called: ENT Dr. Isaias Cowman Admission status: Observation  Dewaine Oats Derek [facility]  If 7PM-7AM, please contact night-coverage [email or web address]

## 2021-06-06 NOTE — Consult Note (Signed)
ENT CONSULT:  Reason for Consult: Sialoadenitis, sialolithiasis  Referring Physician:  Margarita Mail, PA_C  HPI: Annette Silva is an 60 y.o. female who presents to ED with complaints of right submandibular sialoadenitis in setting of known sialolithiasis.  Patient reports acute worsening of her symptoms, with decreased ability to tolerate p.o. intake, prompting her to seek evaluation in the ED.  While in the ED, CT neck with contrast was performed, which demonstrated increased edema with fat stranding and possible phlegmon as well as two large stones in the right submandibular gland.  Patient is to be admitted for medical management by hospital medicine.  ENT consulted to evaluate.  Patient was recently seen in our office by Pietro Cassis, PA, and treated with a course of oral clindamycin.  Patient had outpatient CT neck with contrast on 06/02/2021, and is scheduled to see Dr. Constance Holster for surgical consultation on Monday 10/10.  She reports that she has been dealing with intermittent flareups of the right submandibular gland for the last 2 months.   Past Medical History:  Diagnosis Date   Allergy    Anxiety    COVID-19 virus infection 07/2019   hx of   Depression    Mini stroke ([place]) 5/ 2015   Salivary stones    Thyroid disease     Past Surgical History:  Procedure Laterality Date   ABDOMINAL HYSTERECTOMY  11 / 1997   partial    CESAREAN SECTION  1993   TOOTH EXTRACTION Left     Family History  Problem Relation Age of Onset   Stroke Mother        passed at age 37   Arthritis Mother    COPD Mother    Depression Mother    Diabetes Mother    Hearing loss Mother    Heart disease Mother    Hypertension Mother    Cancer Father        bone passed at 36    Social History:  reports that she has never smoked. She has never used smokeless tobacco. She reports current alcohol use. She reports that she does not use drugs.  Allergies:  Allergies  Allergen Reactions   Benadryl  [Diphenhydramine Hcl (Sleep)]     HYPER    Medications: I have reviewed the patient's current medications.  Results for orders placed or performed during the hospital encounter of 06/06/21 (from the past 48 hour(s))  CBC with Differential     Status: None   Collection Time: 06/06/21 10:53 AM  Result Value Ref Range   WBC 7.3 4.0 - 10.5 K/uL   RBC 4.67 3.87 - 5.11 MIL/uL   Hemoglobin 13.6 12.0 - 15.0 g/dL   HCT 40.8 36.0 - 46.0 %   MCV 87.4 80.0 - 100.0 fL   MCH 29.1 26.0 - 34.0 pg   MCHC 33.3 30.0 - 36.0 g/dL   RDW 13.0 11.5 - 15.5 %   Platelets 275 150 - 400 K/uL   nRBC 0.0 0.0 - 0.2 %   Neutrophils Relative % 77 %   Neutro Abs 5.5 1.7 - 7.7 K/uL   Lymphocytes Relative 15 %   Lymphs Abs 1.1 0.7 - 4.0 K/uL   Monocytes Relative 8 %   Monocytes Absolute 0.6 0.1 - 1.0 K/uL   Eosinophils Relative 0 %   Eosinophils Absolute 0.0 0.0 - 0.5 K/uL   Basophils Relative 0 %   Basophils Absolute 0.0 0.0 - 0.1 K/uL   Immature Granulocytes 0 %  Abs Immature Granulocytes 0.03 0.00 - 0.07 K/uL    Comment: Performed at [facility], [place] [address], [place], [place] [address]  Basic metabolic panel     Status: Abnormal   Collection Time: 06/06/21 10:53 AM  Result Value Ref Range   Sodium 139 135 - 145 mmol/L   Potassium 4.2 3.5 - 5.1 mmol/L   Chloride 106 98 - 111 mmol/L   CO2 25 22 - 32 mmol/L   Glucose, Bld 102 (H) 70 - 99 mg/dL    Comment: Glucose reference range applies only to samples taken after fasting for at least 8 hours.   BUN 9 8 - 23 mg/dL   Creatinine, Ser 0.84 0.44 - 1.00 mg/dL   Calcium 9.4 8.9 - 10.3 mg/dL   GFR, Estimated >60 >60 mL/min    Comment: (NOTE) Calculated using the CKD-EPI Creatinine Equation (2021)    Anion gap 8 5 - 15    Comment: Performed at [facility] [address] [place], [place] [address]    CT Soft Tissue Neck W Contrast  Result Date: 06/06/2021 CLINICAL DATA:  Foreign body suspected, unable to swallow, silo adenitis of the  right submandibular gland EXAM: CT NECK WITH CONTRAST TECHNIQUE: Multidetector CT imaging of the neck was performed using the standard protocol following the bolus administration of intravenous contrast. CONTRAST:  19mL OMNIPAQUE IOHEXOL 300 MG/ML  SOLN COMPARISON:  06/02/2021. FINDINGS: Pharynx and larynx: Mild mass effect from edema related to the known right submandibular sialadenitis, with leftward deviation of the hypopharynx and edema extending into the epiglottis, with effacement of the right vallecula and piriform recess. Salivary glands: The parotid glands and left submandibular gland are normal. 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 Unchanged appearance of the dominant stone within the submandibular gland, which measures 11 mm, with additional more distal 5 mm stone in the duct. Possible low-density collection with mild surrounding enhancement inferior to the dominant stone (series 100, image 44), which was not present on the prior exam, measuring approximately 1.6 x 2.3 x 1.8 cm. Increased edema and fat stranding about the right submandibular gland, particularly along the inferior aspect Thyroid: Normal. Lymph nodes: Prominent right level 1A, 1B, and 2A lymph nodes, likely reactive. Vascular: Negative. Limited intracranial: Negative. Visualized orbits: Not included in the field of view. Mastoids and visualized paranasal sinuses: Clear. Skeleton: No acute osseous abnormality. Upper chest: Negative. Other: None. IMPRESSION: 1. Redemonstrated right sialadenitis, with increased associated edema. Possible low-density collection with surrounding enhancement inferior to the dominant stone, concerning for phlegmon or developing abscess. Increased associated fat stranding and superficial edema. 2. Edema from aforementioned sialadenitis extends into the hypopharynx and causes leftward deviation of  the airway, without significant airway narrowing. These results were called by telephone at the time of interpretation on 06/06/2021 at 1:50 pm to provider ABIGAIL HARRIS , who verbally acknowledged these results. Electronically Signed   By: Merilyn Baba M.D.   On: 06/06/2021 13:52    ROS:ROS  Blood pressure (!) 93/59, pulse 80, temperature 98.8 F (37.1 C), temperature source Oral, resp. rate 16, last menstrual period 07/01/1996, SpO2 95 %.  PHYSICAL EXAM: CONSTITUTIONAL: well developed, nourished, no acute distress and alert and oriented x 3 HENT: Head : normocephalic and atraumatic Ears: Right ear:   canal normal, external ear normal and hearing normal Left ear:   canal normal, external ear normal and hearing normal  Nose: nose normal and no purulence Mouth/Throat:  Mouth: Exam limited to patient discomfort/trismus. Floor mouth with no significant edema, no induration, no palpable stone near punctum, no saliva able to be expressed from right Wharton's duct.  Left duct patent, draining clear saliva. Throat: oropharynx clear and moist Mucous membranes: normal EYES: conjunctiva normal, EOM normal and PERRL NECK: Right semitubular gland with significant induration and edema, no erythema of the overlying skin, no palpable fluctuance.  Studies Reviewed: CT neck with contrast reviewed.  Consistent with acute right sialoadenitis in setting of sialolithiasis.  No focal abscess appreciated.   Assessment/Plan: Annette Silva is a 60 y/o female acute right submandibular  sialoadenitis in the setting of sialolithiasis, presenting for evaluation following increased pain and odynophagia. -Agree with admission for IV fluids, antibiotics, pain control -Continue IV antibiotics. Recommend IV Decadron 8mg  Q8H for 3 total doses. -Recommend application of warm compresses and gentle massage to affected area.  -Recommend aggressive oral hydration once tolerating PO, with frequent use of  sialogogues -Outpatient follow up with Dr. Constance Holster as scheduled on Monday 10/10 to discuss definitive surgical management.   Thank you for allowing me to participate in the care of this patient. Please do not hesitate to contact me with any questions or concerns.   Jason Coop, [facility] Cell: [phone]   06/06/2021, 4:34 PM

## 2021-06-07 DIAGNOSIS — K1121 Acute sialoadenitis: Secondary | ICD-10-CM

## 2021-06-07 DIAGNOSIS — Z823 Family history of stroke: Secondary | ICD-10-CM | POA: Diagnosis not present

## 2021-06-07 DIAGNOSIS — R1310 Dysphagia, unspecified: Secondary | ICD-10-CM | POA: Diagnosis present

## 2021-06-07 DIAGNOSIS — Z808 Family history of malignant neoplasm of other organs or systems: Secondary | ICD-10-CM | POA: Diagnosis not present

## 2021-06-07 DIAGNOSIS — Z825 Family history of asthma and other chronic lower respiratory diseases: Secondary | ICD-10-CM | POA: Diagnosis not present

## 2021-06-07 DIAGNOSIS — K1122 Acute recurrent sialoadenitis: Secondary | ICD-10-CM | POA: Diagnosis present

## 2021-06-07 DIAGNOSIS — R609 Edema, unspecified: Secondary | ICD-10-CM | POA: Diagnosis present

## 2021-06-07 DIAGNOSIS — Z8261 Family history of arthritis: Secondary | ICD-10-CM | POA: Diagnosis not present

## 2021-06-07 DIAGNOSIS — Z833 Family history of diabetes mellitus: Secondary | ICD-10-CM | POA: Diagnosis not present

## 2021-06-07 DIAGNOSIS — K115 Sialolithiasis: Secondary | ICD-10-CM | POA: Diagnosis present

## 2021-06-07 DIAGNOSIS — I889 Nonspecific lymphadenitis, unspecified: Secondary | ICD-10-CM | POA: Diagnosis present

## 2021-06-07 DIAGNOSIS — Z8616 Personal history of COVID-19: Secondary | ICD-10-CM | POA: Diagnosis not present

## 2021-06-07 DIAGNOSIS — F32A Depression, unspecified: Secondary | ICD-10-CM | POA: Diagnosis present

## 2021-06-07 DIAGNOSIS — Z79890 Hormone replacement therapy (postmenopausal): Secondary | ICD-10-CM | POA: Diagnosis not present

## 2021-06-07 DIAGNOSIS — B9689 Other specified bacterial agents as the cause of diseases classified elsewhere: Secondary | ICD-10-CM | POA: Diagnosis not present

## 2021-06-07 DIAGNOSIS — M6748 Ganglion, other site: Secondary | ICD-10-CM | POA: Diagnosis present

## 2021-06-07 DIAGNOSIS — Z20822 Contact with and (suspected) exposure to covid-19: Secondary | ICD-10-CM | POA: Diagnosis present

## 2021-06-07 DIAGNOSIS — K1123 Chronic sialoadenitis: Secondary | ICD-10-CM | POA: Diagnosis present

## 2021-06-07 DIAGNOSIS — K1120 Sialoadenitis, unspecified: Secondary | ICD-10-CM | POA: Diagnosis present

## 2021-06-07 DIAGNOSIS — E039 Hypothyroidism, unspecified: Secondary | ICD-10-CM | POA: Diagnosis present

## 2021-06-07 DIAGNOSIS — R110 Nausea: Secondary | ICD-10-CM | POA: Diagnosis present

## 2021-06-07 DIAGNOSIS — Z792 Long term (current) use of antibiotics: Secondary | ICD-10-CM | POA: Diagnosis not present

## 2021-06-07 DIAGNOSIS — Z818 Family history of other mental and behavioral disorders: Secondary | ICD-10-CM | POA: Diagnosis not present

## 2021-06-07 DIAGNOSIS — Z8249 Family history of ischemic heart disease and other diseases of the circulatory system: Secondary | ICD-10-CM | POA: Diagnosis not present

## 2021-06-07 LAB — BASIC METABOLIC PANEL
Anion gap: 8 (ref 5–15)
BUN: 15 mg/dL (ref 8–23)
CO2: 24 mmol/L (ref 22–32)
Calcium: 9 mg/dL (ref 8.9–10.3)
Chloride: 106 mmol/L (ref 98–111)
Creatinine, Ser: 0.86 mg/dL (ref 0.44–1.00)
GFR, Estimated: >60 mL/min (ref >60)
Glucose, Bld: 157 mg/dL — ABNORMAL HIGH (ref 70–99)
Potassium: 4 mmol/L (ref 3.5–5.1)
Sodium: 138 mmol/L (ref 135–145)

## 2021-06-07 MED ORDER — LEVOTHYROXINE SODIUM 100 MCG/5ML IV SOLN
37.5000 ug | INTRAVENOUS | Status: DC
  Administered 2021-06-07 – 2021-06-09 (×3): 37.5 ug via INTRAVENOUS
  Filled 2021-06-07 (×5): qty 5

## 2021-06-07 MED ORDER — LEVOTHYROXINE SODIUM 75 MCG PO TABS: 75.0000 ug | ORAL_TABLET | Freq: Every day | ORAL | Status: DC

## 2021-06-07 MED ORDER — PANTOPRAZOLE SODIUM 40 MG IV SOLR
40.0000 mg | Freq: Two times a day (BID) | INTRAVENOUS | Status: AC
  Administered 2021-06-07 – 2021-06-08 (×4): 40 mg via INTRAVENOUS
  Filled 2021-06-07 (×4): qty 40

## 2021-06-07 MED ORDER — MORPHINE SULFATE (PF) 2 MG/ML IV SOLN
2.0000 mg | Freq: Once | INTRAVENOUS | Status: DC
Start: 2021-06-07 — End: 2021-06-11
  Filled 2021-06-07 (×2): qty 1

## 2021-06-07 NOTE — Progress Notes (Signed)
PROGRESS NOTE    Annette Silva  ZHY:[id number] DOB: [birth year] DOA: 06/06/2021 PCP: Adaline Sill, NP  Outpatient Specialists:  Brief Narrative:  Patient is a 60 year old Caucasian female past medical history significant for hypothyroidism and right submandibular sialolithiasis.  Patient was admitted with right submandibular sialoadenitis, with associated increased pain and odynophagia.  According to the patient, the symptoms started around mid August of this year but the symptoms got worse today prior to admission.   CT scan of the soft tissue of the neck revealed: Right sialadenitis, with increased associated edema. Possible low-density collection with surrounding enhancement inferior to the dominant stone, concerning for phlegmon or developing abscess. Increased associated fat stranding and superficial edema. Edema from aforementioned sialadenitis extends into the hypopharynx and causes leftward deviation of the airway, without significant airway narrowing.  Patient has been on IV Decadron and Unasyn.  ENT is directing care.  06/07/2021: Patient seen alongside patient's wife and nurse.  Symptoms are improving, but patient still experiences pain.  No fever chills, no chest pain, shortness of breath, or other constitutional symptoms.  Patient he is now able to leak some ice blocks.  Patient reports nausea and headache.  We will start patient on IV Protonix, considering patient is n.p.o. and has been on IV high-dose steroids.  Assessment & Plan:   Active Problems:   Sialolithiasis of submandibular gland   Acute bacterial sialadenitis  Right submandibular sialadenitis with impacted 11 mm stone -ENT is directing care. -On IV Decadron 8 Mg every 8 hours and Unasyn 3 g every 6 hours. -The plan is for the patient to follow-up with her usual ENTon Monday (2 days time).   Odynophagia: -Improving. -Continue above management   Nausea:  -Patient has been on IV Decadron. -Start  IV Protonix. -Patient is also on opiates.  Hypothyroidism Start IV Synthroid 37.5 mcg daily (Half of the oral  home dose)   DVT prophylaxis: Subcutaneous Lovenox Code Status: Full code Family Communication: Husband was present during the consultation. Disposition Plan: Home   Consultants:  ENT  Procedures:  None  Antimicrobials:  IV Unasyn   Subjective: -Pain is improving. -Patient reports nausea. No fever or chills  Objective: Vitals:   06/06/21 2215 06/06/21 2312 06/07/21 0553 06/07/21 0833  BP: 103/67 111/86 114/77 130/86  Pulse: 65 79 81 86  Resp: 14 18 18 20   Temp:  98.1 F (36.7 C) 97.7 F (36.5 C) 97.9 F (36.6 C)  TempSrc:  Oral Oral Oral  SpO2: 93% 98% 95% 95%  Weight:  69.5 kg      Intake/Output Summary (Last 24 hours) at 06/07/2021 1221 Last data filed at 06/07/2021 0800 Gross per 24 hour  Intake 1280.49 ml  Output --  Net 1280.49 ml   Filed Weights   06/06/21 2312  Weight: 69.5 kg    Examination:  General exam: Awake and alert.  No significant painful distress.   Respiratory system: Clear to auscultation. Respiratory effort normal. Cardiovascular system: S1 & S2  Gastrointestinal system: Abdomen is nondistended, soft and nontender. No organomegaly or masses felt. Normal bowel sounds heard. Central nervous system: Alert and oriented. No focal neurological deficits. Extremities: No leg edema  Data Reviewed: I have personally reviewed following labs and imaging studies  CBC: Recent Labs  Lab 06/06/21 1053 06/06/21 1633  WBC 7.3 7.2  NEUTROABS 5.5  --   HGB 13.6 11.8*  HCT 40.8 35.4*  MCV 87.4 87.2  PLT 275 295   Basic Metabolic Panel: Recent Labs  Lab 06/06/21 1053 06/06/21 1633 06/07/21 0338  NA 139  --  138  K 4.2  --  4.0  CL 106  --  106  CO2 25  --  24  GLUCOSE 102*  --  157*  BUN 9  --  15  CREATININE 0.84 0.83 0.86  CALCIUM 9.4  --  9.0   GFR: CrCl cannot be calculated (Unknown ideal weight.). Liver Function  Tests: No results for input(s): AST, ALT, ALKPHOS, BILITOT, PROT, ALBUMIN in the last 168 hours. No results for input(s): LIPASE, AMYLASE in the last 168 hours. No results for input(s): AMMONIA in the last 168 hours. Coagulation Profile: No results for input(s): INR, PROTIME in the last 168 hours. Cardiac Enzymes: No results for input(s): CKTOTAL, CKMB, CKMBINDEX, TROPONINI in the last 168 hours. BNP (last 3 results) No results for input(s): PROBNP in the last 8760 hours. HbA1C: No results for input(s): HGBA1C in the last 72 hours. CBG: No results for input(s): GLUCAP in the last 168 hours. Lipid Profile: No results for input(s): CHOL, HDL, LDLCALC, TRIG, CHOLHDL, LDLDIRECT in the last 72 hours. Thyroid Function Tests: No results for input(s): TSH, T4TOTAL, FREET4, T3FREE, THYROIDAB in the last 72 hours. Anemia Panel: No results for input(s): VITAMINB12, FOLATE, FERRITIN, TIBC, IRON, RETICCTPCT in the last 72 hours. Urine analysis:    Component Value Date/Time   APPEARANCEUR Clear 10/14/2018 1202   GLUCOSEU Negative 10/14/2018 1202   BILIRUBINUR Negative 10/14/2018 1202   PROTEINUR Negative 10/14/2018 1202   NITRITE Negative 10/14/2018 1202   LEUKOCYTESUR Negative 10/14/2018 1202   Sepsis Labs: @LABRCNTIP (procalcitonin:4,lacticidven:4)  ) Recent Results (from the past 240 hour(s))  Resp Panel by RT-PCR (Flu A&B, Covid) Nasopharyngeal Swab     Status: None   Collection Time: 06/06/21  3:25 PM   Specimen: Nasopharyngeal Swab; Nasopharyngeal(NP) swabs in vial transport medium  Result Value Ref Range Status   SARS Coronavirus 2 by RT PCR NEGATIVE NEGATIVE Final    Comment: (NOTE) SARS-CoV-2 target nucleic acids are NOT DETECTED.  The SARS-CoV-2 RNA is generally detectable in upper respiratory specimens during the acute phase of infection. The lowest concentration of SARS-CoV-2 viral copies this assay can detect is 138 copies/mL. A negative result does not preclude  SARS-Cov-2 infection and should not be used as the sole basis for treatment or other patient management decisions. A negative result may occur with  improper specimen collection/handling, submission of specimen other than nasopharyngeal swab, presence of viral mutation(s) within the areas targeted by this assay, and inadequate number of viral copies(<138 copies/mL). A negative result must be combined with clinical observations, patient history, and epidemiological information. The expected result is Negative.  Fact Sheet for Patients:  EntrepreneurPulse.com.au  Fact Sheet for Healthcare Providers:  IncredibleEmployment.be  This test is no t yet approved or cleared by the [place] FDA and  has been authorized for detection and/or diagnosis of SARS-CoV-2 by FDA under an Emergency Use Authorization (EUA). This EUA will remain  in effect (meaning this test can be used) for the duration of the COVID-19 declaration under Section 564(b)(1) of the Act, 21 U.S.C.section 360bbb-3(b)(1), unless the authorization is terminated  or revoked sooner.       Influenza A by PCR NEGATIVE NEGATIVE Final   Influenza B by PCR NEGATIVE NEGATIVE Final    Comment: (NOTE) The Xpert Xpress SARS-CoV-2/FLU/RSV plus assay is intended as an aid in the diagnosis of influenza from Nasopharyngeal swab specimens and should not be used as a sole  basis for treatment. Nasal washings and aspirates are unacceptable for Xpert Xpress SARS-CoV-2/FLU/RSV testing.  Fact Sheet for Patients: EntrepreneurPulse.com.au  Fact Sheet for Healthcare Providers: IncredibleEmployment.be  This test is not yet approved or cleared by the [place] FDA and has been authorized for detection and/or diagnosis of SARS-CoV-2 by FDA under an Emergency Use Authorization (EUA). This EUA will remain in effect (meaning this test can be used) for the duration of  the COVID-19 declaration under Section 564(b)(1) of the Act, 21 U.S.C. section 360bbb-3(b)(1), unless the authorization is terminated or revoked.  Performed at [facility], [place] [address], [place], [place] [address]          Radiology Studies: CT Soft Tissue Neck W Contrast  Result Date: 06/06/2021 CLINICAL DATA:  Foreign body suspected, unable to swallow, silo adenitis of the right submandibular gland EXAM: CT NECK WITH CONTRAST TECHNIQUE: Multidetector CT imaging of the neck was performed using the standard protocol following the bolus administration of intravenous contrast. CONTRAST:  27mL OMNIPAQUE IOHEXOL 300 MG/ML  SOLN COMPARISON:  06/02/2021. FINDINGS: Pharynx and larynx: Mild mass effect from edema related to the known right submandibular sialadenitis, with leftward deviation of the hypopharynx and edema extending into the epiglottis, with effacement of the right vallecula and piriform recess. Salivary glands: The parotid glands and left submandibular gland are normal. 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 Unchanged appearance of the dominant stone within the submandibular gland, which measures 11 mm, with additional more distal 5 mm stone in the duct. Possible low-density collection with mild surrounding enhancement inferior to the dominant stone (series 100, image 44), which was not present on the prior exam, measuring approximately 1.6 x 2.3 x 1.8 cm. Increased edema and fat stranding about the right submandibular gland, particularly along the inferior aspect Thyroid: Normal. Lymph nodes: Prominent right level 1A, 1B, and 2A lymph nodes, likely reactive. Vascular: Negative. Limited intracranial: Negative. Visualized orbits: Not included in the field of view. Mastoids and visualized paranasal sinuses: Clear. Skeleton: No acute osseous abnormality. Upper chest:  Negative. Other: None. IMPRESSION: 1. Redemonstrated right sialadenitis, with increased associated edema. Possible low-density collection with surrounding enhancement inferior to the dominant stone, concerning for phlegmon or developing abscess. Increased associated fat stranding and superficial edema. 2. Edema from aforementioned sialadenitis extends into the hypopharynx and causes leftward deviation of the airway, without significant airway narrowing. These results were called by telephone at the time of interpretation on 06/06/2021 at 1:50 pm to provider ABIGAIL HARRIS , who verbally acknowledged these results. Electronically Signed   By: Merilyn Baba M.D.   On: 06/06/2021 13:52        Scheduled Meds:  dexamethasone (DECADRON) injection  8 mg Intravenous Q8H   enoxaparin (LOVENOX) injection  40 mg Subcutaneous Q24H   [START ON 06/08/2021] levothyroxine  75 mcg Oral Q0600   pantoprazole (PROTONIX) IV  40 mg Intravenous Q12H   Continuous Infusions:  ampicillin-sulbactam (UNASYN) IV 3 g (06/07/21 0931)   lactated ringers 125 mL/hr at 06/07/21 1052     LOS: 0 days    Time spent: 35 minutes    Dana Allan, MD  [facility] Pager #: [phone] 7PM-7AM contact night coverage as above

## 2021-06-08 DIAGNOSIS — K1121 Acute sialoadenitis: Secondary | ICD-10-CM | POA: Diagnosis not present

## 2021-06-08 DIAGNOSIS — K115 Sialolithiasis: Secondary | ICD-10-CM | POA: Diagnosis not present

## 2021-06-08 DIAGNOSIS — B9689 Other specified bacterial agents as the cause of diseases classified elsewhere: Secondary | ICD-10-CM | POA: Diagnosis not present

## 2021-06-08 MED ORDER — DEXTROSE-NACL 5-0.9 % IV SOLN: INTRAVENOUS | Status: DC

## 2021-06-08 NOTE — Plan of Care (Signed)
  Problem: Clinical Measurements: Goal: Diagnostic test results will improve Outcome: Progressing   

## 2021-06-08 NOTE — Progress Notes (Signed)
ENT PROGRESS NOTE  Patient seen and examined at bedside. Resting comfortably. States pain is improved. Not taking anything by mouth at this time.  Exam demonstrates induration of right submandibular gland with no erythema of overlying skin, no surrounding edema, no palpable fluctuance. Floor of mouth is soft, with no purulence from Wharton's duct, no palpable stone. No trismus.   Assessment/Plan: Annette Silva is a 60 y/o female acute right submandibular  sialoadenitis in the setting of sialolithiasis, improved on exam today following treatment with IV antibiotics and steroids.  -Stable for discharge from ENT perspective. Recommend discharge with Clindamycin 450mg  TID x 10 days, Medrol dosepak, Ibuprofen 800mg  TID. Agree with PPI.  -Recommend continued application of warm compresses and gentle massage to affected area.  -Recommend aggressive oral hydration, with frequent use of sialogogues -Outpatient follow up with Dr. Constance Holster as scheduled on tomorrow 10/10 to discuss definitive surgical management.    Thank you for allowing me to participate in the care of this patient. Please do not hesitate to contact me with any questions or concerns.    Jason Coop, [facility] Cell: [phone]

## 2021-06-08 NOTE — Progress Notes (Signed)
PROGRESS NOTE    Annette Silva  ZJI:[id number] DOB: 1961/03/22 DOA: 06/06/2021 PCP: Adaline Sill, NP  Outpatient Specialists:  Brief Narrative:  Patient is a 60 year old Caucasian female past medical history significant for hypothyroidism and right submandibular sialolithiasis.  Patient was admitted with right submandibular sialoadenitis, with associated increased pain and odynophagia.  According to the patient, the symptoms started around mid August of this year but the symptoms got worse today prior to admission.   CT scan of the soft tissue of the neck revealed: Right sialadenitis, with increased associated edema. Possible low-density collection with surrounding enhancement inferior to the dominant stone, concerning for phlegmon or developing abscess. Increased associated fat stranding and superficial edema. Edema from aforementioned sialadenitis extends into the hypopharynx and causes leftward deviation of the airway, without significant airway narrowing.  Patient has been on IV Decadron and Unasyn.  ENT is directing care.  06/07/2021: Patient seen alongside patient's wife and nurse.  Symptoms are improving, but patient still experiences pain.  No fever chills, no chest pain, shortness of breath, or other constitutional symptoms.  Patient he is now able to leak some ice blocks.  Patient reports nausea and headache.  We will start patient on IV Protonix, considering patient is n.p.o. and has been on IV high-dose steroids.  06/08/2021: Patient seen alongside patient's husband.  No new complaints.  Remains on IV steroids and antibiotics.  Further management as per ENT team  Assessment & Plan:   Active Problems:   Sialolithiasis of submandibular gland   Acute bacterial sialadenitis   Sialoadenitis  Right submandibular sialadenitis with impacted 11 mm stone -ENT is directing care. -On IV Decadron 8 Mg every 8 hours and Unasyn 3 g every 6 hours. -The plan is for the patient  to follow-up with her usual ENT tomorrow, Monday, 06/09/2021.    Odynophagia: -Improving. -Continue above management   Nausea:  -Patient has been on IV Decadron. -Continue IV Protonix. -Nausea is a lot better today.  Hypothyroidism Start IV Synthroid 37.5 mcg daily (Half of the oral  home dose)   DVT prophylaxis: Subcutaneous Lovenox Code Status: Full code Family Communication: Husband was present during the consultation. Disposition Plan: Home   Consultants:  ENT  Procedures:  None  Antimicrobials:  IV Unasyn   Subjective: -Pain is improving. -Patient reports nausea. No fever or chills  Objective: Vitals:   06/07/21 2129 06/08/21 0600 06/08/21 0931 06/08/21 1638  BP: 105/71 127/80 135/79 132/75  Pulse: 75 65 62 66  Resp: 18 18 18 18   Temp: 98.2 F (36.8 C) 97.7 F (36.5 C) 97.7 F (36.5 C) 98.2 F (36.8 C)  TempSrc: Oral Oral Oral Oral  SpO2: 97% 97% 96% 97%  Weight: 69.5 kg       Intake/Output Summary (Last 24 hours) at 06/08/2021 1832 Last data filed at 06/08/2021 1300 Gross per 24 hour  Intake 3218.39 ml  Output 0 ml  Net 3218.39 ml    Filed Weights   06/06/21 2312 06/07/21 2129  Weight: 69.5 kg 69.5 kg    Examination:  General exam: Awake and alert.  No significant painful distress.   Respiratory system: Clear to auscultation. Respiratory effort normal. Cardiovascular system: S1 & S2  Gastrointestinal system: Abdomen is nondistended, soft and nontender. No organomegaly or masses felt. Normal bowel sounds heard. Central nervous system: Alert and oriented. No focal neurological deficits. Extremities: No leg edema  Data Reviewed: I have personally reviewed following labs and imaging studies  CBC: Recent Labs  Lab 06/06/21 1053 06/06/21 1633  WBC 7.3 7.2  NEUTROABS 5.5  --   HGB 13.6 11.8*  HCT 40.8 35.4*  MCV 87.4 87.2  PLT 275 782    Basic Metabolic Panel: Recent Labs  Lab 06/06/21 1053 06/06/21 1633 06/07/21 0338  NA 139   --  138  K 4.2  --  4.0  CL 106  --  106  CO2 25  --  24  GLUCOSE 102*  --  157*  BUN 9  --  15  CREATININE 0.84 0.83 0.86  CALCIUM 9.4  --  9.0    GFR: CrCl cannot be calculated (Unknown ideal weight.). Liver Function Tests: No results for input(s): AST, ALT, ALKPHOS, BILITOT, PROT, ALBUMIN in the last 168 hours. No results for input(s): LIPASE, AMYLASE in the last 168 hours. No results for input(s): AMMONIA in the last 168 hours. Coagulation Profile: No results for input(s): INR, PROTIME in the last 168 hours. Cardiac Enzymes: No results for input(s): CKTOTAL, CKMB, CKMBINDEX, TROPONINI in the last 168 hours. BNP (last 3 results) No results for input(s): PROBNP in the last 8760 hours. HbA1C: No results for input(s): HGBA1C in the last 72 hours. CBG: No results for input(s): GLUCAP in the last 168 hours. Lipid Profile: No results for input(s): CHOL, HDL, LDLCALC, TRIG, CHOLHDL, LDLDIRECT in the last 72 hours. Thyroid Function Tests: No results for input(s): TSH, T4TOTAL, FREET4, T3FREE, THYROIDAB in the last 72 hours. Anemia Panel: No results for input(s): VITAMINB12, FOLATE, FERRITIN, TIBC, IRON, RETICCTPCT in the last 72 hours. Urine analysis:    Component Value Date/Time   APPEARANCEUR Clear 10/14/2018 1202   GLUCOSEU Negative 10/14/2018 1202   BILIRUBINUR Negative 10/14/2018 1202   PROTEINUR Negative 10/14/2018 1202   NITRITE Negative 10/14/2018 1202   LEUKOCYTESUR Negative 10/14/2018 1202   Sepsis Labs: @LABRCNTIP (procalcitonin:4,lacticidven:4)  ) Recent Results (from the past 240 hour(s))  Resp Panel by RT-PCR (Flu A&B, Covid) Nasopharyngeal Swab     Status: None   Collection Time: 06/06/21  3:25 PM   Specimen: Nasopharyngeal Swab; Nasopharyngeal(NP) swabs in vial transport medium  Result Value Ref Range Status   SARS Coronavirus 2 by RT PCR NEGATIVE NEGATIVE Final    Comment: (NOTE) SARS-CoV-2 target nucleic acids are NOT DETECTED.  The SARS-CoV-2 RNA is  generally detectable in upper respiratory specimens during the acute phase of infection. The lowest concentration of SARS-CoV-2 viral copies this assay can detect is 138 copies/mL. A negative result does not preclude SARS-Cov-2 infection and should not be used as the sole basis for treatment or other patient management decisions. A negative result may occur with  improper specimen collection/handling, submission of specimen other than nasopharyngeal swab, presence of viral mutation(s) within the areas targeted by this assay, and inadequate number of viral copies(<138 copies/mL). A negative result must be combined with clinical observations, patient history, and epidemiological information. The expected result is Negative.  Fact Sheet for Patients:  EntrepreneurPulse.com.au  Fact Sheet for Healthcare Providers:  IncredibleEmployment.be  This test is no t yet approved or cleared by the [place] FDA and  has been authorized for detection and/or diagnosis of SARS-CoV-2 by FDA under an Emergency Use Authorization (EUA). This EUA will remain  in effect (meaning this test can be used) for the duration of the COVID-19 declaration under Section 564(b)(1) of the Act, 21 U.S.C.section 360bbb-3(b)(1), unless the authorization is terminated  or revoked sooner.       Influenza A by PCR NEGATIVE NEGATIVE Final  Influenza B by PCR NEGATIVE NEGATIVE Final    Comment: (NOTE) The Xpert Xpress SARS-CoV-2/FLU/RSV plus assay is intended as an aid in the diagnosis of influenza from Nasopharyngeal swab specimens and should not be used as a sole basis for treatment. Nasal washings and aspirates are unacceptable for Xpert Xpress SARS-CoV-2/FLU/RSV testing.  Fact Sheet for Patients: EntrepreneurPulse.com.au  Fact Sheet for Healthcare Providers: IncredibleEmployment.be  This test is not yet approved or cleared by the [place] FDA and has been authorized for detection and/or diagnosis of SARS-CoV-2 by FDA under an Emergency Use Authorization (EUA). This EUA will remain in effect (meaning this test can be used) for the duration of the COVID-19 declaration under Section 564(b)(1) of the Act, 21 U.S.C. section 360bbb-3(b)(1), unless the authorization is terminated or revoked.  Performed at [facility], [place] [address] [place], [place] [address]          Radiology Studies: No results found.      Scheduled Meds:  dexamethasone (DECADRON) injection  8 mg Intravenous Q8H   enoxaparin (LOVENOX) injection  40 mg Subcutaneous Q24H   levothyroxine  37.5 mcg Intravenous Q24H    morphine injection  2 mg Intravenous Once   pantoprazole (PROTONIX) IV  40 mg Intravenous Q12H   Continuous Infusions:  ampicillin-sulbactam (UNASYN) IV 3 g (06/08/21 1602)   dextrose 5 % and 0.9% NaCl 75 mL/hr at 06/08/21 1434     LOS: 1 day    Time spent: 25 minutes    Dana Allan, MD  [facility] Pager #: [phone] 7PM-7AM contact night coverage as above

## 2021-06-09 DIAGNOSIS — K1120 Sialoadenitis, unspecified: Secondary | ICD-10-CM

## 2021-06-09 DIAGNOSIS — K115 Sialolithiasis: Secondary | ICD-10-CM | POA: Diagnosis not present

## 2021-06-09 LAB — RENAL FUNCTION PANEL
Albumin: 2.9 g/dL — ABNORMAL LOW (ref 3.5–5.0)
Anion gap: 6 (ref 5–15)
BUN: 17 mg/dL (ref 8–23)
CO2: 27 mmol/L (ref 22–32)
Calcium: 8.6 mg/dL — ABNORMAL LOW (ref 8.9–10.3)
Chloride: 108 mmol/L (ref 98–111)
Creatinine, Ser: 0.91 mg/dL (ref 0.44–1.00)
GFR, Estimated: >60 mL/min (ref >60)
Glucose, Bld: 145 mg/dL — ABNORMAL HIGH (ref 70–99)
Phosphorus: 2.7 mg/dL (ref 2.5–4.6)
Potassium: 3.6 mmol/L (ref 3.5–5.1)
Sodium: 141 mmol/L (ref 135–145)

## 2021-06-09 LAB — SURGICAL PCR SCREEN
MRSA, PCR: NEGATIVE
Staphylococcus aureus: NEGATIVE

## 2021-06-09 LAB — CBC WITH DIFFERENTIAL/PLATELET
Abs Immature Granulocytes: 0.09 10*3/uL — ABNORMAL HIGH (ref 0.00–0.07)
Basophils Absolute: 0 10*3/uL (ref 0.0–0.1)
Basophils Relative: 0 %
Eosinophils Absolute: 0 10*3/uL (ref 0.0–0.5)
Eosinophils Relative: 0 %
HCT: 34.1 % — ABNORMAL LOW (ref 36.0–46.0)
Hemoglobin: 11.5 g/dL — ABNORMAL LOW (ref 12.0–15.0)
Immature Granulocytes: 1 %
Lymphocytes Relative: 15 %
Lymphs Abs: 1.1 10*3/uL (ref 0.7–4.0)
MCH: 29 pg (ref 26.0–34.0)
MCHC: 33.7 g/dL (ref 30.0–36.0)
MCV: 85.9 fL (ref 80.0–100.0)
Monocytes Absolute: 0.3 10*3/uL (ref 0.1–1.0)
Monocytes Relative: 3 %
Neutro Abs: 5.9 10*3/uL (ref 1.7–7.7)
Neutrophils Relative %: 81 %
Platelets: 280 10*3/uL (ref 150–400)
RBC: 3.97 MIL/uL (ref 3.87–5.11)
RDW: 12.6 % (ref 11.5–15.5)
WBC: 7.3 10*3/uL (ref 4.0–10.5)
nRBC: 0 % (ref 0.0–0.2)

## 2021-06-09 LAB — MAGNESIUM: Magnesium: 2 mg/dL (ref 1.7–2.4)

## 2021-06-09 MED ORDER — FAMOTIDINE IN NACL 20-0.9 MG/50ML-% IV SOLN
20.0000 mg | Freq: Once | INTRAVENOUS | Status: AC
  Administered 2021-06-09: 20 mg via INTRAVENOUS
  Filled 2021-06-09: qty 50

## 2021-06-09 NOTE — Progress Notes (Signed)
PROGRESS NOTE    Annette Silva  QZR:[id number] DOB: Jun 04, 1961 DOA: 06/06/2021 PCP: Adaline Sill, NP    Brief Narrative:  60 year old Caucasian female past medical history significant for hypothyroidism and right submandibular sialolithiasis.  Patient was admitted with right submandibular sialoadenitis, with associated increased pain and odynophagia.  According to the patient, the symptoms started around mid August of this year but the symptoms got worse prior to admit. Pt was noted to have R sialadenitis with associated edema. Pt had been continued on steroids with ENT following.   Assessment & Plan:   Active Problems:   Sialolithiasis of submandibular gland   Acute bacterial sialadenitis   Sialoadenitis  Right submandibular sialadenitis with impacted 11 mm stone -ENT had been following -Pt is continued on IV Decadron 8 Mg every 8 hours and Unasyn 3 g every 6 hours. -The tentative plan was for follow-up with her ENT 06/09/2021, however appointment was cancelled for whatever reason -Discussed with ENT who will plan removal of glan in the next day or so  Odynophagia: -Improving. -tolerating clears this AM   Nausea:  -Patient has been on IV Decadron. -Continue IV Protonix. -Improved.   Hypothyroidism Currently on IV Synthroid 37.5 mcg daily (Half of the oral  home dose)   DVT prophylaxis: Lovenox subQ Code Status: Full Family Communication: Pt in room, family not at bedside  Status is: Inpatient  Remains inpatient appropriate because:Inpatient level of care appropriate due to severity of illness  Dispo: The patient is from: Home              Anticipated d/c is to: Home              Patient currently is not medically stable to d/c.   Difficult to place patient No       Consultants:  ENT  Procedures:    Antimicrobials: Anti-infectives (From admission, onward)    Start     Dose/Rate Route Frequency Ordered Stop   06/06/21 1445  Ampicillin-Sulbactam  (UNASYN) 3 g in sodium chloride 0.9 % 100 mL IVPB        3 g 200 mL/hr over 30 Minutes Intravenous Every 6 hours 06/06/21 1435     06/06/21 1430  clindamycin (CLEOCIN) IVPB 600 mg  Status:  Discontinued        600 mg 100 mL/hr over 30 Minutes Intravenous  Once 06/06/21 1422 06/06/21 1434       Subjective: Still having some discomfort, but tolerated clears today  Objective: Vitals:   06/08/21 2103 06/09/21 0457 06/09/21 0457 06/09/21 1005  BP: 128/71 123/87 123/87 130/80  Pulse: (!) 53 (!) 55 (!) 55 62  Resp: 18 18 18 18   Temp: (!) 97.5 F (36.4 C) (!) 97.3 F (36.3 C) (!) 97.3 F (36.3 C) 97.9 F (36.6 C)  TempSrc: Oral Oral Oral Oral  SpO2: 97% 95% 95% 100%  Weight:        Intake/Output Summary (Last 24 hours) at 06/09/2021 1424 Last data filed at 06/09/2021 1300 Gross per 24 hour  Intake 2148.78 ml  Output 0 ml  Net 2148.78 ml   Filed Weights   06/06/21 2312 06/07/21 2129  Weight: 69.5 kg 69.5 kg    Examination: General exam: Awake, laying in bed, in nad Respiratory system: Normal respiratory effort, no wheezing Cardiovascular system: regular rate, s1, s2 Gastrointestinal system: Soft, nondistended, positive BS Central nervous system: CN2-12 grossly intact, strength intact Extremities: Perfused, no clubbing Skin: Normal skin turgor, no notable skin  lesions seen Psychiatry: Mood normal // no visual hallucinations   Data Reviewed: I have personally reviewed following labs and imaging studies  CBC: Recent Labs  Lab 06/06/21 1053 06/06/21 1633 06/09/21 0401  WBC 7.3 7.2 7.3  NEUTROABS 5.5  --  5.9  HGB 13.6 11.8* 11.5*  HCT 40.8 35.4* 34.1*  MCV 87.4 87.2 85.9  PLT 275 258 638   Basic Metabolic Panel: Recent Labs  Lab 06/06/21 1053 06/06/21 1633 06/07/21 0338 06/09/21 0401  NA 139  --  138 141  K 4.2  --  4.0 3.6  CL 106  --  106 108  CO2 25  --  24 27  GLUCOSE 102*  --  157* 145*  BUN 9  --  15 17  CREATININE 0.84 0.83 0.86 0.91  CALCIUM  9.4  --  9.0 8.6*  MG  --   --   --  2.0  PHOS  --   --   --  2.7   GFR: CrCl cannot be calculated (Unknown ideal weight.). Liver Function Tests: Recent Labs  Lab 06/09/21 0401  ALBUMIN 2.9*   No results for input(s): LIPASE, AMYLASE in the last 168 hours. No results for input(s): AMMONIA in the last 168 hours. Coagulation Profile: No results for input(s): INR, PROTIME in the last 168 hours. Cardiac Enzymes: No results for input(s): CKTOTAL, CKMB, CKMBINDEX, TROPONINI in the last 168 hours. BNP (last 3 results) No results for input(s): PROBNP in the last 8760 hours. HbA1C: No results for input(s): HGBA1C in the last 72 hours. CBG: No results for input(s): GLUCAP in the last 168 hours. Lipid Profile: No results for input(s): CHOL, HDL, LDLCALC, TRIG, CHOLHDL, LDLDIRECT in the last 72 hours. Thyroid Function Tests: No results for input(s): TSH, T4TOTAL, FREET4, T3FREE, THYROIDAB in the last 72 hours. Anemia Panel: No results for input(s): VITAMINB12, FOLATE, FERRITIN, TIBC, IRON, RETICCTPCT in the last 72 hours. Sepsis Labs: No results for input(s): PROCALCITON, LATICACIDVEN in the last 168 hours.  Recent Results (from the past 240 hour(s))  Resp Panel by RT-PCR (Flu A&B, Covid) Nasopharyngeal Swab     Status: None   Collection Time: 06/06/21  3:25 PM   Specimen: Nasopharyngeal Swab; Nasopharyngeal(NP) swabs in vial transport medium  Result Value Ref Range Status   SARS Coronavirus 2 by RT PCR NEGATIVE NEGATIVE Final    Comment: (NOTE) SARS-CoV-2 target nucleic acids are NOT DETECTED.  The SARS-CoV-2 RNA is generally detectable in upper respiratory specimens during the acute phase of infection. The lowest concentration of SARS-CoV-2 viral copies this assay can detect is 138 copies/mL. A negative result does not preclude SARS-Cov-2 infection and should not be used as the sole basis for treatment or other patient management decisions. A negative result may occur with   improper specimen collection/handling, submission of specimen other than nasopharyngeal swab, presence of viral mutation(s) within the areas targeted by this assay, and inadequate number of viral copies(<138 copies/mL). A negative result must be combined with clinical observations, patient history, and epidemiological information. The expected result is Negative.  Fact Sheet for Patients:  EntrepreneurPulse.com.au  Fact Sheet for Healthcare Providers:  IncredibleEmployment.be  This test is no t yet approved or cleared by the [place] FDA and  has been authorized for detection and/or diagnosis of SARS-CoV-2 by FDA under an Emergency Use Authorization (EUA). This EUA will remain  in effect (meaning this test can be used) for the duration of the COVID-19 declaration under Section 564(b)(1) of the Act,  21 U.S.C.section 360bbb-3(b)(1), unless the authorization is terminated  or revoked sooner.       Influenza A by PCR NEGATIVE NEGATIVE Final   Influenza B by PCR NEGATIVE NEGATIVE Final    Comment: (NOTE) The Xpert Xpress SARS-CoV-2/FLU/RSV plus assay is intended as an aid in the diagnosis of influenza from Nasopharyngeal swab specimens and should not be used as a sole basis for treatment. Nasal washings and aspirates are unacceptable for Xpert Xpress SARS-CoV-2/FLU/RSV testing.  Fact Sheet for Patients: EntrepreneurPulse.com.au  Fact Sheet for Healthcare Providers: IncredibleEmployment.be  This test is not yet approved or cleared by the [place] FDA and has been authorized for detection and/or diagnosis of SARS-CoV-2 by FDA under an Emergency Use Authorization (EUA). This EUA will remain in effect (meaning this test can be used) for the duration of the COVID-19 declaration under Section 564(b)(1) of the Act, 21 U.S.C. section 360bbb-3(b)(1), unless the authorization is terminated  or revoked.  Performed at [facility], [place] [address], [place], [place] [address]      Radiology Studies: No results found.  Scheduled Meds:  dexamethasone (DECADRON) injection  8 mg Intravenous Q8H   enoxaparin (LOVENOX) injection  40 mg Subcutaneous Q24H   levothyroxine  37.5 mcg Intravenous Q24H    morphine injection  2 mg Intravenous Once   Continuous Infusions:  ampicillin-sulbactam (UNASYN) IV 3 g (06/09/21 1105)   dextrose 5 % and 0.9% NaCl 75 mL/hr at 06/09/21 0420     LOS: 2 days   Marylu Lund, MD [facility] Pager On Amion  If 7PM-7AM, please contact night-coverage 06/09/2021, 2:24 PM

## 2021-06-09 NOTE — Progress Notes (Signed)
Patient known to me from office visit a couple of years ago.  She had silo lithiasis then with her first episode having resolved and did not have another episode until a few months ago.  She has had chronic problems ever since.  She has been in the hospital now for several days had significant swelling of the right side of the face across the midline and all the way up to the ear on the right.  This is now mostly gotten better.  She has taken a small amount of liquids by mouth today for the first time since admission.  She is ready to have surgery and would like to have the gland removed.  We discussed that we might be able to remove the stones with a transoral approach but she has had so much pain in her mouth and is not interested in doing that.  She just wants to have the gland removed.  I have reviewed the CT scan.  There is a very large stone at the hilum of the gland and a smaller one in the distal duct.  I think removal of the gland is a reasonable option given these findings.  We will try to schedule in the next day or so.  Risks and benefits were discussed and all questions were answered.  She is agreeable.

## 2021-06-09 NOTE — H&P (View-Only) (Signed)
Patient known to me from office visit a couple of years ago.  She had silo lithiasis then with her first episode having resolved and did not have another episode until a few months ago.  She has had chronic problems ever since.  She has been in the hospital now for several days had significant swelling of the right side of the face across the midline and all the way up to the ear on the right.  This is now mostly gotten better.  She has taken a small amount of liquids by mouth today for the first time since admission.  She is ready to have surgery and would like to have the gland removed.  We discussed that we might be able to remove the stones with a transoral approach but she has had so much pain in her mouth and is not interested in doing that.  She just wants to have the gland removed.  I have reviewed the CT scan.  There is a very large stone at the hilum of the gland and a smaller one in the distal duct.  I think removal of the gland is a reasonable option given these findings.  We will try to schedule in the next day or so.  Risks and benefits were discussed and all questions were answered.  She is agreeable.

## 2021-06-10 ENCOUNTER — Encounter (HOSPITAL_COMMUNITY)
Admission: EM | Disposition: A | Discharge status: Home / Self Care | Payer: Self-pay | Source: Home / Self Care | Attending: Internal Medicine

## 2021-06-10 ENCOUNTER — Encounter (HOSPITAL_COMMUNITY): Payer: Self-pay | Admitting: Internal Medicine

## 2021-06-10 ENCOUNTER — Inpatient Hospital Stay (HOSPITAL_COMMUNITY): Admitting: Certified Registered Nurse Anesthetist

## 2021-06-10 DIAGNOSIS — K115 Sialolithiasis: Secondary | ICD-10-CM | POA: Diagnosis not present

## 2021-06-10 DIAGNOSIS — K1120 Sialoadenitis, unspecified: Secondary | ICD-10-CM | POA: Diagnosis not present

## 2021-06-10 HISTORY — PX: SUBMANDIBULAR GLAND EXCISION: SHX2456

## 2021-06-10 SURGERY — EXCISION, SUBMANDIBULAR GLAND
Anesthesia: General | Site: Neck | Laterality: Right

## 2021-06-10 MED ORDER — CHLORHEXIDINE GLUCONATE 0.12 % MT SOLN: 15.0000 mL | Freq: Once | OROMUCOSAL | Status: AC

## 2021-06-10 MED ORDER — FENTANYL CITRATE (PF) 250 MCG/5ML IJ SOLN
INTRAMUSCULAR | Status: AC
  Filled 2021-06-10: qty 5

## 2021-06-10 MED ORDER — ONDANSETRON HCL 4 MG/2ML IJ SOLN
INTRAMUSCULAR | Status: DC | PRN
  Administered 2021-06-10: 4 mg via INTRAVENOUS

## 2021-06-10 MED ORDER — PROPOFOL 10 MG/ML IV BOLUS
INTRAVENOUS | Status: DC | PRN
  Administered 2021-06-10: 100 mg via INTRAVENOUS

## 2021-06-10 MED ORDER — ONDANSETRON HCL 4 MG/2ML IJ SOLN
INTRAMUSCULAR | Status: AC
  Filled 2021-06-10: qty 2

## 2021-06-10 MED ORDER — SUGAMMADEX SODIUM 200 MG/2ML IV SOLN
INTRAVENOUS | Status: DC | PRN
  Administered 2021-06-10: 200 mg via INTRAVENOUS

## 2021-06-10 MED ORDER — GLYCOPYRROLATE PF 0.2 MG/ML IJ SOSY
PREFILLED_SYRINGE | INTRAMUSCULAR | Status: AC
  Filled 2021-06-10: qty 1

## 2021-06-10 MED ORDER — DEXMEDETOMIDINE (PRECEDEX) IN NS 20 MCG/5ML (4 MCG/ML) IV SYRINGE
PREFILLED_SYRINGE | INTRAVENOUS | Status: DC | PRN
  Administered 2021-06-10 (×2): 8 ug via INTRAVENOUS

## 2021-06-10 MED ORDER — LIDOCAINE-EPINEPHRINE 1 %-1:100000 IJ SOLN
INTRAMUSCULAR | Status: AC
  Filled 2021-06-10: qty 1

## 2021-06-10 MED ORDER — LIDOCAINE-EPINEPHRINE 1 %-1:100000 IJ SOLN
INTRAMUSCULAR | Status: DC | PRN
  Administered 2021-06-10: 20 mL

## 2021-06-10 MED ORDER — LIDOCAINE 2% (20 MG/ML) 5 ML SYRINGE
INTRAMUSCULAR | Status: DC | PRN
  Administered 2021-06-10: 40 mg via INTRAVENOUS

## 2021-06-10 MED ORDER — FENTANYL CITRATE (PF) 250 MCG/5ML IJ SOLN
INTRAMUSCULAR | Status: DC | PRN
  Administered 2021-06-10 (×2): 50 ug via INTRAVENOUS
  Administered 2021-06-10: 100 ug via INTRAVENOUS
  Administered 2021-06-10: 50 ug via INTRAVENOUS

## 2021-06-10 MED ORDER — MIDAZOLAM HCL 2 MG/2ML IJ SOLN
INTRAMUSCULAR | Status: AC
  Filled 2021-06-10: qty 2

## 2021-06-10 MED ORDER — LIDOCAINE 2% (20 MG/ML) 5 ML SYRINGE
INTRAMUSCULAR | Status: AC
  Filled 2021-06-10: qty 5

## 2021-06-10 MED ORDER — PHENYLEPHRINE 40 MCG/ML (10ML) SYRINGE FOR IV PUSH (FOR BLOOD PRESSURE SUPPORT)
PREFILLED_SYRINGE | INTRAVENOUS | Status: DC | PRN
  Administered 2021-06-10: 80 ug via INTRAVENOUS

## 2021-06-10 MED ORDER — GLYCOPYRROLATE PF 0.2 MG/ML IJ SOSY
PREFILLED_SYRINGE | INTRAMUSCULAR | Status: DC | PRN
  Administered 2021-06-10: .2 mg via INTRAVENOUS

## 2021-06-10 MED ORDER — FENTANYL CITRATE (PF) 100 MCG/2ML IJ SOLN
25.0000 ug | INTRAMUSCULAR | Status: DC | PRN
  Administered 2021-06-10 (×2): 50 ug via INTRAVENOUS

## 2021-06-10 MED ORDER — CHLORHEXIDINE GLUCONATE 0.12 % MT SOLN
OROMUCOSAL | Status: AC
  Administered 2021-06-10: 15 mL via OROMUCOSAL
  Filled 2021-06-10: qty 15

## 2021-06-10 MED ORDER — DEXAMETHASONE SODIUM PHOSPHATE 10 MG/ML IJ SOLN
INTRAMUSCULAR | Status: AC
  Filled 2021-06-10: qty 1

## 2021-06-10 MED ORDER — LACTATED RINGERS IV SOLN: INTRAVENOUS | Status: DC

## 2021-06-10 MED ORDER — ROCURONIUM BROMIDE 10 MG/ML (PF) SYRINGE
PREFILLED_SYRINGE | INTRAVENOUS | Status: AC
  Filled 2021-06-10: qty 10

## 2021-06-10 MED ORDER — DEXMEDETOMIDINE (PRECEDEX) IN NS 20 MCG/5ML (4 MCG/ML) IV SYRINGE
PREFILLED_SYRINGE | INTRAVENOUS | Status: AC
  Filled 2021-06-10: qty 5

## 2021-06-10 MED ORDER — BACITRACIN ZINC 500 UNIT/GM EX OINT
TOPICAL_OINTMENT | CUTANEOUS | Status: AC
  Filled 2021-06-10: qty 28.35

## 2021-06-10 MED ORDER — ORAL CARE MOUTH RINSE: 15.0000 mL | Freq: Once | OROMUCOSAL | Status: AC

## 2021-06-10 MED ORDER — 0.9 % SODIUM CHLORIDE (POUR BTL) OPTIME
TOPICAL | Status: DC | PRN
  Administered 2021-06-10: 1000 mL

## 2021-06-10 MED ORDER — PHENYLEPHRINE 40 MCG/ML (10ML) SYRINGE FOR IV PUSH (FOR BLOOD PRESSURE SUPPORT)
PREFILLED_SYRINGE | INTRAVENOUS | Status: AC
  Filled 2021-06-10: qty 10

## 2021-06-10 MED ORDER — ROCURONIUM BROMIDE 10 MG/ML (PF) SYRINGE
PREFILLED_SYRINGE | INTRAVENOUS | Status: DC | PRN
  Administered 2021-06-10: 70 mg via INTRAVENOUS

## 2021-06-10 MED ORDER — FENTANYL CITRATE (PF) 100 MCG/2ML IJ SOLN
INTRAMUSCULAR | Status: AC
  Filled 2021-06-10: qty 2

## 2021-06-10 SURGICAL SUPPLY — 53 items
ATTRACTOMAT 16X20 MAGNETIC DRP (DRAPES) IMPLANT
BAG COUNTER SPONGE SURGICOUNT (BAG) ×2 IMPLANT
BLADE SURG 15 STRL LF DISP TIS (BLADE) IMPLANT
BLADE SURG 15 STRL SS (BLADE)
CANISTER SUCT 3000ML PPV (MISCELLANEOUS) ×2 IMPLANT
CLEANER TIP ELECTROSURG 2X2 (MISCELLANEOUS) ×2 IMPLANT
CNTNR URN SCR LID CUP LEK RST (MISCELLANEOUS) ×1 IMPLANT
CONT SPEC 4OZ STRL OR WHT (MISCELLANEOUS) ×1
CORD BIPOLAR FORCEPS 12FT (ELECTRODE) ×2 IMPLANT
COVER SURGICAL LIGHT HANDLE (MISCELLANEOUS) ×2 IMPLANT
DERMABOND ADVANCED (GAUZE/BANDAGES/DRESSINGS) ×1
DERMABOND ADVANCED .7 DNX12 (GAUZE/BANDAGES/DRESSINGS) ×1 IMPLANT
DRAIN JACKSON RD 7FR 3/32 (WOUND CARE) ×1 IMPLANT
DRAIN PENROSE 1/4X12 LTX STRL (WOUND CARE) ×2 IMPLANT
DRAIN SNY 10 ROU (WOUND CARE) IMPLANT
DRAPE HALF SHEET 40X57 (DRAPES) IMPLANT
DRAPE SURG 17X23 STRL (DRAPES) ×2 IMPLANT
ELECT COATED BLADE 2.86 ST (ELECTRODE) ×2 IMPLANT
ELECT REM PT RETURN 9FT ADLT (ELECTROSURGICAL) ×2
ELECTRODE REM PT RTRN 9FT ADLT (ELECTROSURGICAL) ×1 IMPLANT
EVACUATOR SILICONE 100CC (DRAIN) ×1 IMPLANT
FORCEPS BIPOLAR SPETZLER 8 1.0 (NEUROSURGERY SUPPLIES) ×2 IMPLANT
GAUZE 4X4 16PLY ~~LOC~~+RFID DBL (SPONGE) ×2 IMPLANT
GLOVE SURG LTX SZ7.5 (GLOVE) ×2 IMPLANT
GLOVE SURG UNDER POLY LF SZ6.5 (GLOVE) ×1 IMPLANT
GLOVE SURG UNDER POLY LF SZ7 (GLOVE) ×1 IMPLANT
GOWN STRL REUS W/ TWL LRG LVL3 (GOWN DISPOSABLE) ×2 IMPLANT
GOWN STRL REUS W/TWL LRG LVL3 (GOWN DISPOSABLE) ×2
KIT BASIN OR (CUSTOM PROCEDURE TRAY) ×2 IMPLANT
KIT TURNOVER KIT B (KITS) ×2 IMPLANT
LOCATOR NERVE 3 VOLT (DISPOSABLE) IMPLANT
NDL PRECISIONGLIDE 27X1.5 (NEEDLE) ×1 IMPLANT
NEEDLE PRECISIONGLIDE 27X1.5 (NEEDLE) ×2 IMPLANT
NS IRRIG 1000ML POUR BTL (IV SOLUTION) ×2 IMPLANT
PAD ARMBOARD 7.5X6 YLW CONV (MISCELLANEOUS) ×4 IMPLANT
PENCIL FOOT CONTROL (ELECTRODE) ×2 IMPLANT
SHEARS HARMONIC 9CM CVD (BLADE) IMPLANT
SPECIMEN JAR SMALL (MISCELLANEOUS) ×2 IMPLANT
STAPLER VISISTAT 35W (STAPLE) ×2 IMPLANT
SUT CHROMIC 3 0 PS 2 (SUTURE) IMPLANT
SUT CHROMIC 4 0 PS 2 18 (SUTURE) ×2 IMPLANT
SUT ETHILON 2 0 FS 18 (SUTURE) IMPLANT
SUT ETHILON 3 0 PS 1 (SUTURE) ×1 IMPLANT
SUT ETHILON 5 0 P 3 18 (SUTURE)
SUT NYLON ETHILON 5-0 P-3 1X18 (SUTURE) IMPLANT
SUT SILK 2 0 REEL (SUTURE) IMPLANT
SUT SILK 2 0 SH CR/8 (SUTURE) IMPLANT
SUT SILK 3 0 REEL (SUTURE) ×2 IMPLANT
SUT SILK 4 0 REEL (SUTURE) ×2 IMPLANT
SUT VIC AB 3-0 FS2 27 (SUTURE) IMPLANT
SUT VICRYL 4-0 PS2 18IN ABS (SUTURE) IMPLANT
TRAY ENT MC OR (CUSTOM PROCEDURE TRAY) ×2 IMPLANT
WATER STERILE IRR 1000ML POUR (IV SOLUTION) ×2 IMPLANT

## 2021-06-10 NOTE — Interval H&P Note (Signed)
History and Physical Interval Note:  06/10/2021 11:59 AM  Annette Silva  has presented today for surgery, with the diagnosis of Infected Sialcadentis.  The various methods of treatment have been discussed with the patient and family. After consideration of risks, benefits and other options for treatment, the patient has consented to  Procedure(s): EXCISION SUBMANDIBULAR GLAND (Right) as a surgical intervention.  The patient's history has been reviewed, patient examined, no change in status, stable for surgery.  I have reviewed the patient's chart and labs.  Questions were answered to the patient's satisfaction.     Annette Silva

## 2021-06-10 NOTE — Anesthesia Procedure Notes (Signed)
Procedure Name: Intubation Date/Time: 06/10/2021 12:23 PM Performed by: Michele Rockers, CRNA Pre-anesthesia Checklist: Patient identified, Patient being monitored, Timeout performed, Emergency Drugs available and Suction available Patient Re-evaluated:Patient Re-evaluated prior to induction Oxygen Delivery Method: Circle system utilized Preoxygenation: Pre-oxygenation with 100% oxygen Induction Type: IV induction Ventilation: Mask ventilation without difficulty Laryngoscope Size: Miller and 2 Grade View: Grade I Tube type: Oral Tube size: 7.0 mm Number of attempts: 1 Airway Equipment and Method: Stylet Placement Confirmation: ETT inserted through vocal cords under direct vision, positive ETCO2 and breath sounds checked- equal and bilateral Secured at: 21 cm Tube secured with: Tape Dental Injury: Teeth and Oropharynx as per pre-operative assessment

## 2021-06-10 NOTE — Anesthesia Preprocedure Evaluation (Addendum)
Anesthesia Evaluation  Patient identified by MRN, date of birth, ID band Patient awake    Reviewed: Allergy & Precautions, NPO status , Patient's Chart, lab work & pertinent test results  Airway Mallampati: Intubated  TM Distance: >3 FB Neck ROM: Full    Dental  (+) Teeth Intact, Dental Advisory Given   Pulmonary neg pulmonary ROS,    Pulmonary exam normal breath sounds clear to auscultation       Cardiovascular negative cardio ROS Normal cardiovascular exam Rhythm:Regular Rate:Normal     Neuro/Psych PSYCHIATRIC DISORDERS Anxiety Depression TIA   GI/Hepatic Neg liver ROS, GERD  Medicated and Controlled,  Endo/Other  Hypothyroidism   Renal/GU negative Renal ROS  negative genitourinary   Musculoskeletal negative musculoskeletal ROS (+)   Abdominal   Peds  Hematology negative hematology ROS (+)   Anesthesia Other Findings   Reproductive/Obstetrics                            Anesthesia Physical Anesthesia Plan  ASA: 2  Anesthesia Plan: General   Post-op Pain Management:    Induction: Intravenous  PONV Risk Score and Plan: 3 and Midazolam, Dexamethasone and Ondansetron  Airway Management Planned: Oral ETT  Additional Equipment:   Intra-op Plan:   Post-operative Plan: Extubation in OR  Informed Consent: I have reviewed the patients History and Physical, chart, labs and discussed the procedure including the risks, benefits and alternatives for the proposed anesthesia with the patient or authorized representative who has indicated his/her understanding and acceptance.     Dental advisory given  Plan Discussed with: CRNA  Anesthesia Plan Comments:         Anesthesia Quick Evaluation

## 2021-06-10 NOTE — Anesthesia Postprocedure Evaluation (Signed)
Anesthesia Post Note  Patient: Annette Silva  Procedure(s) Performed: EXCISION RIGHT SUBMANDIBULAR GLAND (Right: Neck)     Patient location during evaluation: PACU Anesthesia Type: General Level of consciousness: awake and alert Pain management: pain level controlled Vital Signs Assessment: post-procedure vital signs reviewed and stable Respiratory status: spontaneous breathing, nonlabored ventilation, respiratory function stable and patient connected to nasal cannula oxygen Cardiovascular status: blood pressure returned to baseline and stable Postop Assessment: no apparent nausea or vomiting Anesthetic complications: no   No notable events documented.  Last Vitals:  Vitals:   06/10/21 1415 06/10/21 1430  BP: (!) 122/94 (!) 144/80  Pulse: (!) 55 (!) 57  Resp: 18 18  Temp:  (!) 36.2 C  SpO2: 100% 100%    Last Pain:  Vitals:   06/10/21 1430  TempSrc:   PainSc: Asleep                 Yul Diana L Taejon Irani

## 2021-06-10 NOTE — Op Note (Signed)
OPERATIVE REPORT  DATE OF SURGERY: 06/10/2021  PATIENT:  Annette Silva,  60 y.o. female  PRE-OPERATIVE DIAGNOSIS: Submandibular sialolithiasis  POST-OPERATIVE DIAGNOSIS: Submandibular sialolithiasis  PROCEDURE:  Procedure(s): EXCISION RIGHT SUBMANDIBULAR GLAND  SURGEON:  Beckie Salts, MD  ASSISTANTS: RNFA  ANESTHESIA:   General   EBL: 50 ml  DRAINS: 10 French round JP  LOCAL MEDICATIONS USED:  None  SPECIMEN: Right submandibular gland with associated calculus  COUNTS:  Correct  PROCEDURE DETAILS: The patient was taken to the operating room and placed on the operating table in the supine position. Following induction of general endotracheal anesthesia, the right neck was prepped and draped in standard fashion.  A marking pen was used to outline an incision about 2 fingerbreadths below the mandible and a transverse skin crease.  Electrocautery was used to incise the skin and subcutaneous tissue as well as through the platysma layer.  Subplatysmal flap was developed superiorly towards the mandible.  The tissue exam revealed the mandibular branch of the facial nerve was reflected up superiorly.  The facial vessels were identified, ligated between clamps and divided .  The gland itself was  surrounded by dense fibrotic scar tissue.  The gland was dissected down inferiorly exposing the mylohyoid muscle which was very inflamed.  This was reflected up superiorly exposing the submandibular ganglion which was then divided and ligated between clamps.  4-0 silk ties were used.  The submandibular duct was identified with the calculus inside.  The duct was ligated between clamps and divided.  The stone was on the specimen side and was exposed.  It was a very large stone approximately 1-1/2 cm in diameter.  There was another associated small piece adjacent to it.  The dissection continued dissecting gland off of the digastric muscle.  The hypoglossal nerve was identified and preserved.  The  lingual artery was ligated between clamps and divided.  The gland was delivered and sent for pathologic evaluation.   Hemostasis was completed using electrocautery and bipolar cautery.  Wound was irrigated with saline.  The drain was exited through a separate stab incision inferiorly and secured in place with nylon suture.  The platysma layer was reapproximated with interrupted 4-0 chromic.  A running 4-0 chromic subcuticular closure was accomplished and Dermabond was used on the skin.  Patient was awakened extubated and transferred to recovery in stable condition    PATIENT DISPOSITION:  To PACU, stable

## 2021-06-10 NOTE — Transfer of Care (Signed)
Immediate Anesthesia Transfer of Care Note  Patient: Pegeen Stiger  Procedure(s) Performed: EXCISION RIGHT SUBMANDIBULAR GLAND (Right: Neck)  Patient Location: PACU  Anesthesia Type:General  Level of Consciousness: drowsy, patient cooperative and responds to stimulation  Airway & Oxygen Therapy: Patient Spontanous Breathing and Patient connected to nasal cannula oxygen  Post-op Assessment: Report given to RN, Post -op Vital signs reviewed and stable and Patient moving all extremities X 4  Post vital signs: Reviewed and stable  Last Vitals:  Vitals Value Taken Time  BP 136/84 06/10/21 1344  Temp    Pulse 75 06/10/21 1345  Resp 18 06/10/21 1345  SpO2 100 % 06/10/21 1345  Vitals shown include unvalidated device data.  Last Pain:  Vitals:   06/10/21 1103  TempSrc: Oral  PainSc:       Patients Stated Pain Goal: 0 (46/[id number]/63 [id number])  Complications: No notable events documented.

## 2021-06-10 NOTE — Plan of Care (Signed)
  Problem: Health Behavior/Discharge Planning: Goal: Ability to manage health-related needs will improve Outcome: Progressing   Problem: Clinical Measurements: Goal: Ability to maintain clinical measurements within normal limits will improve Outcome: Progressing   Problem: Pain Managment: Goal: General experience of comfort will improve Outcome: Progressing   Problem: Skin Integrity: Goal: Risk for impaired skin integrity will decrease Outcome: Progressing

## 2021-06-10 NOTE — Progress Notes (Signed)
PROGRESS NOTE    Annette Silva  OEH:[id number] DOB: 04-21-1961 DOA: 06/06/2021 PCP: Adaline Sill, NP    Brief Narrative:  60 year old Caucasian female past medical history significant for hypothyroidism and right submandibular sialolithiasis.  Patient was admitted with right submandibular sialoadenitis, with associated increased pain and odynophagia.  According to the patient, the symptoms started around mid August of this year but the symptoms got worse prior to admit. Pt was noted to have R sialadenitis with associated edema. Pt had been continued on steroids with ENT following.   Assessment & Plan:   Active Problems:   Sialolithiasis of submandibular gland   Acute bacterial sialadenitis   Sialoadenitis  Right submandibular sialadenitis with impacted 11 mm stone -ENT had been following -Pt is continued on IV Decadron 8 Mg every 8 hours and Unasyn 3 g every 6 hours. -Pt is now s/p excision of R submandibular gland on 10/11, appreciate ENT assistance  Odynophagia: -Improving. -cont to advance diet as tolerated   Nausea:  -Patient has been on IV Decadron. -Continue IV Protonix as tolerated -Improved.   Hypothyroidism Currently on IV Synthroid 37.5 mcg daily    DVT prophylaxis: Lovenox subQ Code Status: Full Family Communication: Pt in room, family currently at bedside  Status is: Inpatient  Remains inpatient appropriate because:Inpatient level of care appropriate due to severity of illness  Dispo: The patient is from: Home              Anticipated d/c is to: Home              Patient currently is not medically stable to d/c.   Difficult to place patient No   Consultants:  ENT  Procedures:  Excision of R submandibular gland 10/11  Antimicrobials: Anti-infectives (From admission, onward)    Start     Dose/Rate Route Frequency Ordered Stop   06/06/21 1445  Ampicillin-Sulbactam (UNASYN) 3 g in sodium chloride 0.9 % 100 mL IVPB        3 g 200 mL/hr over  30 Minutes Intravenous Every 6 hours 06/06/21 1435     06/06/21 1430  clindamycin (CLEOCIN) IVPB 600 mg  Status:  Discontinued        600 mg 100 mL/hr over 30 Minutes Intravenous  Once 06/06/21 1422 06/06/21 1434       Subjective: Seen post-op. Complains of some mild post-op neck discomfort  Objective: Vitals:   06/10/21 1345 06/10/21 1400 06/10/21 1415 06/10/21 1430  BP: 136/84 136/89 (!) 122/94 (!) 144/80  Pulse: 72 61 (!) 55 (!) 57  Resp: 15 13 18 18   Temp: (!) 97 F (36.1 C)   (!) 97.1 F (36.2 C)  TempSrc:      SpO2: 100% 100% 100% 100%  Weight:      Height:        Intake/Output Summary (Last 24 hours) at 06/10/2021 1619 Last data filed at 06/10/2021 1500 Gross per 24 hour  Intake 3371.65 ml  Output 627 ml  Net 2744.65 ml    Filed Weights   06/07/21 2129 06/09/21 2035 06/10/21 1103  Weight: 69.5 kg 71.5 kg 71.5 kg    Examination: General exam: Conversant, in no acute distress Respiratory system: normal chest rise, clear, no audible wheezing Cardiovascular system: regular rhythm, s1-s2 Gastrointestinal system: Nondistended, nontender, pos BS Central nervous system: No seizures, no tremors Extremities: No cyanosis, no joint deformities Skin: No rashes, no pallor Psychiatry: Affect normal // no auditory hallucinations   Data Reviewed: I have personally  reviewed following labs and imaging studies  CBC: Recent Labs  Lab 06/06/21 1053 06/06/21 1633 06/09/21 0401  WBC 7.3 7.2 7.3  NEUTROABS 5.5  --  5.9  HGB 13.6 11.8* 11.5*  HCT 40.8 35.4* 34.1*  MCV 87.4 87.2 85.9  PLT 275 258 546    Basic Metabolic Panel: Recent Labs  Lab 06/06/21 1053 06/06/21 1633 06/07/21 0338 06/09/21 0401  NA 139  --  138 141  K 4.2  --  4.0 3.6  CL 106  --  106 108  CO2 25  --  24 27  GLUCOSE 102*  --  157* 145*  BUN 9  --  15 17  CREATININE 0.84 0.83 0.86 0.91  CALCIUM 9.4  --  9.0 8.6*  MG  --   --   --  2.0  PHOS  --   --   --  2.7    GFR: Estimated  Creatinine Clearance: 65 mL/min (by C-G formula based on SCr of 0.91 mg/dL). Liver Function Tests: Recent Labs  Lab 06/09/21 0401  ALBUMIN 2.9*    No results for input(s): LIPASE, AMYLASE in the last 168 hours. No results for input(s): AMMONIA in the last 168 hours. Coagulation Profile: No results for input(s): INR, PROTIME in the last 168 hours. Cardiac Enzymes: No results for input(s): CKTOTAL, CKMB, CKMBINDEX, TROPONINI in the last 168 hours. BNP (last 3 results) No results for input(s): PROBNP in the last 8760 hours. HbA1C: No results for input(s): HGBA1C in the last 72 hours. CBG: No results for input(s): GLUCAP in the last 168 hours. Lipid Profile: No results for input(s): CHOL, HDL, LDLCALC, TRIG, CHOLHDL, LDLDIRECT in the last 72 hours. Thyroid Function Tests: No results for input(s): TSH, T4TOTAL, FREET4, T3FREE, THYROIDAB in the last 72 hours. Anemia Panel: No results for input(s): VITAMINB12, FOLATE, FERRITIN, TIBC, IRON, RETICCTPCT in the last 72 hours. Sepsis Labs: No results for input(s): PROCALCITON, LATICACIDVEN in the last 168 hours.  Recent Results (from the past 240 hour(s))  Resp Panel by RT-PCR (Flu A&B, Covid) Nasopharyngeal Swab     Status: None   Collection Time: 06/06/21  3:25 PM   Specimen: Nasopharyngeal Swab; Nasopharyngeal(NP) swabs in vial transport medium  Result Value Ref Range Status   SARS Coronavirus 2 by RT PCR NEGATIVE NEGATIVE Final    Comment: (NOTE) SARS-CoV-2 target nucleic acids are NOT DETECTED.  The SARS-CoV-2 RNA is generally detectable in upper respiratory specimens during the acute phase of infection. The lowest concentration of SARS-CoV-2 viral copies this assay can detect is 138 copies/mL. A negative result does not preclude SARS-Cov-2 infection and should not be used as the sole basis for treatment or other patient management decisions. A negative result may occur with  improper specimen collection/handling, submission of  specimen other than nasopharyngeal swab, presence of viral mutation(s) within the areas targeted by this assay, and inadequate number of viral copies(<138 copies/mL). A negative result must be combined with clinical observations, patient history, and epidemiological information. The expected result is Negative.  Fact Sheet for Patients:  EntrepreneurPulse.com.au  Fact Sheet for Healthcare Providers:  IncredibleEmployment.be  This test is no t yet approved or cleared by the [place] FDA and  has been authorized for detection and/or diagnosis of SARS-CoV-2 by FDA under an Emergency Use Authorization (EUA). This EUA will remain  in effect (meaning this test can be used) for the duration of the COVID-19 declaration under Section 564(b)(1) of the Act, 21 U.S.C.section 360bbb-3(b)(1), unless the authorization  is terminated  or revoked sooner.       Influenza A by PCR NEGATIVE NEGATIVE Final   Influenza B by PCR NEGATIVE NEGATIVE Final    Comment: (NOTE) The Xpert Xpress SARS-CoV-2/FLU/RSV plus assay is intended as an aid in the diagnosis of influenza from Nasopharyngeal swab specimens and should not be used as a sole basis for treatment. Nasal washings and aspirates are unacceptable for Xpert Xpress SARS-CoV-2/FLU/RSV testing.  Fact Sheet for Patients: EntrepreneurPulse.com.au  Fact Sheet for Healthcare Providers: IncredibleEmployment.be  This test is not yet approved or cleared by the [place] FDA and has been authorized for detection and/or diagnosis of SARS-CoV-2 by FDA under an Emergency Use Authorization (EUA). This EUA will remain in effect (meaning this test can be used) for the duration of the COVID-19 declaration under Section 564(b)(1) of the Act, 21 U.S.C. section 360bbb-3(b)(1), unless the authorization is terminated or revoked.  Performed at [facility], [place] [address],  [place], [place] [address]   Surgical pcr screen     Status: None   Collection Time: 06/09/21  3:25 PM   Specimen: Nasal Mucosa; Nasal Swab  Result Value Ref Range Status   MRSA, PCR NEGATIVE NEGATIVE Final   Staphylococcus aureus NEGATIVE NEGATIVE Final    Comment: (NOTE) The Xpert SA Assay (FDA approved for NASAL specimens in patients 96 years of age and older), is one component of a comprehensive surveillance program. It is not intended to diagnose infection nor to guide or monitor treatment. Performed at [facility], [place] [address], [place], [place] [address]       Radiology Studies: No results found.  Scheduled Meds:  dexamethasone (DECADRON) injection  8 mg Intravenous Q8H   enoxaparin (LOVENOX) injection  40 mg Subcutaneous Q24H   fentaNYL       levothyroxine  37.5 mcg Intravenous Q24H    morphine injection  2 mg Intravenous Once   Continuous Infusions:  ampicillin-sulbactam (UNASYN) IV 0 g (06/10/21 0535)   dextrose 5 % and 0.9% NaCl 75 mL/hr at 06/09/21 1947     LOS: 3 days   Marylu Lund, MD [facility] Pager On Amion  If 7PM-7AM, please contact night-coverage 06/10/2021, 4:19 PM

## 2021-06-11 ENCOUNTER — Encounter (HOSPITAL_COMMUNITY): Payer: Self-pay | Admitting: Otolaryngology

## 2021-06-11 DIAGNOSIS — K1120 Sialoadenitis, unspecified: Secondary | ICD-10-CM | POA: Diagnosis not present

## 2021-06-11 LAB — COMPREHENSIVE METABOLIC PANEL
ALT: 17 U/L (ref 0–44)
AST: 18 U/L (ref 15–41)
Albumin: 2.9 g/dL — ABNORMAL LOW (ref 3.5–5.0)
Alkaline Phosphatase: 64 U/L (ref 38–126)
Anion gap: 7 (ref 5–15)
BUN: 13 mg/dL (ref 8–23)
CO2: 26 mmol/L (ref 22–32)
Calcium: 8.3 mg/dL — ABNORMAL LOW (ref 8.9–10.3)
Chloride: 107 mmol/L (ref 98–111)
Creatinine, Ser: 0.86 mg/dL (ref 0.44–1.00)
GFR, Estimated: >60 mL/min (ref >60)
Glucose, Bld: 154 mg/dL — ABNORMAL HIGH (ref 70–99)
Potassium: 3.8 mmol/L (ref 3.5–5.1)
Sodium: 140 mmol/L (ref 135–145)
Total Bilirubin: 0.4 mg/dL (ref 0.3–1.2)
Total Protein: 5.2 g/dL — ABNORMAL LOW (ref 6.5–8.1)

## 2021-06-11 LAB — CBC
HCT: 35.3 % — ABNORMAL LOW (ref 36.0–46.0)
Hemoglobin: 12.2 g/dL (ref 12.0–15.0)
MCH: 29.3 pg (ref 26.0–34.0)
MCHC: 34.6 g/dL (ref 30.0–36.0)
MCV: 84.9 fL (ref 80.0–100.0)
Platelets: 277 10*3/uL (ref 150–400)
RBC: 4.16 MIL/uL (ref 3.87–5.11)
RDW: 12.6 % (ref 11.5–15.5)
WBC: 9.7 10*3/uL (ref 4.0–10.5)
nRBC: 0 % (ref 0.0–0.2)

## 2021-06-11 LAB — SURGICAL PATHOLOGY

## 2021-06-11 MED ORDER — AMOXICILLIN-POT CLAVULANATE 875-125 MG PO TABS: 1.0000 | ORAL_TABLET | Freq: Two times a day (BID) | ORAL | 0 refills | Status: AC

## 2021-06-11 MED ORDER — HYDROCODONE-ACETAMINOPHEN 7.5-325 MG PO TABS
1.0000 | ORAL_TABLET | Freq: Four times a day (QID) | ORAL | Status: DC | PRN
  Administered 2021-06-11: 2 via ORAL
  Filled 2021-06-11: qty 2

## 2021-06-11 NOTE — Discharge Summary (Signed)
Physician Discharge Summary  Annette Silva PPJ:[id number] DOB: 24-Mar-1961 DOA: 06/06/2021  PCP: Adaline Sill, NP  Admit date: 06/06/2021  Discharge date: 06/11/2021  Admitted From: Home.  Disposition: Home.  Recommendations for Outpatient Follow-up:  Follow up with PCP in 1-2 weeks. Please obtain BMP/CBC in one week. Advised to follow up ENT in one week. Advised to take augmentin twice daily for 2 more days.  Home Health: None Equipment/Devices: None  Discharge Condition: Stable CODE STATUS:Full code Diet recommendation: Heart Healthy   Brief Summary / Hospital Course: This 60 year old Caucasian female with PMH significant for hypothyroidism and right submandibular sialolithiasis. Patient was admitted with right submandibular sialoadenitis, with associated increased pain and odynophagia.  According to the patient, the symptoms started around mid August of this year but the symptoms got worse prior to presenting in the ED. ENT was consulted.  Patient started on IV steroids.  Patient underwent excision of right submandibular gland on 10/11, tolerated well.  Patient was continued on IV Unasyn.  Patient feels better, Patient is cleared from ENT to be discharged on Augmentin for 2 more days.  Patient will follow up with ENT in 1 week.  Patient feels better and wants to be discharged.  She was managed for below problems.  Discharge Diagnoses:  Active Problems:   Sialolithiasis of submandibular gland   Acute bacterial sialadenitis   Sialoadenitis  Right submandibular sialadenitis with impacted 11 mm stone ENT was consulted. Pt is continued on IV Decadron 8 Mg every 8 hours and Unasyn 3 g every 6 hours. Pt is now s/p excision of R submandibular gland on 10/11, appreciate ENT assistance. Patient is cleared from ENT to be discharged on Augmentin for 2 more days.   Odynophagia: Improved Advance diet to soft bland diet patient tolerated well.   Nausea:  > Resolved Patient  has been on IV Decadron. Continue IV Protonix as tolerated Improved.   Hypothyroidism Continue Synthroid.  Discharge Instructions  Discharge Instructions     Call MD for:  difficulty breathing, headache or visual disturbances   Complete by: As directed    Call MD for:  persistant nausea and vomiting   Complete by: As directed    Call MD for:  severe uncontrolled pain   Complete by: As directed    Diet - low sodium heart healthy   Complete by: As directed    Diet - low sodium heart healthy   Complete by: As directed    Diet Carb Modified   Complete by: As directed    Discharge instructions   Complete by: As directed    Advised to follow up PCP in one week. Advised to follow up ENT in one week. Advised to take augmentin twice daily for 2 more days.   Discharge wound care:   Complete by: As directed    Follow up ENT.   Discharge wound care:   Complete by: As directed    Follow-up ENT.   Increase activity slowly   Complete by: As directed    Increase activity slowly   Complete by: As directed       Allergies as of 06/11/2021       Reactions   Benadryl [diphenhydramine Hcl (sleep)]    HYPER        Medication List     STOP taking these medications    Fish Oil 1200 MG Caps   mirtazapine 7.5 MG tablet Commonly known as: REMERON   omeprazole 40 MG capsule Commonly known as:  PRILOSEC       TAKE these medications    amoxicillin-clavulanate 875-125 MG tablet Commonly known as: Augmentin Take 1 tablet by mouth 2 (two) times daily for 2 days. What changed: additional instructions   APPLE CIDER VINEGAR PO Take 2 tablets by mouth daily.   CALCIUM 1200 PO Take 1 tablet by mouth daily.   CENTRUM SILVER ADULT 50+ PO Take 1 tablet by mouth daily.   ibuprofen 200 MG tablet Commonly known as: ADVIL Take 200 mg by mouth every 6 (six) hours as needed for mild pain.   levothyroxine 75 MCG tablet Commonly known as: SYNTHROID TAKE 1 TABLET ONCE DAILY  BEFORE BREAKFAST What changed: See the new instructions.               Discharge Care Instructions  (From admission, onward)           Start     Ordered   06/11/21 0000  Discharge wound care:       Comments: Follow up ENT.   06/11/21 1004   06/11/21 0000  Discharge wound care:       Comments: Follow-up ENT.   06/11/21 1042            Follow-up Information     Izora Gala, MD. Schedule an appointment as soon as possible for a visit in 1 week(s).   Specialty: Otolaryngology Contact information: [address] [place] [address] [phone]         Adaline Sill, NP Follow up in 1 week(s).   Specialty: Internal Medicine Contact information: [address] [place] [address] [phone]                Allergies  Allergen Reactions   Benadryl [Diphenhydramine Hcl (Sleep)]     HYPER    Consultations: ENT   Procedures/Studies: CT Soft Tissue Neck W Contrast  Result Date: 06/06/2021 CLINICAL DATA:  Foreign body suspected, unable to swallow, silo adenitis of the right submandibular gland EXAM: CT NECK WITH CONTRAST TECHNIQUE: Multidetector CT imaging of the neck was performed using the standard protocol following the bolus administration of intravenous contrast. CONTRAST:  75mL OMNIPAQUE IOHEXOL 300 MG/ML  SOLN COMPARISON:  06/02/2021. FINDINGS: Pharynx and larynx: Mild mass effect from edema related to the known right submandibular sialadenitis, with leftward deviation of the hypopharynx and edema extending into the epiglottis, with effacement of the right vallecula and piriform recess. Salivary glands: The parotid glands and left submandibular gland are normal. 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 Unchanged appearance of the dominant stone within the submandibular gland, which measures 11 mm, with  additional more distal 5 mm stone in the duct. Possible low-density collection with mild surrounding enhancement inferior to the dominant stone (series 100, image 44), which was not present on the prior exam, measuring approximately 1.6 x 2.3 x 1.8 cm. Increased edema and fat stranding about the right submandibular gland, particularly along the inferior aspect Thyroid: Normal. Lymph nodes: Prominent right level 1A, 1B, and 2A lymph nodes, likely reactive. Vascular: Negative. Limited intracranial: Negative. Visualized orbits: Not included in the field of view. Mastoids and visualized paranasal sinuses: Clear. Skeleton: No acute osseous abnormality. Upper chest: Negative. Other: None. IMPRESSION: 1. Redemonstrated right sialadenitis, with increased associated edema. Possible low-density collection with surrounding enhancement inferior to the dominant stone, concerning for phlegmon or developing abscess. Increased associated fat  stranding and superficial edema. 2. Edema from aforementioned sialadenitis extends into the hypopharynx and causes leftward deviation of the airway, without significant airway narrowing. These results were called by telephone at the time of interpretation on 06/06/2021 at 1:50 pm to provider ABIGAIL HARRIS , who verbally acknowledged these results. Electronically Signed   By: Merilyn Baba M.D.   On: 06/06/2021 13:52   CT SOFT TISSUE NECK W CONTRAST  Result Date: 06/02/2021 CLINICAL DATA:  Sialoadenitis of the submandibular gland on the right. Creatinine was obtained on site at [facility] at [address] Results: Creatinine 1.0 mg/dL. EXAM: CT NECK WITH CONTRAST TECHNIQUE: Multidetector CT imaging of the neck was performed using the standard protocol following the bolus administration of intravenous contrast. CONTRAST:  62mL ISOVUE-300 IOPAMIDOL (ISOVUE-300) INJECTION 61% COMPARISON:  None. FINDINGS: Pharynx and larynx: No mucosal or submucosal lesion is seen. Salivary  glands: Parotid glands are normal. Left submandibular gland is normal. The right submandibular gland shows enlargement, areas of fatty change and some surrounding fluid, consistent with acute on chronic sialoadenitis. There is a dominant stone within the substance of the submandibular gland measuring 11.6 mm in diameter. There is a smaller stone in the more distal duct measuring maximally 5 mm, proximally 1 cm from the papilla. Thyroid: Normal Lymph nodes: Few reactive level 1 lymph nodes on the right. Vascular: Normal Limited intracranial: Normal Visualized orbits: Normal Mastoids and visualized paranasal sinuses: Clear Skeleton: Cervical spondylosis at C5-6. 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 11.6 cm stone in the substance of the gland. 5 mm stone in the midportion of the submandibular duct. Electronically Signed   By: Nelson Chimes M.D.   On: 06/02/2021 16:54      Subjective: Patient is seen and examined at bedside.  Overnight events noted.   Patient reports feeling much improved.  Patient is being discharged home.  She wants to be discharged  Discharge Exam: Vitals:   06/11/21 0428 06/11/21 0917  BP: 126/72 (!) 155/81  Pulse: (!) 47 60  Resp: 17 16  Temp: 97.6 F (36.4 C) 97.7 F (36.5 C)  SpO2: 94% 98%   Vitals:   06/10/21 1815 06/10/21 2024 06/11/21 0428 06/11/21 0917  BP: 138/78 137/85 126/72 (!) 155/81  Pulse: 60 (!) 58 (!) 47 60  Resp:  17 17 16   Temp: 97.6 F (36.4 C) 97.7 F (36.5 C) 97.6 F (36.4 C) 97.7 F (36.5 C)  TempSrc:  Oral Oral Oral  SpO2:  100% 94% 98%  Weight:      Height:        General: Appears comfortable, not in any acute distress. Cardiovascular: S1-S2 heard, regular rate and rhythm, no murmur. Respiratory: Clear to auscultation bilaterally, no wheezing, no rhonchi. Abdominal: Soft, NT, ND, bowel sounds + Extremities: no edema, no cyanosis    The results of significant  diagnostics from this hospitalization (including imaging, microbiology, ancillary and laboratory) are listed below for reference.     Microbiology: Recent Results (from the past 240 hour(s))  Resp Panel by RT-PCR (Flu A&B, Covid) Nasopharyngeal Swab     Status: None   Collection Time: 06/06/21  3:25 PM   Specimen: Nasopharyngeal Swab; Nasopharyngeal(NP) swabs in vial transport medium  Result Value Ref Range Status   SARS Coronavirus 2 by RT PCR NEGATIVE NEGATIVE Final    Comment: (NOTE) SARS-CoV-2 target nucleic acids are NOT DETECTED.  The SARS-CoV-2 RNA is generally detectable in upper respiratory  specimens during the acute phase of infection. The lowest concentration of SARS-CoV-2 viral copies this assay can detect is 138 copies/mL. A negative result does not preclude SARS-Cov-2 infection and should not be used as the sole basis for treatment or other patient management decisions. A negative result may occur with  improper specimen collection/handling, submission of specimen other than nasopharyngeal swab, presence of viral mutation(s) within the areas targeted by this assay, and inadequate number of viral copies(<138 copies/mL). A negative result must be combined with clinical observations, patient history, and epidemiological information. The expected result is Negative.  Fact Sheet for Patients:  EntrepreneurPulse.com.au  Fact Sheet for Healthcare Providers:  IncredibleEmployment.be  This test is no t yet approved or cleared by the [place] FDA and  has been authorized for detection and/or diagnosis of SARS-CoV-2 by FDA under an Emergency Use Authorization (EUA). This EUA will remain  in effect (meaning this test can be used) for the duration of the COVID-19 declaration under Section 564(b)(1) of the Act, 21 U.S.C.section 360bbb-3(b)(1), unless the authorization is terminated  or revoked sooner.       Influenza A by PCR NEGATIVE  NEGATIVE Final   Influenza B by PCR NEGATIVE NEGATIVE Final    Comment: (NOTE) The Xpert Xpress SARS-CoV-2/FLU/RSV plus assay is intended as an aid in the diagnosis of influenza from Nasopharyngeal swab specimens and should not be used as a sole basis for treatment. Nasal washings and aspirates are unacceptable for Xpert Xpress SARS-CoV-2/FLU/RSV testing.  Fact Sheet for Patients: EntrepreneurPulse.com.au  Fact Sheet for Healthcare Providers: IncredibleEmployment.be  This test is not yet approved or cleared by the [place] FDA and has been authorized for detection and/or diagnosis of SARS-CoV-2 by FDA under an Emergency Use Authorization (EUA). This EUA will remain in effect (meaning this test can be used) for the duration of the COVID-19 declaration under Section 564(b)(1) of the Act, 21 U.S.C. section 360bbb-3(b)(1), unless the authorization is terminated or revoked.  Performed at [facility], [place] [address] [place], [place] [address]   Surgical pcr screen     Status: None   Collection Time: 06/09/21  3:25 PM   Specimen: Nasal Mucosa; Nasal Swab  Result Value Ref Range Status   MRSA, PCR NEGATIVE NEGATIVE Final   Staphylococcus aureus NEGATIVE NEGATIVE Final    Comment: (NOTE) The Xpert SA Assay (FDA approved for NASAL specimens in patients 41 years of age and older), is one component of a comprehensive surveillance program. It is not intended to diagnose infection nor to guide or monitor treatment. Performed at [facility], [place] [address], [place], [place] [address]      Labs: BNP (last 3 results) No results for input(s): BNP in the last 8760 hours. Basic Metabolic Panel: Recent Labs  Lab 06/06/21 1053 06/06/21 1633 06/07/21 0338 06/09/21 0401 06/11/21 0316  NA 139  --  138 141 140  K 4.2  --  4.0 3.6 3.8  CL 106  --  106 108 107  CO2 25  --  24 27 26   GLUCOSE 102*  --  157* 145* 154*  BUN 9  --  15 17  13   CREATININE 0.84 0.83 0.86 0.91 0.86  CALCIUM 9.4  --  9.0 8.6* 8.3*  MG  --   --   --  2.0  --   PHOS  --   --   --  2.7  --    Liver Function Tests: Recent Labs  Lab  06/09/21 0401 06/11/21 0316  AST  --  18  ALT  --  17  ALKPHOS  --  64  BILITOT  --  0.4  PROT  --  5.2*  ALBUMIN 2.9* 2.9*   No results for input(s): LIPASE, AMYLASE in the last 168 hours. No results for input(s): AMMONIA in the last 168 hours. CBC: Recent Labs  Lab 06/06/21 1053 06/06/21 1633 06/09/21 0401 06/11/21 0316  WBC 7.3 7.2 7.3 9.7  NEUTROABS 5.5  --  5.9  --   HGB 13.6 11.8* 11.5* 12.2  HCT 40.8 35.4* 34.1* 35.3*  MCV 87.4 87.2 85.9 84.9  PLT 275 258 280 277   Cardiac Enzymes: No results for input(s): CKTOTAL, CKMB, CKMBINDEX, TROPONINI in the last 168 hours. BNP: Invalid input(s): POCBNP CBG: No results for input(s): GLUCAP in the last 168 hours. D-Dimer No results for input(s): DDIMER in the last 72 hours. Hgb A1c No results for input(s): HGBA1C in the last 72 hours. Lipid Profile No results for input(s): CHOL, HDL, LDLCALC, TRIG, CHOLHDL, LDLDIRECT in the last 72 hours. Thyroid function studies No results for input(s): TSH, T4TOTAL, T3FREE, THYROIDAB in the last 72 hours.  Invalid input(s): FREET3 Anemia work up No results for input(s): VITAMINB12, FOLATE, FERRITIN, TIBC, IRON, RETICCTPCT in the last 72 hours. Urinalysis    Component Value Date/Time   APPEARANCEUR Clear 10/14/2018 1202   GLUCOSEU Negative 10/14/2018 1202   BILIRUBINUR Negative 10/14/2018 1202   PROTEINUR Negative 10/14/2018 1202   NITRITE Negative 10/14/2018 1202   LEUKOCYTESUR Negative 10/14/2018 1202   Sepsis Labs Invalid input(s): PROCALCITONIN,  WBC,  LACTICIDVEN Microbiology Recent Results (from the past 240 hour(s))  Resp Panel by RT-PCR (Flu A&B, Covid) Nasopharyngeal Swab     Status: None   Collection Time: 06/06/21  3:25 PM   Specimen: Nasopharyngeal Swab; Nasopharyngeal(NP) swabs in vial  transport medium  Result Value Ref Range Status   SARS Coronavirus 2 by RT PCR NEGATIVE NEGATIVE Final    Comment: (NOTE) SARS-CoV-2 target nucleic acids are NOT DETECTED.  The SARS-CoV-2 RNA is generally detectable in upper respiratory specimens during the acute phase of infection. The lowest concentration of SARS-CoV-2 viral copies this assay can detect is 138 copies/mL. A negative result does not preclude SARS-Cov-2 infection and should not be used as the sole basis for treatment or other patient management decisions. A negative result may occur with  improper specimen collection/handling, submission of specimen other than nasopharyngeal swab, presence of viral mutation(s) within the areas targeted by this assay, and inadequate number of viral copies(<138 copies/mL). A negative result must be combined with clinical observations, patient history, and epidemiological information. The expected result is Negative.  Fact Sheet for Patients:  EntrepreneurPulse.com.au  Fact Sheet for Healthcare Providers:  IncredibleEmployment.be  This test is no t yet approved or cleared by the [place] FDA and  has been authorized for detection and/or diagnosis of SARS-CoV-2 by FDA under an Emergency Use Authorization (EUA). This EUA will remain  in effect (meaning this test can be used) for the duration of the COVID-19 declaration under Section 564(b)(1) of the Act, 21 U.S.C.section 360bbb-3(b)(1), unless the authorization is terminated  or revoked sooner.       Influenza A by PCR NEGATIVE NEGATIVE Final   Influenza B by PCR NEGATIVE NEGATIVE Final    Comment: (NOTE) The Xpert Xpress SARS-CoV-2/FLU/RSV plus assay is intended as an aid in the diagnosis of influenza from Nasopharyngeal swab specimens and should not be used as  a sole basis for treatment. Nasal washings and aspirates are unacceptable for Xpert Xpress SARS-CoV-2/FLU/RSV testing.  Fact  Sheet for Patients: EntrepreneurPulse.com.au  Fact Sheet for Healthcare Providers: IncredibleEmployment.be  This test is not yet approved or cleared by the [place] FDA and has been authorized for detection and/or diagnosis of SARS-CoV-2 by FDA under an Emergency Use Authorization (EUA). This EUA will remain in effect (meaning this test can be used) for the duration of the COVID-19 declaration under Section 564(b)(1) of the Act, 21 U.S.C. section 360bbb-3(b)(1), unless the authorization is terminated or revoked.  Performed at [facility], [place] [address], [place], [place] [address]   Surgical pcr screen     Status: None   Collection Time: 06/09/21  3:25 PM   Specimen: Nasal Mucosa; Nasal Swab  Result Value Ref Range Status   MRSA, PCR NEGATIVE NEGATIVE Final   Staphylococcus aureus NEGATIVE NEGATIVE Final    Comment: (NOTE) The Xpert SA Assay (FDA approved for NASAL specimens in patients 2 years of age and older), is one component of a comprehensive surveillance program. It is not intended to diagnose infection nor to guide or monitor treatment. Performed at [facility], [place] [address], [place], [place] [address]      Time coordinating discharge: Over 30 minutes  SIGNED:   Shawna Clamp, MD  [facility] 06/11/2021, 3:42 PM Pager   If 7PM-7AM, please contact night-coverage

## 2021-06-11 NOTE — Progress Notes (Addendum)
Postop day 1, doing very well.  Surgical site looks excellent.  Drain removed.  Slight weakness of the right lower lip.  Anticipate recovery of this.  She is complaining of lower back pain more than her surgical pain.  She has been able to eat much better since the surgery.  She may be discharged home when clinically appropriate.  She will follow-up with me as an outpatient in 1-2 weeks.  I would recommend 2 more days of oral antibiotics since the source of the infection is now completely gone.

## 2021-06-11 NOTE — Discharge Instructions (Addendum)
You may shower and use soap and water. Do not use any creams, oils or ointment.  Keep a small dressing on the drain site until there is no further drainage.  Avoid heavy lifting or strenuous activity for 2 weeks.  Advised to follow up ENT in one week. Advised to take augmentin twice daily for 2 more days.

## 2021-06-11 NOTE — TOC Transition Note (Signed)
Transition of Care [facility]) - CM/SW Discharge Note   Patient Details  Name: Annette Silva MRN: [id number] Date of Birth: Jul 07, 1961  Transition of Care [facility]) CM/SW Contact:  Tom-Johnson, Renea Ee, RN Phone Number: 06/11/2021, 1:43 PM   Clinical Narrative:    CM spoke with patient at bedside. Spouse in the room as well. Presented to the ED with some mild chills, no rigors. PMH significant for right submandibular sialadenitis secondary to 2 large 11 mm and 6 mm stones which has been managed as an outpatient by ENT. However stones have not passed. States she lives at home with her husband and has a son whom is very supportive with care. Patient is a retired Glass blower/designer at [employer]. Independent with care prior to hospitalization. Able to drive self. Has a cane at home but does not use it. Has a PCP and Medically Insured by [place]. Denies any needs. Scheduled for discharge. No recommendations noted. No further TOC needs noted.   Final next level of care: Home/Self Care Barriers to Discharge: No Barriers Identified   Patient Goals and CMS Choice Patient states their goals for this hospitalization and ongoing recovery are:: To go home with husband CMS Medicare.gov Compare Post Acute Care list provided to:: Patient Choice offered to / list presented to : NA  Discharge Placement                       Discharge Plan and Services                DME Arranged: N/A DME Agency: NA       HH Arranged: NA HH Agency: NA        Social Determinants of Health (SDOH) Interventions     Readmission Risk Interventions No flowsheet data found.

## 2021-06-11 NOTE — Progress Notes (Signed)
Discharge instructions (including medications) discussed with and copy provided to patient/caregiver 

## 2021-06-11 NOTE — Plan of Care (Signed)
  Problem: Health Behavior/Discharge Planning: Goal: Ability to manage health-related needs will improve Outcome: Adequate for Discharge   

## 2021-06-11 NOTE — Plan of Care (Signed)
  Problem: Health Behavior/Discharge Planning: Goal: Ability to manage health-related needs will improve Outcome: Progressing   Problem: Clinical Measurements: Goal: Ability to maintain clinical measurements within normal limits will improve Outcome: Progressing Goal: Will remain free from infection Outcome: Progressing Goal: Diagnostic test results will improve Outcome: Progressing   Problem: Nutrition: Goal: Adequate nutrition will be maintained Outcome: Progressing   Problem: Coping: Goal: Level of anxiety will decrease Outcome: Progressing   Problem: Pain Managment: Goal: General experience of comfort will improve Outcome: Progressing   Problem: Safety: Goal: Ability to remain free from injury will improve Outcome: Progressing   Problem: Skin Integrity: Goal: Risk for impaired skin integrity will decrease Outcome: Progressing

## 2021-06-12 ENCOUNTER — Telehealth: Payer: Self-pay

## 2021-06-12 NOTE — Telephone Encounter (Signed)
Transition Care Management Follow-up Telephone Call Date of discharge and from where: 10/12 Annette Silva How have you been since you were released from the hospital? " Doing pretty well- just tired" Any questions or concerns? No  Items Reviewed: Did the pt receive and understand the discharge instructions provided? Yes  Medications obtained and verified? Yes  Other? No  Any new allergies since your discharge? No  Dietary orders reviewed? Yes Do you have support at home? Yes   Home Care and Equipment/Supplies: Were home health services ordered? not applicable If so, what is the name of the agency?  Has the agency set up a time to come to the patient's home? not applicable Were any new equipment or medical supplies ordered?  No What is the name of the medical supply agency?  Were you able to get the supplies/equipment? not applicable Do you have any questions related to the use of the equipment or supplies? No  Functional Questionnaire: (I = Independent and D = Dependent) ADLs: I  Bathing/Dressing- I  Meal Prep- I  Eating- I  Maintaining continence- I  Transferring/Ambulation- I  Managing Meds- I  Follow up appointments reviewed:  PCP Hospital f/u appt confirmed? No  Specialist Hospital f/u appt confirmed? No   Are transportation arrangements needed?  If their condition worsens, is the pt aware to call PCP or go to the Emergency Dept.? YES Was the patient provided with contact information for the PCP's office or ED? YES Was to pt encouraged to call back with questions or concerns? Yes  Tomasa Rand, RN, BSN, CEN [facility] [employer] [phone]

## 2021-12-15 ENCOUNTER — Encounter: Payer: Self-pay | Admitting: *Deleted

## 2021-12-16 ENCOUNTER — Ambulatory Visit (INDEPENDENT_AMBULATORY_CARE_PROVIDER_SITE_OTHER): Admitting: Diagnostic Neuroimaging

## 2021-12-16 ENCOUNTER — Encounter: Payer: Self-pay | Admitting: Diagnostic Neuroimaging

## 2021-12-16 VITALS — BP 122/77 | HR 64 | Ht 66.0 in | Wt 151.0 lb

## 2021-12-16 DIAGNOSIS — R200 Anesthesia of skin: Secondary | ICD-10-CM

## 2021-12-16 DIAGNOSIS — R202 Paresthesia of skin: Secondary | ICD-10-CM

## 2021-12-16 NOTE — Progress Notes (Signed)
? ?[facility] ? ?PATIENT: Annette Silva ?DOB: [birth year] ? ?REFERRING CLINICIAN: Adaline Sill, NP ?HISTORY FROM: patient  ?REASON FOR VISIT: new consult  ? ? ?HISTORICAL ? ?CHIEF COMPLAINT:  ?Chief Complaint  ?Patient presents with  ? Facial numbness  ?  Rm 7 Est pt with new issue, friend- Remo Lipps "my neck is numb from surgery on salivary gland; new issue with constant numbness in lower face; intermittent numbness in fingers and toes since Covid which hasn't improved"   ? ? ?HISTORY OF PRESENT ILLNESS:  ? ?UPDATE (12/16/21, VRP): Since last visit, doing well, except had right salivary gland stone and gland removal in Oct 2022. Some post-op numbness in neck and right facial weakness, that has improved. Now having some facial numbness, right to left, in last few weeks.  ? ?PRIOR HPI (01/22/20): 61 year old female here for evaluation of post Covid brain fog and memory loss. ? ?Patient had Covid in November 2020.  She had cough shortness of breath fatigue headaches loss of smell and taste, acid reflux and numbness tingling.  Patient had first Covid vaccine in April 2021 and second vaccine last Friday.  Since then symptoms have been stable.  She continues to have insomnia, feeling cold, fatigue, brain fog, loss of smell and taste.  She sleeps about 5 to 6 hours at night. ? ? ?REVIEW OF SYSTEMS: Full 14 system review of systems performed and negative with exception of: As per HPI. ? ?ALLERGIES: ?Allergies  ?Allergen Reactions  ? Benadryl [Diphenhydramine Hcl (Sleep)]   ?  HYPER  ? ? ?HOME MEDICATIONS: ?Outpatient Medications Prior to Visit  ?Medication Sig Dispense Refill  ? APPLE CIDER VINEGAR PO Take 2 tablets by mouth daily.    ? Calcium Carbonate-Vit D-Min (CALCIUM 1200 PO) Take 1 tablet by mouth daily.    ? ibuprofen (ADVIL) 200 MG tablet Take 200 mg by mouth every 6 (six) hours as needed for mild pain.    ? levothyroxine (SYNTHROID) 75 MCG tablet TAKE 1 TABLET ONCE DAILY BEFORE BREAKFAST  (Patient taking differently: Take 75 mcg by mouth daily.) 90 tablet 2  ? Multiple Vitamins-Minerals (CENTRUM SILVER ADULT 50+ PO) Take 1 tablet by mouth daily.    ? ?No facility-administered medications prior to visit.  ? ? ?PAST MEDICAL HISTORY: ?Past Medical History:  ?Diagnosis Date  ? Allergy   ? Anxiety   ? COVID-19 virus infection 07/2019  ? hx of  ? Depression   ? Facial numbness   ? GERD (gastroesophageal reflux disease)   ? Mini stroke 12/2013  ? Salivary stone 05/2021  ? removed  ? Salivary stones   ? Thyroid disease   ? ? ?PAST SURGICAL HISTORY: ?Past Surgical History:  ?Procedure Laterality Date  ? ABDOMINAL HYSTERECTOMY  11 / 1997  ? partial   ? [place]  ? SUBMANDIBULAR GLAND EXCISION Right 06/10/2021  ? Procedure: EXCISION RIGHT SUBMANDIBULAR GLAND;  Surgeon: Izora Gala, MD;  Location: [place];  Service: ENT;  Laterality: Right;  ? TOOTH EXTRACTION Left   ? ? ?FAMILY HISTORY: ?Family History  ?Problem Relation Age of Onset  ? Stroke Mother   ?     passed at age 55  ? Arthritis Mother   ? COPD Mother   ? Depression Mother   ? Diabetes Mother   ? Hearing loss Mother   ? Heart disease Mother   ? Hypertension Mother   ? Dementia Mother   ? Cancer Father   ?  bone passed at 29  ? ? ?SOCIAL HISTORY: ?Social History  ? ?Socioeconomic History  ? Marital status: Married  ?  Spouse name: Eddie Dibbles  ? Number of children: Not on file  ? Years of education: Not on file  ? Highest education level: High school graduate  ?Occupational History  ? Not on file  ?Tobacco Use  ? Smoking status: Never  ? Smokeless tobacco: Never  ?Vaping Use  ? Vaping Use: Never used  ?Substance and Sexual Activity  ? Alcohol use: Yes  ?  Comment: socially  ? Drug use: No  ? Sexual activity: Never  ?Other Topics Concern  ? Not on file  ?Social History Narrative  ? Lives with husband  ? Caffeine- coffee 4 c daily  ? ?Social Determinants of Health  ? ?Financial Resource Strain: Not on file  ?Food Insecurity: Not on file   ?Transportation Needs: Not on file  ?Physical Activity: Not on file  ?Stress: Not on file  ?Social Connections: Not on file  ?Intimate Partner Violence: Not on file  ? ? ? ?PHYSICAL EXAM ? ?GENERAL EXAM/CONSTITUTIONAL: ?Vitals:  ?Vitals:  ? 12/16/21 1423  ?BP: 122/77  ?Pulse: 64  ?Weight: 151 lb (68.5 kg)  ?Height: '5\' 6"'$  (1.676 m)  ? ?Body mass index is 24.37 kg/m?. ?Wt Readings from Last 3 Encounters:  ?12/16/21 151 lb (68.5 kg)  ?06/10/21 157 lb 10.1 oz (71.5 kg)  ?11/18/20 162 lb 9.6 oz (73.8 kg)  ? ?Patient is in no distress; well developed, nourished and groomed; neck is supple ? ?CARDIOVASCULAR: ?Examination of carotid arteries is normal; no carotid bruits ?Regular rate and rhythm, no murmurs ?Examination of peripheral vascular system by observation and palpation is normal ? ?EYES: ?Ophthalmoscopic exam of optic discs and posterior segments is normal; no papilledema or hemorrhages ?No results found. ? ?MUSCULOSKELETAL: ?Gait, strength, tone, movements noted in Neurologic exam below ? ?NEUROLOGIC: ?MENTAL STATUS:  ? ?  01/22/2020  ?  8:18 AM  ?MMSE - Mini Mental State Exam  ?Orientation to time 5  ?Orientation to Place 4  ?Registration 3  ?Attention/ Calculation 3  ?Recall 2  ?Language- name 2 objects 2  ?Language- repeat 1  ?Language- follow 3 step command 3  ?Language- read & follow direction 1  ?Write a sentence 1  ?Copy design 1  ?Total score 26  ? ?awake, alert, oriented to person, place and time ?recent and remote memory intact ?normal attention and concentration ?language fluent, comprehension intact, naming intact ?fund of knowledge appropriate ? ?CRANIAL NERVE:  ?2nd - no papilledema on fundoscopic exam ?2nd, 3rd, 4th, 6th - pupils equal and reactive to light, visual fields full to confrontation, extraocular muscles intact, no nystagmus ?5th - facial sensation symmetric ?7th - facial strength symmetric ?8th - hearing intact ?9th - palate elevates symmetrically, uvula midline ?11th - shoulder shrug  symmetric ?12th - tongue protrusion midline ? ?MOTOR:  ?normal bulk and tone, full strength in the BUE, BLE ? ?SENSORY:  ?normal and symmetric to light touch, temperature, vibration, pinprick ? ?COORDINATION:  ?finger-nose-finger, fine finger movements normal ? ?REFLEXES:  ?deep tendon reflexes TRACE present and symmetric ? ?GAIT/STATION:  ?narrow based gait ? ? ? ? ?DIAGNOSTIC DATA (LABS, IMAGING, TESTING) ?- I reviewed patient records, labs, notes, testing and imaging myself where available. ? ?Lab Results  ?Component Value Date  ? WBC 9.7 06/11/2021  ? HGB 12.2 06/11/2021  ? HCT 35.3 (L) 06/11/2021  ? MCV 84.9 06/11/2021  ? PLT 277 06/11/2021  ? ?   ?  Component Value Date/Time  ? NA 140 06/11/2021 0316  ? NA 145 (H) 09/10/2020 1609  ? K 3.8 06/11/2021 0316  ? CL 107 06/11/2021 0316  ? CO2 26 06/11/2021 0316  ? GLUCOSE 154 (H) 06/11/2021 0316  ? BUN 13 06/11/2021 0316  ? BUN 12 09/10/2020 1609  ? CREATININE 0.86 06/11/2021 0316  ? CALCIUM 8.3 (L) 06/11/2021 0316  ? PROT 5.2 (L) 06/11/2021 0316  ? PROT 6.5 09/10/2020 1609  ? ALBUMIN 2.9 (L) 06/11/2021 0316  ? ALBUMIN 4.4 09/10/2020 1609  ? AST 18 06/11/2021 0316  ? ALT 17 06/11/2021 0316  ? ALKPHOS 64 06/11/2021 0316  ? BILITOT 0.4 06/11/2021 0316  ? BILITOT 0.3 09/10/2020 1609  ? GFRNONAA >60 06/11/2021 0316  ? GFRAA 76 09/10/2020 1609  ? ?Lab Results  ?Component Value Date  ? CHOL 210 (H) 09/10/2020  ? HDL 75 09/10/2020  ? LDLCALC 119 (H) 09/10/2020  ? TRIG 88 09/10/2020  ? CHOLHDL 2.8 09/10/2020  ? ?No results found for: HGBA1C ?Lab Results  ?Component Value Date  ? VITAMINB12 >2000 (H) 10/25/2019  ? ?Lab Results  ?Component Value Date  ? TSH 1.270 09/10/2020  ? ? ? ? ? ?ASSESSMENT AND PLAN ? ?61 y.o. year old female here with post Covid symptoms of brain fog, loss of smell and taste, headaches, insomnia, feeling cold since November 2020. ? ?Dx: ? ?1. Numbness and tingling   ? ? ? ?PLAN: ? ?FACIAL NUMBNESS ?- unclear etiology; normal neuro exam; some mild anxiety  issues ?- check neuropathy labs ? ?POST-COVID SYNDROME (fatigue, brain fog, numbness, loss of smell / taste) ?- supportive care; brain and physical activities reviewed; sleep and nutrition reviewed ? ?Orders Placed

## 2021-12-17 LAB — COMPREHENSIVE METABOLIC PANEL
ALT: 14 IU/L (ref 0–32)
AST: 22 IU/L (ref 0–40)
Albumin/Globulin Ratio: 2.2 (ref 1.2–2.2)
Albumin: 4.7 g/dL (ref 3.8–4.8)
Alkaline Phosphatase: 95 IU/L (ref 44–121)
BUN/Creatinine Ratio: 15 (ref 12–28)
BUN: 13 mg/dL (ref 8–27)
Bilirubin Total: 0.4 mg/dL (ref 0.0–1.2)
CO2: 26 mmol/L (ref 20–29)
Calcium: 9.9 mg/dL (ref 8.7–10.3)
Chloride: 107 mmol/L — ABNORMAL HIGH (ref 96–106)
Creatinine, Ser: 0.87 mg/dL (ref 0.57–1.00)
Globulin, Total: 2.1 g/dL (ref 1.5–4.5)
Glucose: 90 mg/dL (ref 70–99)
Potassium: 4.5 mmol/L (ref 3.5–5.2)
Sodium: 148 mmol/L — ABNORMAL HIGH (ref 134–144)
Total Protein: 6.8 g/dL (ref 6.0–8.5)
eGFR: 75 mL/min/{1.73_m2} (ref >59)

## 2021-12-17 LAB — HEMOGLOBIN A1C
Est. average glucose Bld gHb Est-mCnc: 105 mg/dL
Hgb A1c MFr Bld: 5.3 % (ref 4.8–5.6)

## 2021-12-17 LAB — VITAMIN B12: Vitamin B-12: 642 pg/mL (ref 232–1245)

## 2021-12-17 LAB — TSH: TSH: 2.08 u[IU]/mL (ref 0.450–4.500)

## 2021-12-30 ENCOUNTER — Institutional Professional Consult (permissible substitution): Admitting: Diagnostic Neuroimaging

## 2022-04-07 ENCOUNTER — Other Ambulatory Visit: Payer: Self-pay | Admitting: Internal Medicine

## 2022-04-07 DIAGNOSIS — Z1231 Encounter for screening mammogram for malignant neoplasm of breast: Secondary | ICD-10-CM

## 2022-04-09 ENCOUNTER — Ambulatory Visit
Admission: RE | Admit: 2022-04-09 | Discharge: 2022-04-09 | Disposition: A | Discharge status: Home / Self Care | Source: Ambulatory Visit | Attending: Internal Medicine | Admitting: Internal Medicine

## 2022-04-09 DIAGNOSIS — Z1231 Encounter for screening mammogram for malignant neoplasm of breast: Secondary | ICD-10-CM

## 2022-04-13 ENCOUNTER — Other Ambulatory Visit: Payer: Self-pay | Admitting: Internal Medicine

## 2022-04-13 DIAGNOSIS — R928 Other abnormal and inconclusive findings on diagnostic imaging of breast: Secondary | ICD-10-CM

## 2022-04-21 ENCOUNTER — Ambulatory Visit
Admission: RE | Admit: 2022-04-21 | Discharge: 2022-04-21 | Disposition: A | Discharge status: Home / Self Care | Source: Ambulatory Visit | Attending: Internal Medicine | Admitting: Internal Medicine

## 2022-04-21 ENCOUNTER — Ambulatory Visit

## 2022-04-21 DIAGNOSIS — R928 Other abnormal and inconclusive findings on diagnostic imaging of breast: Secondary | ICD-10-CM

## 2022-06-18 ENCOUNTER — Encounter: Payer: Self-pay | Admitting: Obstetrics and Gynecology

## 2022-06-18 ENCOUNTER — Ambulatory Visit (INDEPENDENT_AMBULATORY_CARE_PROVIDER_SITE_OTHER): Admitting: Obstetrics and Gynecology

## 2022-06-18 VITALS — BP 122/74 | HR 59 | Ht 66.0 in | Wt 148.0 lb

## 2022-06-18 DIAGNOSIS — R102 Pelvic and perineal pain: Secondary | ICD-10-CM

## 2022-06-18 DIAGNOSIS — N760 Acute vaginitis: Secondary | ICD-10-CM | POA: Diagnosis not present

## 2022-06-18 LAB — WET PREP FOR TRICH, YEAST, CLUE

## 2022-06-18 NOTE — Patient Instructions (Addendum)
Atrophic Vaginitis  Atrophic vaginitis is a condition in which the tissues that line the vagina become dry and thin. This condition is most common in women who have stopped having regular menstrual periods (are in menopause). This usually starts when a woman is 14 to 63 years old. That is the time when a woman's estrogen levels begin to decrease. Estrogen is a female hormone. It helps to keep the tissues of the vagina moist. It stimulates the vagina to produce a clear fluid that lubricates the vagina for sex. This fluid also protects the vagina from infection. Lack of estrogen can cause the lining of the vagina to get thinner and dryer. The vagina may also shrink in size. It may become less elastic. Atrophic vaginitis tends to get worse over time as a woman's estrogen level drops. What are the causes? This condition is caused by the normal drop in estrogen that happens around the time of menopause. What increases the risk? Certain conditions or situations may lower a woman's estrogen level, leading to a higher risk for atrophic vaginitis. You are more likely to develop this condition if: You are taking medicines that block estrogen. You have had your ovaries removed. You are being treated for cancer with radiation or medicines (chemotherapy). You have given birth or are breastfeeding. You are older than age 54. You smoke. What are the signs or symptoms? Symptoms of this condition include: Pain, soreness, a feeling of pressure, or bleeding during sex (dyspareunia). Vaginal burning, irritation, or itching. Pain or bleeding when a speculum is used in a vaginal exam. Having burning pain while urinating. Vaginal discharge. In some cases, there are no symptoms. How is this diagnosed? This condition is diagnosed based on your medical history and a physical exam. This will include a pelvic exam that checks the vaginal tissues. Though rare, you may also have other tests, including: A urine test. A  test that checks the acid balance in your vagina (acid balance test). How is this treated? Treatment for this condition depends on how severe your symptoms are. Treatment may include: Using an over-the-counter vaginal lubricant before sex. Using a long-acting vaginal moisturizer. Using low-dose estrogen for moderate to severe symptoms that do not respond to other treatments. Options include creams, tablets, and inserts (vaginal rings). Before you use a vaginal estrogen, tell your health care provider if you have a history of: Breast cancer. Endometrial cancer. Blood clots. If you are not sexually active and your symptoms are very mild, you may not need treatment.  Consider water based lubricants like [place] Jelly, Replens, or Astroglide.   Cooking oils may also be used inside the vagina:  coconut, olive, or canola oil.  Vaginal vitamin E can also be used for hydration.  You can buy this on [employer].  There is also a product called Good Clean Love for hydration.   Follow these instructions at home: Medicines Take over-the-counter and prescription medicines only as told by your health care provider. Do not use herbal or alternative medicines unless your health care provider says that you can. Use over-the-counter creams, lubricants, or moisturizers for dryness only as told by your health care provider. General instructions If your atrophic vaginitis is caused by menopause, discuss all of your menopause symptoms and treatment options with your health care provider. Do not douche. Do not use products that can make your vagina dry. These include: Scented feminine sprays. Scented tampons. Scented soaps. Vaginal sex can help to improve blood flow and elasticity of vaginal tissue.  If you choose to have sex and it hurts, try using a water-soluble lubricant or moisturizer right before having sex. Contact a health care provider if: Your discharge looks different than normal. Your vagina has an unusual  smell. You have new symptoms. Your symptoms do not improve with treatment. Your symptoms get worse. Summary Atrophic vaginitis is a condition in which the tissues that line the vagina become dry and thin. It is most common in women who have stopped having regular menstrual periods (are in menopause). Treatment options include using vaginal lubricants and low-dose vaginal estrogen. Contact a health care provider if your vagina has an unusual smell, or if your symptoms get worse or do not improve after treatment. This information is not intended to replace advice given to you by your health care provider. Make sure you discuss any questions you have with your health care provider. Document Revised: 02/15/2020 Document Reviewed: 02/15/2020 Elsevier Patient Education  [place].

## 2022-06-18 NOTE — Progress Notes (Signed)
61 y.o. No obstetric history on file. Married Caucasian female here for vaginal discharge. She states that sex is so painful she has not done it in a long time. She says that she doesn't have the discharge all the time.   Symptoms for 2 months.   Itching or burning once or twice.   No external vulvar irritation.   No vaginal bleeding.  Has not done any over the counter treatment.  Has been treated for strep throat.  Her last infection was 1 month ago.   Her current symptoms predate the strep throat.   No elevated blood sugar and no steroid usage.   No new products.   States she lost her sense of taste and smell due to Covid.   Hx TIA in 2015.   PCP: DR Joline Salt   Patient's last menstrual period was 07/01/1996.           Sexually active: No.  The current method of family planning is status post hysterectomy.    Exercising: Yes.     Silver sneakers work out classes daily   Smoker:  no  Health Maintenance: Pap: hx of Hysterectomy in  1998 bleeding History of abnormal Pap:  no MMG:  04/21/22 density C Bi-rads 1 neg  Colonoscopy:  03/24/21 normal f/u 10 years  BMD:   2018  Result  osteopenia  TDaP:  02/20/2013 Gardasil:   no HIV: unsure  Hep C: unsure  Screening Labs: PCP  Hb today: na, Urine today:    reports that she has never smoked. She has never used smokeless tobacco. She reports current alcohol use. She reports that she does not use drugs.  Past Medical History:  Diagnosis Date   Allergy    Anxiety    COVID-19 virus infection 07/2019   hx of   Depression    Facial numbness    GERD (gastroesophageal reflux disease)    Mini stroke 12/2013   Salivary stone 05/2021   removed   Salivary stones    Thyroid disease     Past Surgical History:  Procedure Laterality Date   ABDOMINAL HYSTERECTOMY  11 / 1997   partial    [place] Right 06/10/2021   Procedure: EXCISION RIGHT SUBMANDIBULAR GLAND;  Surgeon: Izora Gala, MD;  Location: [facility];  Service: ENT;  Laterality: Right;   TOOTH EXTRACTION Left     Current Outpatient Medications  Medication Sig Dispense Refill   APPLE CIDER VINEGAR PO Take 2 tablets by mouth daily.     Calcium Carbonate-Vit D-Min (CALCIUM 1200 PO) Take 1 tablet by mouth daily.     ibuprofen (ADVIL) 200 MG tablet Take 200 mg by mouth every 6 (six) hours as needed for mild pain.     levothyroxine (SYNTHROID) 75 MCG tablet TAKE 1 TABLET ONCE DAILY BEFORE BREAKFAST (Patient taking differently: Take 75 mcg by mouth daily.) 90 tablet 2   traZODone (DESYREL) 100 MG tablet 1/2 to 1 tablet at bedtime Orally Once a day for 30 day(s)     valACYclovir (VALTREX) 1000 MG tablet TAKE ONE TABLET THREE TIMES DAILY FOR 7 DAYS     Multiple Vitamins-Minerals (CENTRUM SILVER ADULT 50+ PO) Take 1 tablet by mouth daily.     No current facility-administered medications for this visit.    Family History  Problem Relation Age of Onset   Stroke Mother        passed at age 72   Arthritis Mother  COPD Mother    Depression Mother    Diabetes Mother    Hearing loss Mother    Heart disease Mother    Hypertension Mother    Dementia Mother    Cancer Father        bone passed at 93    Review of Systems  Genitourinary:  Positive for vaginal discharge and vaginal pain.  All other systems reviewed and are negative.   Exam:   BP 122/74   Pulse (!) 59   Ht '5\' 6"'$  (1.676 m)   Wt 148 lb (67.1 kg)   LMP 07/01/1996   SpO2 100%   BMI 23.89 kg/m     General appearance: alert, cooperative and appears stated age Head: normocephalic, without obvious abnormality, atraumatic Neck: no adenopathy, supple, symmetrical, trachea midline and thyroid normal to inspection and palpation Lungs: clear to auscultation bilaterally Heart: regular rate and rhythm Abdomen: soft, non-tender; no masses, no organomegaly Extremities: extremities normal, atraumatic, no cyanosis or edema Inguinal nodes:  no  adenopathy.  Pelvic: External genitalia:  no lesions              No abnormal inguinal nodes palpated.              Urethra:  normal appearing urethra with no masses, tenderness or lesions              Bartholins and Skenes: normal                 Vagina: normal appearing vagina with normal color and discharge, erythema of the vaginal and atrophy noted.               Cervix: absent       Bimanual Exam:  Uterus: absent              Adnexa: no mass, fullness, tenderness    Chaperone was present for exam:  Raquel Sarna, RN  Assessment:     Vaginal discharge.  Vaginal pain. Status post TAH.  Ovaries remain. Hx stroke.  Plan:   Wet prep:  negative for yeast, clue cells, and trichomonas.  Negative whiff test.  Urinalysis and reflex culture.  We dicussed vaginal atrophy and tx options:  water based lubricants, cooking oils, and vit E.  Will avoid vaginal estrogens due to hx TIA.  Urine and reflex culture.  Fu for annual exam in 3 -4 months and prn.   After visit summary provided.   39 min  total time was spent for this patient encounter, including preparation, face-to-face counseling with the patient, coordination of care, and documentation of the encounter.

## 2022-06-25 LAB — URINALYSIS, COMPLETE W/RFL CULTURE
Bacteria, UA: NONE SEEN /HPF
Bilirubin Urine: NEGATIVE
Glucose, UA: NEGATIVE
Hgb urine dipstick: NEGATIVE
Hyaline Cast: NONE SEEN /LPF
Ketones, ur: NEGATIVE
Leukocyte Esterase: NEGATIVE
Nitrites, Initial: NEGATIVE
Protein, ur: NEGATIVE
RBC / HPF: NONE SEEN /HPF (ref 0–2)
Specific Gravity, Urine: 1.01 (ref 1.001–1.035)
WBC, UA: NONE SEEN /HPF (ref 0–5)
pH: 7.5 (ref 5.0–8.0)

## 2022-06-25 LAB — NO CULTURE INDICATED

## 2022-09-07 NOTE — Progress Notes (Signed)
GYNECOLOGY  VISIT   HPI: 62 y.o.   Married  Caucasian  female   G1P1001 with Patient's last menstrual period was 07/01/1996.   here for  3 month f/u for vaginal discharge.  She was originally scheduled for a routine annual exam and recheck appointment, but she states she was not clear about the purpose of the visit.   No vaginal pain anymore.   The vaginal discharge has decreased.   Not wearing a pad.   Not using vaginal products.   Not sexually active.    Declines flu vaccine.   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   GYNECOLOGIC HISTORY: Patient's last menstrual period was 07/01/1996. Contraception:  hysterectomy Menopausal hormone therapy:  n/a Last mammogram:  04/21/22 Breast Density Category C, BI-RADS CATEGORY 1 Negative Last pap smear:   hysterectomy        OB History     Gravida  1   Para  1   Term  1   Preterm      AB      Living  1      SAB      IAB      Ectopic      Multiple      Live Births  1              Patient Active Problem List   Diagnosis Date Noted   Sialoadenitis 06/07/2021   Acute bacterial sialadenitis 06/06/2021   History of COVID-19 12/15/2019   Cough 12/15/2019   Memory loss 12/15/2019   Brain fog 12/15/2019   Shortness of breath 12/15/2019   Coronavirus infection 07/04/2019   Presbycusis of both ears 10/20/2016   Sialolithiasis of submandibular gland 10/20/2016   Tinnitus aurium, 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   Hypothyroidism 04/20/2016   Overweight (BMI 25.0-29.9) 02/20/2016    Past Medical History:  Diagnosis Date   Allergy    Anxiety    COVID-19 virus infection 07/2019   hx of   Depression    Facial numbness    GERD (gastroesophageal reflux disease)    Mini stroke 12/2013   Salivary stone 05/2021   removed   Salivary stones    Thyroid disease     Past Surgical History:  Procedure Laterality Date   ABDOMINAL HYSTERECTOMY  11 / 1997   partial    [place] Right 06/10/2021    Procedure: EXCISION RIGHT SUBMANDIBULAR GLAND;  Surgeon: Izora Gala, MD;  Location: [facility];  Service: ENT;  Laterality: Right;   TOOTH EXTRACTION Left     Current Outpatient Medications  Medication Sig Dispense Refill   APPLE CIDER VINEGAR PO Take 2 tablets by mouth daily.     Calcium Carbonate-Vit D-Min (CALCIUM 1200 PO) Take 1 tablet by mouth daily.     ibuprofen (ADVIL) 200 MG tablet Take 200 mg by mouth every 6 (six) hours as needed for mild pain.     levothyroxine (SYNTHROID) 75 MCG tablet TAKE 1 TABLET ONCE DAILY BEFORE BREAKFAST (Patient taking differently: Take 75 mcg by mouth daily.) 90 tablet 2   Multiple Vitamins-Minerals (CENTRUM SILVER ADULT 50+ PO) Take 1 tablet by mouth daily.     traZODone (DESYREL) 100 MG tablet 1/2 to 1 tablet at bedtime Orally Once a day for 30 day(s)     valACYclovir (VALTREX) 1000 MG tablet TAKE ONE TABLET THREE TIMES DAILY FOR 7 DAYS     No current facility-administered medications for this visit.  ALLERGIES: Benadryl [diphenhydramine hcl (sleep)]  Family History  Problem Relation Age of Onset   Stroke Mother        passed at age 13   Arthritis Mother    COPD Mother    Depression Mother    Diabetes Mother    Hearing loss Mother    Heart disease Mother    Hypertension Mother    Dementia Mother    Cancer Father        bone passed at 75    Social History   Socioeconomic History   Marital status: Married    Spouse name: Eddie Dibbles   Number of children: Not on file   Years of education: Not on file   Highest education level: High school graduate  Occupational History   Not on file  Tobacco Use   Smoking status: Never   Smokeless tobacco: Never  Vaping Use   Vaping Use: Never used  Substance and Sexual Activity   Alcohol use: Yes    Comment: socially   Drug use: No   Sexual activity: Not Currently    Birth control/protection: Post-menopausal  Other Topics Concern   Not on file  Social History Narrative   Lives with husband    Caffeine- coffee 4 c daily   Social Determinants of Health   Financial Resource Strain: Not on file  Food Insecurity: Not on file  Transportation Needs: Not on file  Physical Activity: Not on file  Stress: Not on file  Social Connections: Not on file  Intimate Partner Violence: Not on file    Review of Systems  All other systems reviewed and are negative.   PHYSICAL EXAMINATION:    BP 116/82 (BP Location: Left Arm, Patient Position: Sitting, Cuff Size: Normal)   Pulse 77   Ht '5\' 6"'$  (1.676 m)   Wt 146 lb (66.2 kg)   LMP 07/01/1996   SpO2 98%   BMI 23.57 kg/m     General appearance: alert, cooperative and appears stated age  ASSESSMENT  Hx vaginitis.  Resolved.  Hx stroke.  Hx Covid.   PLAN  We discussed forms of vaginitis.  She agrees to return for a routine well woman visit.   We discussed the benefits of an annual well woman visit with gynecologic exam.    An After Visit Summary was printed and given to the patient.  10 min  total time was spent for this patient encounter, including preparation, face-to-face counseling with the patient, coordination of care, and documentation of the encounter.

## 2022-09-21 ENCOUNTER — Encounter: Payer: Self-pay | Admitting: Obstetrics and Gynecology

## 2022-09-21 ENCOUNTER — Ambulatory Visit (INDEPENDENT_AMBULATORY_CARE_PROVIDER_SITE_OTHER): Admitting: Obstetrics and Gynecology

## 2022-09-21 VITALS — BP 116/82 | HR 77 | Ht 66.0 in | Wt 146.0 lb

## 2022-09-21 DIAGNOSIS — Z719 Counseling, unspecified: Secondary | ICD-10-CM | POA: Diagnosis not present

## 2022-09-21 DIAGNOSIS — Z8742 Personal history of other diseases of the female genital tract: Secondary | ICD-10-CM | POA: Diagnosis not present

## 2022-09-29 NOTE — Progress Notes (Deleted)
62 y.o. G89P1001 Married Caucasian female here for annual exam.    PCP:     Patient's last menstrual period was 07/01/1996.           Sexually active: {yes no:314532}  The current method of family planning is status post hysterectomy.    Exercising: {yes no:314532}  {types:19826} Smoker:  no  Health Maintenance: Pap:  hx of hysterectomy in 1998, bleeding History of abnormal Pap:  no MMG:  04/21/22 Breast Density Category C, BI-RADS CATEGORY 1 Neg Colonoscopy:  03/24/21 BMD:  2018  Result  osteopenia TDaP:  02/20/13 Gardasil:   no HIV: unsure Hep C: unsure Screening Labs:  Hb today: ***, Urine today: ***   reports that she has never smoked. She has never used smokeless tobacco. She reports current alcohol use. She reports that she does not use drugs.  Past Medical History:  Diagnosis Date   Allergy    Anxiety    COVID-19 virus infection 07/2019   hx of   Depression    Facial numbness    GERD (gastroesophageal reflux disease)    Mini stroke 12/2013   Salivary stone 05/2021   removed   Salivary stones    Thyroid disease     Past Surgical History:  Procedure Laterality Date   ABDOMINAL HYSTERECTOMY  11 / 1997   partial    [place] Right 06/10/2021   Procedure: EXCISION RIGHT SUBMANDIBULAR GLAND;  Surgeon: Izora Gala, MD;  Location: [facility];  Service: ENT;  Laterality: Right;   TOOTH EXTRACTION Left     Current Outpatient Medications  Medication Sig Dispense Refill   APPLE CIDER VINEGAR PO Take 2 tablets by mouth daily.     Calcium Carbonate-Vit D-Min (CALCIUM 1200 PO) Take 1 tablet by mouth daily.     ibuprofen (ADVIL) 200 MG tablet Take 200 mg by mouth every 6 (six) hours as needed for mild pain.     levothyroxine (SYNTHROID) 75 MCG tablet TAKE 1 TABLET ONCE DAILY BEFORE BREAKFAST (Patient taking differently: Take 75 mcg by mouth daily.) 90 tablet 2   Multiple Vitamins-Minerals (CENTRUM SILVER ADULT 50+ PO) Take 1 tablet  by mouth daily.     traZODone (DESYREL) 100 MG tablet 1/2 to 1 tablet at bedtime Orally Once a day for 30 day(s)     valACYclovir (VALTREX) 1000 MG tablet TAKE ONE TABLET THREE TIMES DAILY FOR 7 DAYS     No current facility-administered medications for this visit.    Family History  Problem Relation Age of Onset   Stroke Mother        passed at age 83   Arthritis Mother    COPD Mother    Depression Mother    Diabetes Mother    Hearing loss Mother    Heart disease Mother    Hypertension Mother    Dementia Mother    Cancer Father        bone passed at 38    Review of Systems  Exam:   LMP 07/01/1996     General appearance: alert, cooperative and appears stated age Head: normocephalic, without obvious abnormality, atraumatic Neck: no adenopathy, supple, symmetrical, trachea midline and thyroid normal to inspection and palpation Lungs: clear to auscultation bilaterally Breasts: normal appearance, no masses or tenderness, No nipple retraction or dimpling, No nipple discharge or bleeding, No axillary adenopathy Heart: regular rate and rhythm Abdomen: soft, non-tender; no masses, no organomegaly Extremities: extremities normal, atraumatic, no cyanosis  or edema Skin: skin color, texture, turgor normal. No rashes or lesions Lymph nodes: cervical, supraclavicular, and axillary nodes normal. Neurologic: grossly normal  Pelvic: External genitalia:  no lesions              No abnormal inguinal nodes palpated.              Urethra:  normal appearing urethra with no masses, tenderness or lesions              Bartholins and Skenes: normal                 Vagina: normal appearing vagina with normal color and discharge, no lesions              Cervix: no lesions              Pap taken: {yes no:314532} Bimanual Exam:  Uterus:  normal size, contour, position, consistency, mobility, non-tender              Adnexa: no mass, fullness, tenderness              Rectal exam: {yes no:314532}.   Confirms.              Anus:  normal sphincter tone, no lesions  Chaperone was present for exam:  ***  Assessment:   Well woman visit with gynecologic exam.   Plan: Mammogram screening discussed. Self breast awareness reviewed. Pap and HR HPV as above. Guidelines for Calcium, Vitamin D, regular exercise program including cardiovascular and weight bearing exercise.   Follow up annually and prn.   Additional counseling given.  {yes [id number]. _______ minutes face to face time of which over 50% was spent in counseling.    After visit summary provided.

## 2022-10-13 ENCOUNTER — Ambulatory Visit: Admitting: Obstetrics and Gynecology

## 2022-11-19 NOTE — Progress Notes (Signed)
62 y.o. G7P1001 Married Caucasian female here for annual exam.    No GYN concerns today.   She saw dermatology this year.   No skin concerns.  PCP:   Annette Cai, NP  Patient's last menstrual period was 07/01/1996.           Sexually active: No.  The current method of family planning is status post hysterectomy.    Exercising: Yes.     Silver sneakers program Smoker:  no  Health Maintenance: Pap:  unsure, hyst in 1998 History of abnormal Pap:  no MMG:  04/21/22 Breast Density Category C, BI-RADS CATEGORY 1 neg Colonoscopy:  03/24/21 - Due in 2032 BMD:   2018  Result  osteopenia - spine TDaP:  02/20/13 -She will doe with PCP Gardasil:   no HIV: unsure Hep C: unsure Screening Labs:  PCP. Shingrix:  completed   reports that she has never smoked. She has never used smokeless tobacco. She reports current alcohol use. She reports that she does not use drugs.  Past Medical History:  Diagnosis Date   Allergy    Anxiety    COVID-19 virus infection 07/2019   hx of   Depression    Facial numbness    GERD (gastroesophageal reflux disease)    Mini stroke 12/2013   Salivary stone 05/2021   removed   Salivary stones    Shingles    Thyroid disease     Past Surgical History:  Procedure Laterality Date   ABDOMINAL HYSTERECTOMY  11 / 1997   partial    [place] Right 06/10/2021   Procedure: EXCISION RIGHT SUBMANDIBULAR GLAND;  Surgeon: Izora Gala, MD;  Location: [facility];  Service: ENT;  Laterality: Right;   TOOTH EXTRACTION Left     Current Outpatient Medications  Medication Sig Dispense Refill   APPLE CIDER VINEGAR PO Take 2 tablets by mouth daily.     Calcium Carbonate-Vit D-Min (CALCIUM 1200 PO) Take 1 tablet by mouth daily.     ibuprofen (ADVIL) 200 MG tablet Take 200 mg by mouth every 6 (six) hours as needed for mild pain.     levothyroxine (SYNTHROID) 75 MCG tablet TAKE 1 TABLET ONCE DAILY BEFORE BREAKFAST (Patient taking  differently: Take 75 mcg by mouth daily.) 90 tablet 2   Multiple Vitamins-Minerals (CENTRUM SILVER ADULT 50+ PO) Take 1 tablet by mouth daily.     traZODone (DESYREL) 100 MG tablet 1/2 to 1 tablet at bedtime Orally Once a day for 30 day(s) (Patient not taking: Reported on 12/03/2022)     No current facility-administered medications for this visit.    Family History  Problem Relation Age of Onset   Stroke Mother        passed at age 63   Arthritis Mother    COPD Mother    Depression Mother    Diabetes Mother    Hearing loss Mother    Heart disease Mother    Hypertension Mother    Dementia Mother    Cancer Father        bone passed at 3    Review of Systems  All other systems reviewed and are negative.   Exam:   BP 124/82 (BP Location: Left Arm, Patient Position: Sitting, Cuff Size: Normal)   Pulse 65   Ht 5\' 6"  (1.676 m)   Wt 146 lb (66.2 kg)   LMP 07/01/1996   SpO2 99%   BMI 23.57 kg/m  General appearance: alert, cooperative and appears stated age Head: normocephalic, without obvious abnormality, atraumatic Neck: no adenopathy, supple, symmetrical, trachea midline and thyroid normal to inspection and palpation Lungs: clear to auscultation bilaterally Breasts: normal appearance, no masses or tenderness, No nipple retraction or dimpling, No nipple discharge or bleeding, No axillary adenopathy Heart: regular rate and rhythm Abdomen: soft, non-tender; no masses, no organomegaly Extremities: extremities normal, atraumatic, no cyanosis or edema Skin: skin color, texture, turgor normal. No rashes or lesions Lymph nodes: cervical, supraclavicular, and axillary nodes normal. Neurologic: grossly normal  Pelvic: External genitalia:  no lesions              No abnormal inguinal nodes palpated.              Urethra:  normal appearing urethra with no masses, tenderness or lesions              Bartholins and Skenes: normal                 Vagina: erythema and green mucusy  discharge.              Cervix: absent              Pap taken: no Bimanual Exam:  Uterus:  absent              Adnexa: no mass, fullness, tenderness              Rectal exam: yes.  Confirms.              Anus:  normal sphincter tone, tiny external hemorrhoid.   Chaperone was present for exam:  Oneal Deputy, CMA  Assessment:   Well woman visit with gynecologic exam. Status post TAH.  Ovaries remain. Hx stroke Vaginal atrophy.  Osteopenia. Menopause.  Plan: Mammogram screening discussed. Self breast awareness reviewed. Pap and HR HPV as above. Guidelines for Calcium, Vitamin D, regular exercise program including cardiovascular and weight bearing exercise. We discussed vaginal atrophy and treatment with vaginal vitamin E.  She may try an OTC option from [employer].  We also discussed prescription vaginal vit E, 200 IU twice weekly if she desires prescription strength.  BMD at the [facility]. Follow up annually and prn.   After visit summary provided.

## 2022-12-03 ENCOUNTER — Encounter: Payer: Self-pay | Admitting: Obstetrics and Gynecology

## 2022-12-03 ENCOUNTER — Ambulatory Visit (INDEPENDENT_AMBULATORY_CARE_PROVIDER_SITE_OTHER): Admitting: Obstetrics and Gynecology

## 2022-12-03 VITALS — BP 124/82 | HR 65 | Ht 66.0 in | Wt 146.0 lb

## 2022-12-03 DIAGNOSIS — N952 Postmenopausal atrophic vaginitis: Secondary | ICD-10-CM | POA: Diagnosis not present

## 2022-12-03 DIAGNOSIS — Z780 Asymptomatic menopausal state: Secondary | ICD-10-CM

## 2022-12-03 DIAGNOSIS — M8580 Other specified disorders of bone density and structure, unspecified site: Secondary | ICD-10-CM | POA: Diagnosis not present

## 2022-12-03 DIAGNOSIS — Z01419 Encounter for gynecological examination (general) (routine) without abnormal findings: Secondary | ICD-10-CM | POA: Diagnosis not present

## 2022-12-03 DIAGNOSIS — M21619 Bunion of unspecified foot: Secondary | ICD-10-CM | POA: Insufficient documentation

## 2022-12-03 NOTE — Patient Instructions (Addendum)

## 2023-05-11 ENCOUNTER — Other Ambulatory Visit: Payer: Self-pay | Admitting: Obstetrics and Gynecology

## 2023-05-11 ENCOUNTER — Other Ambulatory Visit: Payer: Self-pay | Admitting: Internal Medicine

## 2023-05-11 DIAGNOSIS — Z1231 Encounter for screening mammogram for malignant neoplasm of breast: Secondary | ICD-10-CM

## 2023-05-12 ENCOUNTER — Ambulatory Visit
Admission: RE | Admit: 2023-05-12 | Discharge: 2023-05-12 | Disposition: A | Discharge status: Home / Self Care | Source: Ambulatory Visit | Attending: Internal Medicine | Admitting: Internal Medicine

## 2023-05-12 DIAGNOSIS — Z1231 Encounter for screening mammogram for malignant neoplasm of breast: Secondary | ICD-10-CM

## 2023-06-21 ENCOUNTER — Ambulatory Visit
Admission: RE | Admit: 2023-06-21 | Discharge: 2023-06-21 | Disposition: A | Discharge status: Home / Self Care | Source: Ambulatory Visit | Attending: Obstetrics and Gynecology | Admitting: Obstetrics and Gynecology

## 2023-06-21 DIAGNOSIS — M8580 Other specified disorders of bone density and structure, unspecified site: Secondary | ICD-10-CM

## 2023-06-21 DIAGNOSIS — Z780 Asymptomatic menopausal state: Secondary | ICD-10-CM

## 2023-07-11 ENCOUNTER — Other Ambulatory Visit: Payer: Self-pay

## 2023-07-11 ENCOUNTER — Emergency Department (HOSPITAL_COMMUNITY)

## 2023-07-11 ENCOUNTER — Emergency Department (HOSPITAL_COMMUNITY)
Admission: EM | Admit: 2023-07-11 | Discharge: 2023-07-11 | Disposition: A | Discharge status: Home / Self Care | Attending: Emergency Medicine | Admitting: Emergency Medicine

## 2023-07-11 ENCOUNTER — Encounter (HOSPITAL_COMMUNITY): Payer: Self-pay | Admitting: Radiology

## 2023-07-11 DIAGNOSIS — E039 Hypothyroidism, unspecified: Secondary | ICD-10-CM | POA: Diagnosis not present

## 2023-07-11 DIAGNOSIS — R55 Syncope and collapse: Secondary | ICD-10-CM | POA: Insufficient documentation

## 2023-07-11 LAB — BASIC METABOLIC PANEL
Anion gap: 8 (ref 5–15)
BUN: 15 mg/dL (ref 8–23)
CO2: 27 mmol/L (ref 22–32)
Calcium: 9.1 mg/dL (ref 8.9–10.3)
Chloride: 103 mmol/L (ref 98–111)
Creatinine, Ser: 1 mg/dL (ref 0.44–1.00)
GFR, Estimated: >60 mL/min (ref >60)
Glucose, Bld: 107 mg/dL — ABNORMAL HIGH (ref 70–99)
Potassium: 3.8 mmol/L (ref 3.5–5.1)
Sodium: 138 mmol/L (ref 135–145)

## 2023-07-11 LAB — URINALYSIS, ROUTINE W REFLEX MICROSCOPIC
Bilirubin Urine: NEGATIVE
Glucose, UA: NEGATIVE mg/dL
Hgb urine dipstick: NEGATIVE
Ketones, ur: NEGATIVE mg/dL
Leukocytes,Ua: NEGATIVE
Nitrite: NEGATIVE
Protein, ur: NEGATIVE mg/dL
Specific Gravity, Urine: 1.014 (ref 1.005–1.030)
pH: 7 (ref 5.0–8.0)

## 2023-07-11 LAB — CBC
HCT: 38.9 % (ref 36.0–46.0)
Hemoglobin: 13.1 g/dL (ref 12.0–15.0)
MCH: 29.3 pg (ref 26.0–34.0)
MCHC: 33.7 g/dL (ref 30.0–36.0)
MCV: 87 fL (ref 80.0–100.0)
Platelets: 248 10*3/uL (ref 150–400)
RBC: 4.47 MIL/uL (ref 3.87–5.11)
RDW: 13.2 % (ref 11.5–15.5)
WBC: 4 10*3/uL (ref 4.0–10.5)
nRBC: 0 % (ref 0.0–0.2)

## 2023-07-11 LAB — CBG MONITORING, ED: Glucose-Capillary: 101 mg/dL — ABNORMAL HIGH (ref 70–99)

## 2023-07-11 MED ORDER — IOHEXOL 350 MG/ML SOLN
75.0000 mL | Freq: Once | INTRAVENOUS | Status: AC | PRN
  Administered 2023-07-11: 75 mL via INTRAVENOUS

## 2023-07-11 MED ORDER — ONDANSETRON HCL 4 MG/2ML IJ SOLN: 4.0000 mg | Freq: Once | INTRAMUSCULAR | Status: DC

## 2023-07-11 MED ORDER — SODIUM CHLORIDE 0.9 % IV BOLUS
1000.0000 mL | Freq: Once | INTRAVENOUS | Status: AC
  Administered 2023-07-11: 1000 mL via INTRAVENOUS

## 2023-07-11 NOTE — Discharge Instructions (Signed)
Evaluation today for your passing out event at church was overall reassuring.  CTA of your head and neck were negative for stroke.  Advised to follow-up PCP.  If you have similar symptoms again please return for further evaluation.  Otherwise recommend assertive hydration at home.

## 2023-07-11 NOTE — ED Notes (Signed)
Pt ambulated with her only complaint being weakness.

## 2023-07-11 NOTE — ED Provider Notes (Signed)
[facility] EMERGENCY DEPARTMENT AT [facility] Provider Note   CSN: [id number] Arrival date & time: 07/11/23  1219     History  Chief Complaint  Patient presents with   Loss of Consciousness   HPI Annette Silva is a 62 y.o. female with history of hypothyroidism, GERD presenting for syncopal episode at church today.  Per husband this started around 1140 am.  Husband stated that she felt kind of dizzy and weak.  She sat down in the church pew and then he witnessed her kind of slumped over onto the pew bench.  Husband states it almost look "like a seizure" but she was not necessarily convulsing.  EMS was called when they arrived she was more lucid.  At that time she complained of both legs being weak.  Now she reports that she feels the left side of her body feels weak.   Loss of Consciousness      Home Medications Prior to Admission medications   Medication Sig Start Date End Date Taking? Authorizing Provider  acetaminophen (TYLENOL) 500 MG tablet Take 1,000 mg by mouth daily as needed for moderate pain (pain score 4-6) or headache.   Yes [provider]  Calcium Carb-Cholecalciferol (CALCIUM + VITAMIN D3 PO) Take 1 tablet by mouth daily.   Yes [provider]  cariprazine (VRAYLAR) 3 MG capsule Take 3 mg by mouth at bedtime.   Yes [provider]  ibuprofen (ADVIL) 800 MG tablet Take 800 mg by mouth daily as needed for headache or moderate pain (pain score 4-6).   Yes [provider]  levothyroxine (SYNTHROID) 75 MCG tablet TAKE 1 TABLET ONCE DAILY BEFORE BREAKFAST 11/14/20  Yes Stacks, Broadus Humna Moorehouse, MD  Multiple Vitamins-Minerals (CENTRUM SILVER WOMEN 50+) TABS Take 1 tablet by mouth daily.   Yes [provider]      Allergies    Benadryl [diphenhydramine hcl (sleep)]    Review of Systems   Review of Systems  Cardiovascular:  Positive for syncope.    Physical Exam   Vitals:   07/11/23 1235 07/11/23 1515  BP: (!) 147/81  134/82  Pulse: 67 66  Resp: 12 17  Temp: 97.8 F (36.6 C)   SpO2: 97% 97%    CONSTITUTIONAL:  well-appearing, NAD NEURO:  GCS 15. Speech is goal oriented. No deficits appreciated to CN III-XII; symmetric eyebrow raise, no facial drooping, tongue midline.  Bilateral 4/5 strength in the upper extremities.  Patient attempted to move her lower legs but states that she could not. Sensation to light touch intact. Patient moves extremities without ataxia.Finger-nose-finger was a bit sluggish and discoordinated. Patient refused gait assessment stating she was unsure if she could walk. EYES:  eyes equal and reactive ENT/NECK:  Supple, no stridor  CARDIO:  regular rate and rhythm, appears well-perfused  PULM:  No respiratory distress, CTAB GI/GU:  non-distended, soft MSK/SPINE:  No gross deformities, no edema, moves all extremities  SKIN:  no rash, atraumatic  *Additional and/or pertinent findings included in MDM below   ED Results / Procedures / Treatments   Labs (all labs ordered are listed, but only abnormal results are displayed) Labs Reviewed  BASIC METABOLIC PANEL - Abnormal; Notable for the following components:      Result Value   Glucose, Bld 107 (*)    All other components within normal limits  URINALYSIS, ROUTINE W REFLEX MICROSCOPIC - Abnormal; Notable for the following components:   Color, Urine STRAW (*)    All other components  within normal limits  CBG MONITORING, ED - Abnormal; Notable for the following components:   Glucose-Capillary 101 (*)    All other components within normal limits  CBC    EKG EKG Interpretation Date/Time:  Sunday July 11 2023 12:31:08 EST Ventricular Rate:  73 PR Interval:  137 QRS Duration:  102 QT Interval:  402 QTC Calculation: 443 R Axis:   47  Text Interpretation: Sinus rhythm nonspecific ST changes No old tracing to compare Confirmed by Pricilla Loveless [phone]) on 07/11/2023 12:58:35 PM  Radiology CT ANGIO HEAD NECK W WO  CM  Result Date: 07/11/2023 CLINICAL DATA:  Neuro deficit, acute, stroke suspected EXAM: CT ANGIOGRAPHY HEAD AND NECK WITH AND WITHOUT CONTRAST TECHNIQUE: Multidetector CT imaging of the head and neck was performed using the standard protocol during bolus administration of intravenous contrast. Multiplanar CT image reconstructions and MIPs were obtained to evaluate the vascular anatomy. Carotid stenosis measurements (when applicable) are obtained utilizing NASCET criteria, using the distal internal carotid diameter as the denominator. RADIATION DOSE REDUCTION: This exam was performed according to the departmental dose-optimization program which includes automated exposure control, adjustment of the mA and/or kV according to patient size and/or use of iterative reconstruction technique. CONTRAST:  75mL OMNIPAQUE IOHEXOL 350 MG/ML SOLN COMPARISON:  CT Neck 06/06/21 FINDINGS: CT HEAD FINDINGS Brain: No evidence of acute infarction, hemorrhage, hydrocephalus, extra-axial collection or mass lesion/mass effect. Vascular: No hyperdense vessel or unexpected calcification. Skull: Normal. Negative for fracture or focal lesion. Sinuses/Orbits: No middle ear or mastoid effusion. Paranasal sinuses are clear. Orbits are unremarkable. Other: None. Review of the MIP images confirms the above findings CTA NECK FINDINGS Aortic arch: Two-vessel arch. Imaged portion shows no evidence of aneurysm or dissection. No significant stenosis of the major arch vessel origins. Right carotid system: No evidence of dissection, stenosis (50% or greater), or occlusion. Left carotid system: No evidence of dissection, stenosis (50% or greater), or occlusion. Vertebral arteries: Codominant. No evidence of dissection, stenosis (50% or greater), or occlusion. Skeleton: Negative. Other neck: Likely postsurgical changes from a right submandibulectomy Upper chest: Mild esophageal wall thickening is nonspecific, but can be seen in the setting of  esophagitis. Correlate with symptomatology. Review of the MIP images confirms the above findings CTA HEAD FINDINGS Anterior circulation: No significant stenosis, proximal occlusion, aneurysm, or vascular malformation. Posterior circulation: No significant stenosis, proximal occlusion, aneurysm, or vascular malformation. The basilar artery is diffusely small in caliber. Venous sinuses: As permitted by contrast timing, patent. Anatomic variants: Fetal PCAs bilaterally. Review of the MIP images confirms the above findings IMPRESSION: 1. No acute intracranial abnormality. 2. No intracranial large vessel occlusion or significant stenosis. 3. No hemodynamically significant stenosis in the neck. 4. Mild esophageal wall thickening is nonspecific, but can be seen in the setting of esophagitis. Correlate with symptomatology. Electronically Signed   By: Lorenza Cambridge M.D.   On: 07/11/2023 16:11    Procedures Procedures    Medications Ordered in ED Medications  sodium chloride 0.9 % bolus 1,000 mL (0 mLs Intravenous Stopped 07/11/23 1702)  iohexol (OMNIPAQUE) 350 MG/ML injection 75 mL (75 mLs Intravenous Contrast Given 07/11/23 1403)    ED Course/ Medical Decision Making/ A&P Clinical Course as of 07/11/23 1738  Sun Jul 11, 2023  1347 Discussed with Dr. Amada Jupiter.  Patient has bilateral leg weakness after syncope which sounded like she was getting progressively more weak after standing up while singing at church.  Now she cannot reportedly move either leg.  She can weakly  wiggle both toes.  When I try to range her knee it seems like there is some contraction to keep it more straight.  She has intact reflexes patellar and Achilles as well as downgoing Babinski's.  I discussed with neuro, for now will not call code stroke but get an urgent CTA to rule out a basilar artery occlusion.  He would not give TNK for this so will not call code stroke.  Will also give some fluids and reassess. [SG]    Clinical Course  User Index [SG] Pricilla Loveless, MD                                 Medical Decision Making Amount and/or Complexity of Data Reviewed Labs: ordered. Radiology: ordered.  Risk Prescription drug management.   Initial Impression and Ddx 62 year old well-appearing female presenting for syncopal episode.  Exam notable for lack of movement in the lower extremities patient symmetric weakness in the upper extremities.  DDx includes arrhythmia genic or neurogenic syncope, stroke, electrolyte derangement, vasovagal syncope, seizure, other. Patient PMH that increases complexity of ED encounter:  history of hypothyroidism  Interpretation of Diagnostics - I independent reviewed and interpreted the labs as followed: none  - I independently visualized the following imaging with scope of interpretation limited to determining acute life threatening conditions related to emergency care: CTA chest, which revealed no acute findings in the head or neck aside from some mild esophageal wall thickening  -I personally reviewed and interpreted EKG which revealed sinus rhythm with nonspecific ST changes  Patient Reassessment and Ultimate Disposition/Management On reassessment, patient was able to move her legs and stated that she had no symptoms.    CTA of the head neck was negative for stroke findings. Able to ambulate patient down the hall without complication.  Overall remained well, no acute distress, hemodynamically stable and nontoxic.  Advised her to follow-up with PCP.  Discussed strict return precautions.  Suspect this could be vasovagal syncope.  Vital stable.  Discharged in good condition.  Patient management required discussion with the following services or consulting groups:  Neurology  Complexity of Problems Addressed Acute complicated illness or Injury  Additional Data Reviewed and Analyzed Further history obtained from: Further history from spouse/family member, Past medical history and  medications listed in the EMR, and Prior ED visit notes  Patient Encounter Risk Assessment None         Final Clinical Impression(s) / ED Diagnoses Final diagnoses:  Syncope, unspecified syncope type    Rx / DC Orders ED Discharge Orders     None         Gareth Eagle, PA-C 07/11/23 1738    Pricilla Loveless, MD 07/14/23 1511

## 2023-07-11 NOTE — ED Notes (Signed)
Pt able to stand with assistance to use bedside commode

## 2023-07-11 NOTE — ED Triage Notes (Signed)
Pt was at church and had a witness syncopal episode. On ems arrival pt was questioning what happened. Pt at this time is able to tell this nurse what she was doing leading up to the episode. Pt complaining of weakness in her bil legs.

## 2023-08-02 ENCOUNTER — Encounter: Payer: Self-pay | Admitting: Diagnostic Neuroimaging

## 2023-08-02 ENCOUNTER — Ambulatory Visit: Admitting: Diagnostic Neuroimaging

## 2023-08-02 VITALS — BP 132/79 | HR 70 | Ht 66.0 in | Wt 149.9 lb

## 2023-08-02 DIAGNOSIS — R55 Syncope and collapse: Secondary | ICD-10-CM

## 2023-08-02 NOTE — Progress Notes (Signed)
[facility]  PATIENT: Annette Silva DOB: [birth year]  REFERRING CLINICIAN: Rebekah Chesterfield, NP HISTORY FROM: patient  REASON FOR VISIT: new consult    HISTORICAL  CHIEF COMPLAINT:  Chief Complaint  Patient presents with   Follow-up    Patient in room #7 with her sister. Patient states three weeks ago she passed out in church and was sent to the ER. Patient states when she woke up in the ER, she could feel anything in her arms and legs for the first four hours. Patient state she still has numbness in her leg. Patient states she can't stand or sit for a long period of time. Patient states her PCP say her balance is off.    HISTORY OF PRESENT ILLNESS:   UPDATE (08/02/23, VRP): Since last visit, doing a little better, until 07/11/23. Had dizzy, hot feeling, then LOC at church while sitting down. Continues with stress issues at home. Started vraylar in Oct 2024. Sleep is better.    UPDATE (12/16/21, VRP): Since last visit, doing well, except had right salivary gland stone and gland removal in Oct 2022. Some post-op numbness in neck and right facial weakness, that has improved. Now having some facial numbness, right to left, in last few weeks.   PRIOR HPI (01/22/20): 63 year old female here for evaluation of post Covid brain fog and memory loss.  Patient had Covid in November 2020.  She had cough shortness of breath fatigue headaches loss of smell and taste, acid reflux and numbness tingling.  Patient had first Covid vaccine in April 2021 and second vaccine last Friday.  Since then symptoms have been stable.  She continues to have insomnia, feeling cold, fatigue, brain fog, loss of smell and taste.  She sleeps about 5 to 6 hours at night.   REVIEW OF SYSTEMS: Full 14 system review of systems performed and negative with exception of: As per HPI.  ALLERGIES: Allergies  Allergen Reactions   Benadryl [Diphenhydramine Hcl (Sleep)] Other (See Comments)     Hyperactivity     HOME MEDICATIONS: Outpatient Medications Prior to Visit  Medication Sig Dispense Refill   acetaminophen (TYLENOL) 500 MG tablet Take 1,000 mg by mouth daily as needed for moderate pain (pain score 4-6) or headache.     Calcium Carb-Cholecalciferol (CALCIUM + VITAMIN D3 PO) Take 1 tablet by mouth daily.     cariprazine (VRAYLAR) 3 MG capsule Take 3 mg by mouth at bedtime.     ibuprofen (ADVIL) 800 MG tablet Take 800 mg by mouth daily as needed for headache or moderate pain (pain score 4-6).     levothyroxine (SYNTHROID) 75 MCG tablet TAKE 1 TABLET ONCE DAILY BEFORE BREAKFAST 90 tablet 2   Multiple Vitamins-Minerals (CENTRUM SILVER WOMEN 50+) TABS Take 1 tablet by mouth daily.     vitamin B-12 (CYANOCOBALAMIN) 100 MCG tablet Take 100 mcg by mouth daily.     No facility-administered medications prior to visit.    PAST MEDICAL HISTORY: Past Medical History:  Diagnosis Date   Allergy    Anxiety    COVID-19 virus infection 07/2019   hx of   Depression    Facial numbness    GERD (gastroesophageal reflux disease)    Mini stroke 12/2013   Salivary stone 05/2021   removed   Salivary stones    Shingles    Thyroid disease     PAST SURGICAL HISTORY: Past Surgical History:  Procedure Laterality Date   ABDOMINAL HYSTERECTOMY  11 / 1997  partial    CESAREAN SECTION  1993   SUBMANDIBULAR GLAND EXCISION Right 06/10/2021   Procedure: EXCISION RIGHT SUBMANDIBULAR GLAND;  Surgeon: Serena Colonel, MD;  Location: [facility] OR;  Service: ENT;  Laterality: Right;   TOOTH EXTRACTION Left     FAMILY HISTORY: Family History  Problem Relation Age of Onset   Stroke Mother        passed at age 16   Arthritis Mother    COPD Mother    Depression Mother    Diabetes Mother    Hearing loss Mother    Heart disease Mother    Hypertension Mother    Dementia Mother    Cancer Father        bone passed at 33    SOCIAL HISTORY: Social History   Socioeconomic History   Marital  status: Married    Spouse name: Renae Fickle   Number of children: Not on file   Years of education: Not on file   Highest education level: High school graduate  Occupational History   Not on file  Tobacco Use   Smoking status: Never   Smokeless tobacco: Never  Vaping Use   Vaping status: Never Used  Substance and Sexual Activity   Alcohol use: Yes    Comment: socially   Drug use: No   Sexual activity: Not Currently    Birth control/protection: Post-menopausal  Other Topics Concern   Not on file  Social History Narrative   Lives with husband   Caffeine- coffee 4 c daily   Social Determinants of Health   Financial Resource Strain: Not on file  Food Insecurity: Not on file  Transportation Needs: Not on file  Physical Activity: Not on file  Stress: Not on file  Social Connections: Unknown (01/12/2022)   Received from [facility], [facility]   Social Network    Social Network: Not on file  Intimate Partner Violence: Unknown (12/04/2021)   Received from [employer], [facility]   HITS    Physically Hurt: Not on file    Insult or Talk Down To: Not on file    Threaten Physical Harm: Not on file    Scream or Curse: Not on file     PHYSICAL EXAM  GENERAL EXAM/CONSTITUTIONAL: Vitals:  Vitals:   08/02/23 1024  BP: 132/79  Pulse: 70  Weight: 149 lb 14.4 oz (68 kg)  Height: 5\' 6"  (1.676 m)   Body mass index is 24.19 kg/m. Wt Readings from Last 3 Encounters:  08/02/23 149 lb 14.4 oz (68 kg)  07/11/23 145 lb (65.8 kg)  12/03/22 146 lb (66.2 kg)   Patient is in no distress; well developed, nourished and groomed; neck is supple  CARDIOVASCULAR: Examination of carotid arteries is normal; no carotid bruits Regular rate and rhythm, no murmurs Examination of peripheral vascular system by observation and palpation is normal  EYES: Ophthalmoscopic exam of optic discs and posterior segments is normal; no papilledema or hemorrhages No results  found.  MUSCULOSKELETAL: Gait, strength, tone, movements noted in Neurologic exam below  NEUROLOGIC: MENTAL STATUS:     01/22/2020    8:18 AM  MMSE - Mini Mental State Exam  Orientation to time 5  Orientation to Place 4  Registration 3  Attention/ Calculation 3  Recall 2  Language- name 2 objects 2  Language- repeat 1  Language- follow 3 step command 3  Language- read & follow direction 1  Write a sentence 1  Copy design 1  Total score 26  awake, alert, oriented to person, place and time recent and remote memory intact normal attention and concentration language fluent, comprehension intact, naming intact fund of knowledge appropriate  CRANIAL NERVE:  2nd - no papilledema on fundoscopic exam 2nd, 3rd, 4th, 6th - pupils equal and reactive to light, visual fields full to confrontation, extraocular muscles intact, no nystagmus 5th - facial sensation symmetric 7th - facial strength symmetric 8th - hearing intact 9th - palate elevates symmetrically, uvula midline 11th - shoulder shrug symmetric 12th - tongue protrusion midline  MOTOR:  normal bulk and tone, full strength in the BUE, BLE  SENSORY:  normal and symmetric to light touch, temperature, vibration  COORDINATION:  finger-nose-finger, fine finger movements normal  REFLEXES:  deep tendon reflexes TRACE present and symmetric  GAIT/STATION:  narrow based gait     DIAGNOSTIC DATA (LABS, IMAGING, TESTING) - I reviewed patient records, labs, notes, testing and imaging myself where available.  Lab Results  Component Value Date   WBC 4.0 07/11/2023   HGB 13.1 07/11/2023   HCT 38.9 07/11/2023   MCV 87.0 07/11/2023   PLT 248 07/11/2023      Component Value Date/Time   NA 138 07/11/2023 1300   NA 148 (H) 12/16/2021 1540   K 3.8 07/11/2023 1300   CL 103 07/11/2023 1300   CO2 27 07/11/2023 1300   GLUCOSE 107 (H) 07/11/2023 1300   BUN 15 07/11/2023 1300   BUN 13 12/16/2021 1540   CREATININE 1.00  07/11/2023 1300   CALCIUM 9.1 07/11/2023 1300   PROT 6.8 12/16/2021 1540   ALBUMIN 4.7 12/16/2021 1540   AST 22 12/16/2021 1540   ALT 14 12/16/2021 1540   ALKPHOS 95 12/16/2021 1540   BILITOT 0.4 12/16/2021 1540   GFRNONAA >60 07/11/2023 1300   GFRAA 76 09/10/2020 1609   Lab Results  Component Value Date   CHOL 210 (H) 09/10/2020   HDL 75 09/10/2020   LDLCALC 119 (H) 09/10/2020   TRIG 88 09/10/2020   CHOLHDL 2.8 09/10/2020   Lab Results  Component Value Date   HGBA1C 5.3 12/16/2021   Lab Results  Component Value Date   VITAMINB12 642 12/16/2021   Lab Results  Component Value Date   TSH 2.080 12/16/2021      ASSESSMENT AND PLAN  62 y.o. year old female here with post Covid symptoms of brain fog, loss of smell and taste, headaches, insomnia, feeling cold since November 2020.  Dx:  1. Syncope, unspecified syncope type     PLAN:  SYNCOPE on 07/11/23 (in seated position; unclear if seizure or other spell)  - check MRI, EEG  - cardiology consult for syncope evaluation  - According to  law, you can not drive unless you are seizure / syncope free for at least 6 months and under physician's care.   - Please maintain precautions. Do not participate in activities where a loss of awareness could harm you or someone else. No swimming alone, no tub bathing, no hot tubs, no driving, no operating motorized vehicles (cars, ATVs, motocycles, etc), lawnmowers, power tools or firearms. No standing at heights, such as rooftops, ladders or stairs. Avoid hot objects such as stoves, heaters, open fires. Wear a helmet when riding a bicycle, scooter, skateboard, etc. and avoid areas of traffic. Set your water heater to 120 degrees or less.    FACIAL NUMBNESS - unclear etiology; normal neuro exam; some mild anxiety issues   POST-COVID SYNDROME (fatigue, brain fog, numbness, loss of smell / taste) -  supportive care; brain and physical activities reviewed; sleep and nutrition  reviewed   Orders Placed This Encounter  Procedures   MR BRAIN W WO CONTRAST   EEG adult   Return in about 6 months (around 01/31/2024) for pending if symptoms worsen or fail to improve, pending test results.  I reviewed images, labs, notes, records myself. I summarized findings and reviewed with patient, for this high risk condition (syncope vs seizure) requiring high complexity decision making.    Suanne Marker, MD 08/02/2023, 11:08 AM Certified in Neurology, Neurophysiology and Neuroimaging  [facility] [address], [address] [place], [place] [address] [phone]

## 2023-08-02 NOTE — Patient Instructions (Signed)
  SYNCOPE on 07/11/23 (in seated position; unclear if seizure or other spell)  - check MRI, EEG  - cardiology consult for syncope evaluation  - According to [place] law, you can not drive unless you are seizure / syncope free for at least 6 months and under physician's care.   - Please maintain precautions. Do not participate in activities where a loss of awareness could harm you or someone else. No swimming alone, no tub bathing, no hot tubs, no driving, no operating motorized vehicles (cars, ATVs, motocycles, etc), lawnmowers, power tools or firearms. No standing at heights, such as rooftops, ladders or stairs. Avoid hot objects such as stoves, heaters, open fires. Wear a helmet when riding a bicycle, scooter, skateboard, etc. and avoid areas of traffic. Set your water heater to 120 degrees or less.

## 2023-08-26 ENCOUNTER — Ambulatory Visit
Admission: RE | Admit: 2023-08-26 | Discharge: 2023-08-26 | Disposition: A | Discharge status: Home / Self Care | Source: Ambulatory Visit | Attending: Diagnostic Neuroimaging | Admitting: Diagnostic Neuroimaging

## 2023-08-26 DIAGNOSIS — R55 Syncope and collapse: Secondary | ICD-10-CM | POA: Diagnosis not present

## 2023-08-26 MED ORDER — GADOPICLENOL 0.5 MMOL/ML IV SOLN
7.5000 mL | Freq: Once | INTRAVENOUS | Status: AC | PRN
  Administered 2023-08-26: 7.5 mL via INTRAVENOUS

## 2023-09-06 ENCOUNTER — Institutional Professional Consult (permissible substitution): Admitting: Diagnostic Neuroimaging

## 2023-09-08 ENCOUNTER — Ambulatory Visit (INDEPENDENT_AMBULATORY_CARE_PROVIDER_SITE_OTHER): Admitting: Diagnostic Neuroimaging

## 2023-09-08 DIAGNOSIS — R55 Syncope and collapse: Secondary | ICD-10-CM

## 2023-09-21 NOTE — Procedures (Signed)
   [facility]  EEG (ELECTROENCEPHALOGRAM) REPORT   STUDY DATE: 09/08/23 PATIENT NAME: Noomi Scala DOB: 10/03/1960 MRN: [id number]  ORDERING CLINICIAN: Joycelyn Schmid, MD   TECHNOLOGIST: Marcheta Grammes TECHNIQUE: Electroencephalogram was recorded utilizing standard 10-20 system of lead placement and reformatted into average and bipolar montages.  RECORDING TIME: 28 minutes ACTIVATION: hyperventilation and photic stimulation  CLINICAL INFORMATION: 63 year old female with syncope and confusion.  FINDINGS: Posterior dominant background rhythms, which attenuate with eye opening, ranging 9-10 hertz and 30-40 microvolts. No focal, lateralizing, epileptiform activity or seizures are seen. Patient recorded in the awake and drowsy state. EKG channel shows regular rhythm of 75-80 beats per minute.   IMPRESSION:   Normal EEG in awake and drowsy states.    INTERPRETING PHYSICIAN:  Suanne Marker, MD Certified in Neurology, Neurophysiology and Neuroimaging  [facility] [address], [address] [place], [place] [address] [phone]

## 2023-09-22 NOTE — Progress Notes (Signed)
Unremarkable imaging results. Continue current plan. -VRP

## 2023-09-24 NOTE — Progress Notes (Signed)
Results are good, no major findings. Continue current plan. -VRP

## 2023-10-05 ENCOUNTER — Ambulatory Visit: Admitting: Cardiology

## 2023-10-19 ENCOUNTER — Institutional Professional Consult (permissible substitution): Admitting: Diagnostic Neuroimaging

## 2023-11-29 ENCOUNTER — Ambulatory Visit: Admitting: Internal Medicine

## 2024-02-07 ENCOUNTER — Encounter: Payer: Self-pay | Admitting: Cardiovascular Disease

## 2024-02-07 ENCOUNTER — Ambulatory Visit: Payer: Self-pay | Attending: Cardiovascular Disease | Admitting: Cardiovascular Disease

## 2024-02-07 VITALS — BP 134/82 | HR 62 | Ht 66.0 in | Wt 155.2 lb

## 2024-02-07 DIAGNOSIS — Z136 Encounter for screening for cardiovascular disorders: Secondary | ICD-10-CM

## 2024-02-07 DIAGNOSIS — R55 Syncope and collapse: Secondary | ICD-10-CM | POA: Diagnosis not present

## 2024-02-07 NOTE — Assessment & Plan Note (Signed)
 Annette Silva was referred to me by her primary care provider Mindi Alto , NP for evaluation of syncope.  She has no cardiovascular risk factors.  The episode occurred in early November of last year while she was in church sitting in her pew.  This was witnessed.  She was taken by EMS to the emergency room where her diagnosis was dehydration.  Workup was unrevealing.  She has had no further symptoms.  Given the fact that she has been asymptomatic for 8 months I do not feel inclined to perform 2D echocardiography or event monitoring at this time.  Should she have a recurrent episode we will pursue a more thorough workup.

## 2024-02-07 NOTE — Progress Notes (Signed)
 02/07/2024 Annette Silva   1961/05/05  [id number]  Primary Physician Lenn Quint, NP Primary Cardiologist: Avanell Leigh MD Annette Silva, Annette Silva  HPI:  Annette Silva is a 63 y.o. thin and fit appearing Annette Silva mother of 1 child, grandmother of 8 grandchildren who still works as a [employer] operational officer.  She was referred by her primary care provider, Mindi Alto, NP, for evaluation of syncope.  She has no cardiovascular risk factors.  Is a question of TIA back in 2001.  Has never had a heart attack.  There is no family history.  She denies chest pain or shortness of breath.  She does workout Silver sneakers.  She currently had COVID and has a diagnosis of "long COVID" manifested as fatigue and "brain fog".  She has been evaluated by neurology, Dr. Salli Crawley who performed EEG testing that was unrevealing.  She has had no recurrent symptoms.   Current Meds  Medication Sig   acetaminophen  (TYLENOL ) 500 MG tablet Take 1,000 mg by mouth daily as needed for moderate pain (pain score 4-6) or headache.   Calcium Carb-Cholecalciferol (CALCIUM + VITAMIN D3 PO) Take 1 tablet by mouth daily.   ibuprofen (ADVIL) 800 MG tablet Take 800 mg by mouth daily as needed for headache or moderate pain (pain score 4-6).   levothyroxine  (SYNTHROID ) 75 MCG tablet TAKE 1 TABLET ONCE DAILY BEFORE BREAKFAST   Multiple Vitamins-Minerals (CENTRUM SILVER WOMEN 50+) TABS Take 1 tablet by mouth daily.   vitamin B-12 (CYANOCOBALAMIN) 100 MCG tablet Take 100 mcg by mouth daily.     Allergies  Allergen Reactions   Benadryl [Diphenhydramine Hcl (Sleep)] Other (See Comments)    Hyperactivity     Social History   Socioeconomic History   Marital status: Married    Spouse name: Donavon Fudge   Number of children: Not on file   Years of education: Not on file   Highest education level: High school graduate  Occupational History   Not on file  Tobacco Use   Smoking status: Never   Smokeless  tobacco: Never  Vaping Use   Vaping status: Never Used  Substance and Sexual Activity   Alcohol use: Yes    Comment: socially   Drug use: No   Sexual activity: Not Currently    Birth control/protection: Post-menopausal  Other Topics Concern   Not on file  Social History Narrative   Lives with husband   Caffeine- coffee 4 c daily   Social Drivers of Corporate investment banker Strain: Not on file  Food Insecurity: Not on file  Transportation Needs: Not on file  Physical Activity: Not on file  Stress: Not on file  Social Connections: Unknown (01/12/2022)   Received from [facility], [facility]   Social Network    Social Network: Not on file  Intimate Partner Violence: Unknown (12/04/2021)   Received [facility], [facility]   HITS    Physically Hurt: Not on file    Insult or Talk Down To: Not on file    Threaten Physical Harm: Not on file    Scream or Curse: Not on file     Review of Systems: General: negative for chills, fever, night sweats or weight changes.  Cardiovascular: negative for chest pain, dyspnea on exertion, edema, orthopnea, palpitations, paroxysmal nocturnal dyspnea or shortness of breath Dermatological: negative for rash Respiratory: negative for cough or wheezing Urologic: negative for hematuria Abdominal: negative for nausea, vomiting, diarrhea, bright red  blood per rectum, melena, or hematemesis Neurologic: negative for visual changes, syncope, or dizziness All other systems reviewed and are otherwise negative except as noted above.    Blood pressure 134/82, pulse 62, height 5\' 6"  (1.676 m), weight 155 lb 3.2 oz (70.4 kg), last menstrual period 07/01/1996, SpO2 97%.  General appearance: alert and no distress Neck: no adenopathy, no carotid bruit, no JVD, supple, symmetrical, trachea midline, and thyroid not enlarged, symmetric, no tenderness/mass/nodules Lungs: clear to auscultation bilaterally Heart: regular rate and rhythm, S1, S2  normal, no murmur, click, rub or gallop Extremities: extremities normal, atraumatic, no cyanosis or edema Pulses: 2+ and symmetric Skin: Skin color, texture, turgor normal. No rashes or lesions Neurologic: Grossly normal  EKG EKG Interpretation Date/Time:  Monday February 07 2024 09:37:57 EDT Ventricular Rate:  58 PR Interval:  130 QRS Duration:  80 QT Interval:  410 QTC Calculation: 402 R Axis:   62  Text Interpretation: Sinus bradycardia Nonspecific ST and T wave abnormality When compared with ECG of 11-Jul-2023 12:31, PREVIOUS ECG IS PRESENT Confirmed by Lauro Portal [phone]) on 02/07/2024 9:55:21 AM    ASSESSMENT AND PLAN:   Syncope and collapse Ms. Shipton was referred to me by her primary care provider Mindi Alto , NP for evaluation of syncope.  She has no cardiovascular risk factors.  The episode occurred in early November of last year while she was in church sitting in her pew.  This was witnessed.  She was taken by EMS to the emergency room where her diagnosis was dehydration.  Workup was unrevealing.  She has had no further symptoms.  Given the fact that she has been asymptomatic for 8 months I do not feel inclined to perform 2D echocardiography or event monitoring at this time.  Should she have a recurrent episode we will pursue a more thorough workup.     Avanell Leigh MD FACP,FACC,FAHA, [facility] 02/07/2024 10:08 AM

## 2024-02-07 NOTE — Patient Instructions (Signed)
 Medication Instructions:  Your physician recommends that you continue on your current medications as directed. Please refer to the Current Medication list given to you today.  *If you need a refill on your cardiac medications before your next appointment, please call your pharmacy*  Follow-Up: At [facility], you and your health needs are our priority.  As part of our continuing mission to provide you with exceptional heart care, our providers are all part of one team.  This team includes your primary Cardiologist (physician) and Advanced Practice Providers or APPs (Physician Assistants and Nurse Practitioners) who all work together to provide you with the care you need, when you need it.  Your next appointment:   6 month(s)  Provider:   Lauro Portal, MD    We recommend signing up for the patient portal called "MyChart".  Sign up information is provided on this After Visit Summary.  MyChart is used to connect with patients for Virtual Visits (Telemedicine).  Patients are able to view lab/test results, encounter notes, upcoming appointments, etc.  Non-urgent messages can be sent to your provider as well.   To learn more about what you can do with MyChart, go to [email or web address].

## 2024-02-15 ENCOUNTER — Ambulatory Visit (INDEPENDENT_AMBULATORY_CARE_PROVIDER_SITE_OTHER): Admitting: Diagnostic Neuroimaging

## 2024-02-15 ENCOUNTER — Encounter: Payer: Self-pay | Admitting: Diagnostic Neuroimaging

## 2024-02-15 VITALS — BP 107/69 | HR 60 | Ht 66.0 in | Wt 153.0 lb

## 2024-02-15 DIAGNOSIS — R55 Syncope and collapse: Secondary | ICD-10-CM | POA: Diagnosis not present

## 2024-02-15 NOTE — Progress Notes (Signed)
 [facility]  PATIENT: Annette Silva DOB: 1961-05-09  REFERRING CLINICIAN: Lenn Quint, NP HISTORY FROM: patient  REASON FOR VISIT: follow up   HISTORICAL  CHIEF COMPLAINT:  Chief Complaint  Patient presents with   Follow-up    Pt alone, rm 6. Here for a follow up. Overall stable. No issues. States she has not had any episodes. EEG was normal that was completed.     HISTORY OF PRESENT ILLNESS:   UPDATE (02/15/24, VRP): Since last visit, doing well. Symptoms are are resolved. No recurrence. Testing and cardiology consult completed.   UPDATE (08/02/23, VRP): Since last visit, doing a little better, until 07/11/23. Had dizzy, hot feeling, then LOC at church while sitting down. Continues with stress issues at home. Started vraylar in Oct 2024. Sleep is better.    UPDATE (12/16/21, VRP): Since last visit, doing well, except had right salivary gland stone and gland removal in Oct 2022. Some post-op numbness in neck and right facial weakness, that has improved. Now having some facial numbness, right to left, in last few weeks.   PRIOR HPI (01/22/20): 63 year old female here for evaluation of post Covid brain fog and memory loss.  Patient had Covid in November 2020.  She had cough shortness of breath fatigue headaches loss of smell and taste, acid reflux and numbness tingling.  Patient had first Covid vaccine in April 2021 and second vaccine last Friday.  Since then symptoms have been stable.  She continues to have insomnia, feeling cold, fatigue, brain fog, loss of smell and taste.  She sleeps about 5 to 6 hours at night.   REVIEW OF SYSTEMS: Full 14 system review of systems performed and negative with exception of: As per HPI.  ALLERGIES: Allergies  Allergen Reactions   Benadryl [Diphenhydramine Hcl (Sleep)] Other (See Comments)    Hyperactivity     HOME MEDICATIONS: Outpatient Medications Prior to Visit  Medication Sig Dispense Refill   acetaminophen   (TYLENOL ) 500 MG tablet Take 1,000 mg by mouth daily as needed for moderate pain (pain score 4-6) or headache.     Calcium Carb-Cholecalciferol (CALCIUM + VITAMIN D3 PO) Take 1 tablet by mouth daily.     ibuprofen (ADVIL) 800 MG tablet Take 800 mg by mouth daily as needed for headache or moderate pain (pain score 4-6).     levothyroxine  (SYNTHROID ) 75 MCG tablet TAKE 1 TABLET ONCE DAILY BEFORE BREAKFAST 90 tablet 2   Multiple Vitamins-Minerals (CENTRUM SILVER WOMEN 50+) TABS Take 1 tablet by mouth daily.     vitamin B-12 (CYANOCOBALAMIN) 100 MCG tablet Take 100 mcg by mouth daily.     No facility-administered medications prior to visit.    PAST MEDICAL HISTORY: Past Medical History:  Diagnosis Date   Allergy    Anxiety    COVID-19 virus infection 07/2019   hx of   Depression    Facial numbness    GERD (gastroesophageal reflux disease)    Mini stroke 12/2013   Salivary stone 05/2021   removed   Salivary stones    Shingles    Thyroid disease     PAST SURGICAL HISTORY: Past Surgical History:  Procedure Laterality Date   ABDOMINAL HYSTERECTOMY  11 / 1997   partial    CESAREAN SECTION  1993   SUBMANDIBULAR GLAND EXCISION Right 06/10/2021   Procedure: EXCISION RIGHT SUBMANDIBULAR GLAND;  Surgeon: Janita Mellow, MD;  Location: [facility] OR;  Service: ENT;  Laterality: Right;   TOOTH EXTRACTION Left  FAMILY HISTORY: Family History  Problem Relation Age of Onset   Stroke Mother        passed at age 62   Arthritis Mother    COPD Mother    Depression Mother    Diabetes Mother    Hearing loss Mother    Heart disease Mother    Hypertension Mother    Dementia Mother    Cancer Father        bone passed at 44    SOCIAL HISTORY: Social History   Socioeconomic History   Marital status: Married    Spouse name: Donavon Fudge   Number of children: Not on file   Years of education: Not on file   Highest education level: High school graduate  Occupational History   Not on file  Tobacco  Use   Smoking status: Never   Smokeless tobacco: Never  Vaping Use   Vaping status: Never Used  Substance and Sexual Activity   Alcohol use: Yes    Comment: socially   Drug use: No   Sexual activity: Not Currently    Birth control/protection: Post-menopausal  Other Topics Concern   Not on file  Social History Narrative   Lives with husband   Caffeine- coffee 4 c daily   Social Drivers of Corporate investment banker Strain: Not on file  Food Insecurity: Not on file  Transportation Needs: Not on file  Physical Activity: Not on file  Stress: Not on file  Social Connections: Unknown (01/12/2022)   [facility]   Social Network    Social Network: Not on file  Intimate Partner Violence: Unknown (12/04/2021)   Received from [facility]   HITS    Physically Hurt: Not on file    Insult or Talk Down To: Not on file    Threaten Physical Harm: Not on file    Scream or Curse: Not on file     PHYSICAL EXAM  GENERAL EXAM/CONSTITUTIONAL: Vitals:  Vitals:   02/15/24 1524  BP: 107/69  Pulse: 60  Weight: 153 lb (69.4 kg)  Height: 5' 6 (1.676 m)   Body mass index is 24.69 kg/m. Wt Readings from Last 3 Encounters:  02/15/24 153 lb (69.4 kg)  02/07/24 155 lb 3.2 oz (70.4 kg)  08/02/23 149 lb 14.4 oz (68 kg)   Patient is in no distress; well developed, nourished and groomed; neck is supple  CARDIOVASCULAR: Examination of carotid arteries is normal; no carotid bruits Regular rate and rhythm, no murmurs Examination of peripheral vascular system by observation and palpation is normal  EYES: Ophthalmoscopic exam of optic discs and posterior segments is normal; no papilledema or hemorrhages No results found.  MUSCULOSKELETAL: Gait, strength, tone, movements noted in Neurologic exam below  NEUROLOGIC: MENTAL STATUS:     01/22/2020    8:18 AM  MMSE - Mini Mental State Exam  Orientation to time 5  Orientation to Place 4  Registration 3  Attention/  Calculation 3  Recall 2  Language- name 2 objects 2  Language- repeat 1  Language- follow 3 step command 3  Language- read & follow direction 1  Write a sentence 1  Copy design 1  Total score 26   awake, alert, oriented to person, place and time recent and remote memory intact normal attention and concentration language fluent, comprehension intact, naming intact fund of knowledge appropriate  CRANIAL NERVE:  2nd - no papilledema on fundoscopic exam 2nd, 3rd, 4th, 6th - pupils equal and reactive to  light, visual fields full to confrontation, extraocular muscles intact, no nystagmus 5th - facial sensation symmetric 7th - facial strength symmetric 8th - hearing intact 9th - palate elevates symmetrically, uvula midline 11th - shoulder shrug symmetric 12th - tongue protrusion midline  MOTOR:  normal bulk and tone, full strength in the BUE, BLE  SENSORY:  normal and symmetric to light touch, temperature, vibration  COORDINATION:  finger-nose-finger, fine finger movements normal  REFLEXES:  deep tendon reflexes TRACE present and symmetric  GAIT/STATION:  narrow based gait    DIAGNOSTIC DATA (LABS, IMAGING, TESTING) - I reviewed patient records, labs, notes, testing and imaging myself where available.  Lab Results  Component Value Date   WBC 4.0 07/11/2023   HGB 13.1 07/11/2023   HCT 38.9 07/11/2023   MCV 87.0 07/11/2023   PLT 248 07/11/2023      Component Value Date/Time   NA 138 07/11/2023 1300   NA 148 (H) 12/16/2021 1540   K 3.8 07/11/2023 1300   CL 103 07/11/2023 1300   CO2 27 07/11/2023 1300   GLUCOSE 107 (H) 07/11/2023 1300   BUN 15 07/11/2023 1300   BUN 13 12/16/2021 1540   CREATININE 1.00 07/11/2023 1300   CALCIUM 9.1 07/11/2023 1300   PROT 6.8 12/16/2021 1540   ALBUMIN 4.7 12/16/2021 1540   AST 22 12/16/2021 1540   ALT 14 12/16/2021 1540   ALKPHOS 95 12/16/2021 1540   BILITOT 0.4 12/16/2021 1540   GFRNONAA >60 07/11/2023 1300   GFRAA 76  09/10/2020 1609   Lab Results  Component Value Date   CHOL 210 (H) 09/10/2020   HDL 75 09/10/2020   LDLCALC 119 (H) 09/10/2020   TRIG 88 09/10/2020   CHOLHDL 2.8 09/10/2020   Lab Results  Component Value Date   HGBA1C 5.3 12/16/2021   Lab Results  Component Value Date   VITAMINB12 642 12/16/2021   Lab Results  Component Value Date   TSH 2.080 12/16/2021   08/26/23 MRI brain MRI of the brain with and without contrast shows the following: No acute findings.  Normal enhancement pattern. The brain appears normal for age with just a couple punctate T2/FLAIR hyperintense foci consistent with very minimal chronic microvascular ischemic change. Incidental note of diminutive vertebral and basilar arteries associated with fetal origins of the posterior cerebral arteries, a normal variant.  09/08/23 EEG - Normal EEG in awake and drowsy states.    ASSESSMENT AND PLAN  63 y.o. year old female here with post Covid symptoms of brain fog, loss of smell and taste, headaches, insomnia, feeling cold since November 2020.  Dx:  1. Syncope, unspecified syncope type     PLAN:  SYNCOPE on 07/11/23 (in seated position; no recurrence) - follow up with PCP; may return to driving; no recurrence in > 6 months  FACIAL NUMBNESS - unclear etiology; normal neuro exam; some mild anxiety issues  POST-COVID SYNDROME (fatigue, brain fog, numbness, loss of smell / taste) - supportive care; brain and physical activities reviewed; sleep and nutrition reviewed  Return for pending if symptoms worsen or fail to improve, return to PCP.    Omega Bible, MD 02/15/2024, 3:49 PM Certified in Neurology, Neurophysiology and Neuroimaging  [facility] [address] [place], [place] [address] [phone]

## 2024-06-21 ENCOUNTER — Other Ambulatory Visit: Payer: Self-pay | Admitting: Obstetrics and Gynecology

## 2024-06-21 DIAGNOSIS — Z1231 Encounter for screening mammogram for malignant neoplasm of breast: Secondary | ICD-10-CM

## 2024-07-12 ENCOUNTER — Ambulatory Visit

## 2024-08-08 ENCOUNTER — Ambulatory Visit

## 2024-09-05 ENCOUNTER — Ambulatory Visit
Admission: RE | Admit: 2024-09-05 | Discharge: 2024-09-05 | Disposition: A | Discharge status: Home / Self Care | Source: Ambulatory Visit | Attending: Obstetrics and Gynecology | Admitting: Obstetrics and Gynecology

## 2024-09-05 DIAGNOSIS — Z1231 Encounter for screening mammogram for malignant neoplasm of breast: Secondary | ICD-10-CM

## 2024-09-08 ENCOUNTER — Ambulatory Visit: Payer: Self-pay | Admitting: Obstetrics and Gynecology

## 2024-09-19 ENCOUNTER — Ambulatory Visit: Admitting: Cardiovascular Disease

## 2024-09-25 ENCOUNTER — Ambulatory Visit: Admitting: Cardiovascular Disease

## 2024-10-04 ENCOUNTER — Ambulatory Visit (HOSPITAL_COMMUNITY)
Admission: RE | Admit: 2024-10-04 | Discharge: 2024-10-04 | Disposition: A | Payer: Self-pay | Source: Ambulatory Visit | Attending: Cardiovascular Disease | Admitting: Cardiovascular Disease

## 2024-10-04 ENCOUNTER — Encounter: Payer: Self-pay | Admitting: Cardiovascular Disease

## 2024-10-04 ENCOUNTER — Ambulatory Visit: Admitting: Cardiovascular Disease

## 2024-10-04 VITALS — BP 104/58 | Ht 66.0 in | Wt 159.9 lb

## 2024-10-04 DIAGNOSIS — Z136 Encounter for screening for cardiovascular disorders: Secondary | ICD-10-CM

## 2024-10-04 DIAGNOSIS — R55 Syncope and collapse: Secondary | ICD-10-CM | POA: Diagnosis not present

## 2024-10-04 DIAGNOSIS — E785 Hyperlipidemia, unspecified: Secondary | ICD-10-CM | POA: Insufficient documentation

## 2024-10-04 DIAGNOSIS — R079 Chest pain, unspecified: Secondary | ICD-10-CM | POA: Diagnosis not present

## 2024-10-04 DIAGNOSIS — E782 Mixed hyperlipidemia: Secondary | ICD-10-CM

## 2024-10-04 NOTE — Assessment & Plan Note (Signed)
 1 episode of syncope November 2024 while standing in a church pew.  She had no prior episodes or subsequent episodes.

## 2024-10-04 NOTE — Patient Instructions (Signed)
 Medication Instructions:  Your physician recommends that you continue on your current medications as directed. Please refer to the Current Medication list given to you today.  *If you need a refill on your cardiac medications before your next appointment, please call your pharmacy*  Testing/Procedures: Dr. Court has ordered a CT coronary calcium  score.   Test locations:  [facility] at [facility] [place] [facility]  [facility] at [facility]  This is $99 out of pocket.   Coronary CalciumScan A coronary calcium  scan is an imaging test used to look for deposits of calcium  and other fatty materials (plaques) in the inner lining of the blood vessels of the heart (coronary arteries). These deposits of calcium  and plaques can partly clog and narrow the coronary arteries without producing any symptoms or warning signs. This puts a person at risk for a heart attack. This test can detect these deposits before symptoms develop. Tell a health care provider about: Any allergies you have. All medicines you are taking, including vitamins, herbs, eye drops, creams, and over-the-counter medicines. Any problems you or family members have had with anesthetic medicines. Any blood disorders you have. Any surgeries you have had. Any medical conditions you have. Whether you are pregnant or may be pregnant. What are the risks? Generally, this is a safe procedure. However, problems may occur, including: Harm to a pregnant woman and her unborn baby. This test involves the use of radiation. Radiation exposure can be dangerous to a pregnant woman and her unborn baby. If you are pregnant, you generally should not have this procedure done. Slight increase in the risk of cancer. This is because of the radiation involved in the test. What happens before the procedure? No preparation is needed for this procedure. What happens  during the procedure? You will undress and remove any jewelry around your neck or chest. You will put on a hospital gown. Sticky electrodes will be placed on your chest. The electrodes will be connected to an electrocardiogram (ECG) machine to record a tracing of the electrical activity of your heart. A CT scanner will take pictures of your heart. During this time, you will be asked to lie still and hold your breath for 2-3 seconds while a picture of your heart is being taken. The procedure may vary among health care providers and hospitals. What happens after the procedure? You can get dressed. You can return to your normal activities. It is up to you to get the results of your test. Ask your health care provider, or the department that is doing the test, when your results will be ready. Summary A coronary calcium  scan is an imaging test used to look for deposits of calcium  and other fatty materials (plaques) in the inner lining of the blood vessels of the heart (coronary arteries). Generally, this is a safe procedure. Tell your health care provider if you are pregnant or may be pregnant. No preparation is needed for this procedure. A CT scanner will take pictures of your heart. You can return to your normal activities after the scan is done. This information is not intended to replace advice given to you by your health care provider. Make sure you discuss any questions you have with your health care provider. Document Released: 02/13/2008 Document Revised: 07/06/2016 Document Reviewed: 07/06/2016 Elsevier Interactive Patient Education  2017 [employer].   Follow-Up: At [facility], you and your health needs are our priority.  As  part of our continuing mission to provide you with exceptional heart care, our providers are all part of one team.  This team includes your primary Cardiologist (physician) and Advanced Practice Providers or APPs (Physician Assistants and Nurse  Practitioners) who all work together to provide you with the care you need, when you need it.  Your next appointment:   We will see you on an as needed basis.    Provider:   Dorn Lesches, MD   We recommend signing up for the patient portal called MyChart.  Sign up information is provided on this After Visit Summary.  MyChart is used to connect with patients for Virtual Visits (Telemedicine).  Patients are able to view lab/test results, encounter notes, upcoming appointments, etc.  Non-urgent messages can be sent to your provider as well.   To learn more about what you can do with MyChart, go to [email or web address].   Other Instructions

## 2024-10-04 NOTE — Assessment & Plan Note (Signed)
 Patient had 1 episode of chest pain around Christmas of 2025 while standing in the bank line lasting approxi-1 minute.  That was her only episode.  She really has no cardiac risk factors.  I am going to get a coronary calcium score to risk stratify.

## 2024-10-04 NOTE — Assessment & Plan Note (Signed)
 Mild hyperlipidemia with lipid profile performed 06/19/2024 revealing total cholesterol 206, LDL 125 and HDL of 66.  Go to get a coronary calcium score to restratify.

## 2024-10-04 NOTE — Progress Notes (Signed)
 "     10/04/2024 Annette Silva   [birth year]  [id number]  Primary Physician Renato Dorothey HERO, NP Primary Cardiologist: Dorn JINNY Lesches MD [facility] [place], [place]  HPI:  Annette Silva is a 64 y.o.  thin and fit appearing married Caucasian female mother of 1 child, grandmother of 8 grandchildren who still works as a diplomatic services operational officer.  She was referred by her primary care provider, Dorothey Renato, NP, for evaluation of syncope.  I last saw her in the office 02/07/2024.  She had an episode of syncope in November 2024 while in church standing in a pew.  That was her only episode of syncope.  She was seen by Dr. Margaret , neurology, who released her to drive after 6 months.  She has no cardiovascular risk factors.  Is a question of TIA back in 2001.  Has never had a heart attack.  There is no family history.  She denies chest pain or shortness of breath.  She does workout Silver sneakers.  She currently had COVID and has a diagnosis of long COVID manifested as fatigue and brain fog.  She has been evaluated by neurology, Dr. Margaret who performed EEG testing that was unrevealing.  She has had no recurrent symptoms.  Since I saw her 8 months ago she has remained stable.  She did have 1 episode of chest pain around Christmas of last year while standing in a bank line that lasted proxy 1 minute.  That was her only episode.   Active Medications[1]   Allergies[2]  Social History   Socioeconomic History   Marital status: Married    Spouse name: Deward   Number of children: Not on file   Years of education: Not on file   Highest education level: High school graduate  Occupational History   Not on file  Tobacco Use   Smoking status: Never   Smokeless tobacco: Never  Vaping Use   Vaping status: Never Used  Substance and Sexual Activity   Alcohol use: Yes    Comment: socially   Drug use: No   Sexual activity: Not Currently    Birth control/protection: Post-menopausal  Other Topics Concern    Not on file  Social History Narrative   Lives with husband   Caffeine- coffee 4 c daily   Social Drivers of Health   Tobacco Use: Low Risk (10/04/2024)   Patient History    Smoking Tobacco Use: Never    Smokeless Tobacco Use: Never    Passive Exposure: Not on file  Financial Resource Strain: Not on file  Food Insecurity: Not on file  Transportation Needs: Not on file  Physical Activity: Not on file  Stress: Not on file  Social Connections: Unknown (01/12/2022)   Received from [facility]   Social Network    Social Network: Not on file  Intimate Partner Violence: Unknown (12/04/2021)   Received from [facility]   HITS    Physically Hurt: Not on file    Insult or Talk Down To: Not on file    Threaten Physical Harm: Not on file    Scream or Curse: Not on file  Depression (EYV7-0): Not on file  Alcohol Screen: Not on file  Housing: Not on file  Utilities: Not on file  Health Literacy: Not on file     Review of Systems: General: negative for chills, fever, night sweats or weight changes.  Cardiovascular: negative for chest pain, dyspnea on exertion, edema, orthopnea, palpitations, paroxysmal nocturnal dyspnea or  shortness of breath Dermatological: negative for rash Respiratory: negative for cough or wheezing Urologic: negative for hematuria Abdominal: negative for nausea, vomiting, diarrhea, bright red blood per rectum, melena, or hematemesis Neurologic: negative for visual changes, syncope, or dizziness All other systems reviewed and are otherwise negative except as noted above.    Blood pressure (!) 104/58, height 5' 6 (1.676 m), weight 159 lb 14.4 oz (72.5 kg), last menstrual period 07/01/1996, SpO2 97%.  General appearance: alert and no distress Neck: no adenopathy, no carotid bruit, no JVD, supple, symmetrical, trachea midline, and thyroid not enlarged, symmetric, no tenderness/mass/nodules Lungs: clear to auscultation bilaterally Heart: regular rate and rhythm,  S1, S2 normal, no murmur, click, rub or gallop Extremities: extremities normal, atraumatic, no cyanosis or edema Pulses: 2+ and symmetric Skin: Skin color, texture, turgor normal. No rashes or lesions Neurologic: Grossly normal  EKG EKG Interpretation Date/Time:  Wednesday October 04 2024 14:41:40 EST Ventricular Rate:  67 PR Interval:  138 QRS Duration:  84 QT Interval:  410 QTC Calculation: 433 R Axis:   44  Text Interpretation: Normal sinus rhythm Nonspecific ST abnormality When compared with ECG of 07-Feb-2024 09:37, No significant change was found Confirmed by Court Carrier [phone]) on 10/04/2024 3:00:38 PM    ASSESSMENT AND PLAN:   Syncope and collapse 1 episode of syncope November 2024 while standing in a church pew.  She had no prior episodes or subsequent episodes.  Chest pain of uncertain etiology Patient had 1 episode of chest pain around Christmas of 2025 while standing in the bank line lasting approxi-1 minute.  That was her only episode.  She really has no cardiac risk factors.  I am going to get a coronary calcium score to risk stratify.  Hyperlipidemia Mild hyperlipidemia with lipid profile performed 06/19/2024 revealing total cholesterol 206, LDL 125 and HDL of 66.  Go to get a coronary calcium score to restratify.     Carrier DOROTHA Court MD FACP,FACC,FAHA, FSCAI 10/04/2024 3:12 PM    [1]  Current Meds  Medication Sig   acetaminophen  (TYLENOL ) 500 MG tablet Take 1,000 mg by mouth daily as needed for moderate pain (pain score 4-6) or headache.   Calcium Carb-Cholecalciferol (CALCIUM + VITAMIN D3 PO) Take 1 tablet by mouth daily.   ibuprofen (ADVIL) 800 MG tablet Take 800 mg by mouth daily as needed for headache or moderate pain (pain score 4-6).   levothyroxine  (SYNTHROID ) 75 MCG tablet TAKE 1 TABLET ONCE DAILY BEFORE BREAKFAST   Multiple Vitamins-Minerals (CENTRUM SILVER WOMEN 50+) TABS Take 1 tablet by mouth daily.   vitamin B-12 (CYANOCOBALAMIN) 100 MCG  tablet Take 100 mcg by mouth daily.  [2]  Allergies Allergen Reactions   Benadryl [Diphenhydramine Hcl (Sleep)] Other (See Comments)    Hyperactivity    "

## 2024-10-06 ENCOUNTER — Ambulatory Visit: Payer: Self-pay | Admitting: Cardiovascular Disease
